# Patient Record
Sex: Male | Born: 1945 | Race: White | Hispanic: No | Marital: Married | Smoking: Current every day smoker
Health system: Southern US, Community
[De-identification: ages and names within clinical notes are randomized; demographics above are authoritative.]

## PROBLEM LIST (undated history)

## (undated) DIAGNOSIS — C9000 Multiple myeloma not having achieved remission: Secondary | ICD-10-CM

## (undated) DIAGNOSIS — I10 Essential (primary) hypertension: Secondary | ICD-10-CM

## (undated) DIAGNOSIS — I82409 Acute embolism and thrombosis of unspecified deep veins of unspecified lower extremity: Secondary | ICD-10-CM

## (undated) DIAGNOSIS — C801 Malignant (primary) neoplasm, unspecified: Secondary | ICD-10-CM

## (undated) DIAGNOSIS — E119 Type 2 diabetes mellitus without complications: Secondary | ICD-10-CM

## (undated) DIAGNOSIS — C9030 Solitary plasmacytoma not having achieved remission: Secondary | ICD-10-CM

## (undated) DIAGNOSIS — J449 Chronic obstructive pulmonary disease, unspecified: Secondary | ICD-10-CM

## (undated) HISTORY — PX: LIMBAL STEM CELL TRANSPLANT: SHX1969

## (undated) HISTORY — PX: TONSILLECTOMY: SUR1361

## (undated) HISTORY — DX: Chronic obstructive pulmonary disease, unspecified: J44.9

## (undated) HISTORY — DX: Solitary plasmacytoma not having achieved remission: C90.30

## (undated) HISTORY — PX: OTHER SURGICAL HISTORY: SHX169

## (undated) HISTORY — DX: Multiple myeloma not having achieved remission: C90.00

## (undated) HISTORY — DX: Acute embolism and thrombosis of unspecified deep veins of unspecified lower extremity: I82.409

## (undated) HISTORY — DX: Type 2 diabetes mellitus without complications: E11.9

## (undated) HISTORY — DX: Essential (primary) hypertension: I10

---

## 2002-02-10 DIAGNOSIS — C9030 Solitary plasmacytoma not having achieved remission: Secondary | ICD-10-CM

## 2002-02-10 HISTORY — DX: Solitary plasmacytoma not having achieved remission: C90.30

## 2006-11-11 ENCOUNTER — Ambulatory Visit: Payer: Self-pay | Admitting: Oncology

## 2006-12-10 ENCOUNTER — Ambulatory Visit: Payer: Self-pay | Admitting: Oncology

## 2006-12-12 ENCOUNTER — Ambulatory Visit: Payer: Self-pay | Admitting: Oncology

## 2007-03-14 ENCOUNTER — Ambulatory Visit: Payer: Self-pay | Admitting: Oncology

## 2007-04-11 ENCOUNTER — Ambulatory Visit: Payer: Self-pay | Admitting: Oncology

## 2007-10-06 ENCOUNTER — Ambulatory Visit: Payer: Self-pay | Admitting: Oncology

## 2007-10-12 ENCOUNTER — Ambulatory Visit: Payer: Self-pay | Admitting: Oncology

## 2008-03-13 ENCOUNTER — Ambulatory Visit: Payer: Self-pay | Admitting: Oncology

## 2008-03-29 ENCOUNTER — Ambulatory Visit: Payer: Self-pay | Admitting: Oncology

## 2008-04-10 ENCOUNTER — Ambulatory Visit: Payer: Self-pay | Admitting: Oncology

## 2008-08-10 ENCOUNTER — Ambulatory Visit: Payer: Self-pay | Admitting: Oncology

## 2008-08-28 ENCOUNTER — Ambulatory Visit: Payer: Self-pay | Admitting: Oncology

## 2008-09-10 ENCOUNTER — Ambulatory Visit: Payer: Self-pay | Admitting: Oncology

## 2009-02-11 LAB — HM COLONOSCOPY: HM COLON: NORMAL

## 2009-07-11 ENCOUNTER — Ambulatory Visit: Payer: Self-pay | Admitting: Oncology

## 2009-07-20 ENCOUNTER — Ambulatory Visit: Payer: Self-pay | Admitting: Family Medicine

## 2009-07-23 ENCOUNTER — Ambulatory Visit: Payer: Self-pay | Admitting: Gastroenterology

## 2009-07-26 ENCOUNTER — Ambulatory Visit: Payer: Self-pay | Admitting: Oncology

## 2009-07-31 ENCOUNTER — Inpatient Hospital Stay: Payer: Self-pay | Admitting: Oncology

## 2009-08-06 ENCOUNTER — Inpatient Hospital Stay: Payer: Self-pay | Admitting: Oncology

## 2009-08-10 ENCOUNTER — Ambulatory Visit: Payer: Self-pay | Admitting: Oncology

## 2009-08-29 LAB — PATHOLOGY REPORT

## 2009-09-03 ENCOUNTER — Ambulatory Visit: Payer: Self-pay | Admitting: Cardiovascular Disease

## 2009-09-03 ENCOUNTER — Inpatient Hospital Stay: Payer: Self-pay | Admitting: Oncology

## 2009-09-10 ENCOUNTER — Ambulatory Visit: Payer: Self-pay | Admitting: Oncology

## 2009-09-14 ENCOUNTER — Ambulatory Visit: Payer: Self-pay | Admitting: Oncology

## 2009-09-23 ENCOUNTER — Inpatient Hospital Stay: Payer: Self-pay | Admitting: Oncology

## 2009-10-03 ENCOUNTER — Inpatient Hospital Stay: Payer: Self-pay | Admitting: Internal Medicine

## 2009-10-03 ENCOUNTER — Ambulatory Visit: Payer: Self-pay | Admitting: Cardiovascular Disease

## 2009-10-10 ENCOUNTER — Ambulatory Visit: Payer: Self-pay | Admitting: Cardiovascular Disease

## 2009-10-11 ENCOUNTER — Ambulatory Visit: Payer: Self-pay | Admitting: Internal Medicine

## 2009-10-11 ENCOUNTER — Ambulatory Visit: Payer: Self-pay | Admitting: Oncology

## 2009-10-17 ENCOUNTER — Inpatient Hospital Stay: Payer: Self-pay | Admitting: Internal Medicine

## 2009-11-02 ENCOUNTER — Emergency Department: Payer: Self-pay | Admitting: Emergency Medicine

## 2009-11-10 ENCOUNTER — Ambulatory Visit: Payer: Self-pay | Admitting: Oncology

## 2009-11-10 ENCOUNTER — Ambulatory Visit: Payer: Self-pay | Admitting: Internal Medicine

## 2009-11-19 ENCOUNTER — Ambulatory Visit: Payer: Self-pay | Admitting: Oncology

## 2009-12-11 ENCOUNTER — Ambulatory Visit: Payer: Self-pay | Admitting: Oncology

## 2010-01-10 ENCOUNTER — Ambulatory Visit: Payer: Self-pay | Admitting: Oncology

## 2010-02-10 ENCOUNTER — Ambulatory Visit: Payer: Self-pay | Admitting: Oncology

## 2010-02-10 HISTORY — PX: OTHER SURGICAL HISTORY: SHX169

## 2010-02-10 HISTORY — PX: ILEOSTOMY CLOSURE: SHX1784

## 2010-03-13 ENCOUNTER — Ambulatory Visit: Payer: Self-pay | Admitting: Oncology

## 2010-04-11 ENCOUNTER — Ambulatory Visit: Payer: Self-pay | Admitting: Oncology

## 2010-04-25 ENCOUNTER — Inpatient Hospital Stay: Payer: Self-pay | Admitting: Internal Medicine

## 2010-05-12 ENCOUNTER — Ambulatory Visit: Payer: Self-pay | Admitting: Oncology

## 2010-05-17 ENCOUNTER — Inpatient Hospital Stay: Payer: Self-pay | Admitting: Surgery

## 2010-05-30 ENCOUNTER — Emergency Department: Payer: Self-pay | Admitting: *Deleted

## 2010-06-11 ENCOUNTER — Ambulatory Visit: Payer: Self-pay | Admitting: Oncology

## 2010-07-12 ENCOUNTER — Ambulatory Visit: Payer: Self-pay | Admitting: Oncology

## 2010-07-18 ENCOUNTER — Ambulatory Visit: Payer: Self-pay | Admitting: Surgery

## 2010-07-25 ENCOUNTER — Inpatient Hospital Stay: Payer: Self-pay | Admitting: Surgery

## 2010-07-29 LAB — PATHOLOGY REPORT

## 2010-08-02 ENCOUNTER — Other Ambulatory Visit: Payer: Self-pay | Admitting: Surgery

## 2010-08-05 ENCOUNTER — Ambulatory Visit: Payer: Self-pay | Admitting: Surgery

## 2010-08-07 ENCOUNTER — Ambulatory Visit: Payer: Self-pay | Admitting: Surgery

## 2010-08-11 ENCOUNTER — Ambulatory Visit: Payer: Self-pay | Admitting: Oncology

## 2010-09-11 ENCOUNTER — Ambulatory Visit: Payer: Self-pay | Admitting: Oncology

## 2010-10-09 ENCOUNTER — Other Ambulatory Visit: Payer: Self-pay | Admitting: Neurosurgery

## 2010-10-09 ENCOUNTER — Ambulatory Visit
Admission: RE | Admit: 2010-10-09 | Discharge: 2010-10-09 | Disposition: A | Discharge status: Home / Self Care | Payer: Medicare Other | Source: Ambulatory Visit | Attending: Neurosurgery | Admitting: Neurosurgery

## 2010-10-09 DIAGNOSIS — M549 Dorsalgia, unspecified: Secondary | ICD-10-CM

## 2010-10-12 ENCOUNTER — Ambulatory Visit: Payer: Self-pay | Admitting: Oncology

## 2010-11-11 ENCOUNTER — Ambulatory Visit: Payer: Self-pay | Admitting: Oncology

## 2010-12-12 ENCOUNTER — Ambulatory Visit: Payer: Self-pay | Admitting: Oncology

## 2011-01-11 ENCOUNTER — Ambulatory Visit: Payer: Self-pay | Admitting: Oncology

## 2011-02-09 ENCOUNTER — Inpatient Hospital Stay: Payer: Self-pay | Admitting: Surgery

## 2011-02-11 ENCOUNTER — Ambulatory Visit: Payer: Self-pay | Admitting: Oncology

## 2011-02-11 LAB — BASIC METABOLIC PANEL
BUN: 20 mg/dL — ABNORMAL HIGH (ref 7–18)
Creatinine: 1.02 mg/dL (ref 0.60–1.30)
EGFR (Non-African Amer.): >60
Sodium: 141 mmol/L (ref 136–145)

## 2011-02-11 LAB — PROTIME-INR: INR: 1.6

## 2011-02-11 LAB — CBC WITH DIFFERENTIAL/PLATELET
Bands: 20 %
Basophil #: 0 10*3/uL (ref 0.0–0.1)
Eosinophil #: 0 10*3/uL (ref 0.0–0.7)
Eosinophil: 1 %
HCT: 30.3 % — ABNORMAL LOW (ref 40.0–52.0)
Lymphocyte #: 1.5 10*3/uL (ref 1.0–3.6)
MCHC: 32.4 g/dL (ref 32.0–36.0)
MCV: 99 fL (ref 80–100)
Monocytes: 21 %
Neutrophil #: 1.5 10*3/uL (ref 1.4–6.5)
Neutrophil %: 38.3 %
RBC: 3.06 10*6/uL — ABNORMAL LOW (ref 4.40–5.90)
RDW: 18.1 % — ABNORMAL HIGH (ref 11.5–14.5)
Segmented Neutrophils: 16 %

## 2011-02-14 LAB — PROTIME-INR
INR: 2.4
Prothrombin Time: 25.6 secs — ABNORMAL HIGH (ref 11.5–14.7)

## 2011-02-21 LAB — PROTIME-INR: Prothrombin Time: 22.3 secs — ABNORMAL HIGH (ref 11.5–14.7)

## 2011-03-07 LAB — CBC CANCER CENTER
Basophil #: 0 x10 3/mm (ref 0.0–0.1)
Eosinophil %: 12 %
HCT: 33.1 % — ABNORMAL LOW (ref 40.0–52.0)
HGB: 11 g/dL — ABNORMAL LOW (ref 13.0–18.0)
Lymphocyte #: 1.7 x10 3/mm (ref 1.0–3.6)
Lymphocyte %: 28.3 %
MCV: 98 fL (ref 80–100)
Monocyte %: 7.8 %
Neutrophil #: 3.1 x10 3/mm (ref 1.4–6.5)
RBC: 3.37 10*6/uL — ABNORMAL LOW (ref 4.40–5.90)
RDW: 18 % — ABNORMAL HIGH (ref 11.5–14.5)
WBC: 6 x10 3/mm (ref 3.8–10.6)

## 2011-03-07 LAB — BASIC METABOLIC PANEL
Calcium, Total: 9 mg/dL (ref 8.5–10.1)
Creatinine: 0.82 mg/dL (ref 0.60–1.30)
EGFR (African American): >60
EGFR (Non-African Amer.): >60
Glucose: 103 mg/dL — ABNORMAL HIGH (ref 65–99)
Osmolality: 285 (ref 275–301)
Potassium: 3.3 mmol/L — ABNORMAL LOW (ref 3.5–5.1)
Sodium: 143 mmol/L (ref 136–145)

## 2011-03-10 LAB — PROT IMMUNOELECTROPHORES(ARMC)

## 2011-03-11 ENCOUNTER — Ambulatory Visit: Payer: Self-pay | Admitting: Surgery

## 2011-03-14 ENCOUNTER — Ambulatory Visit: Payer: Self-pay | Admitting: Oncology

## 2011-03-14 LAB — PROTIME-INR
INR: 2.2
Prothrombin Time: 25 secs — ABNORMAL HIGH (ref 11.5–14.7)

## 2011-03-21 LAB — PROTIME-INR
INR: 2.6
Prothrombin Time: 28.3 secs — ABNORMAL HIGH (ref 11.5–14.7)

## 2011-03-28 LAB — PROTIME-INR: INR: 2.4

## 2011-04-04 LAB — CBC CANCER CENTER
Basophil #: 0 x10 3/mm (ref 0.0–0.1)
Eosinophil #: 0.2 x10 3/mm (ref 0.0–0.7)
Eosinophil %: 4.2 %
HCT: 32.6 % — ABNORMAL LOW (ref 40.0–52.0)
HGB: 11 g/dL — ABNORMAL LOW (ref 13.0–18.0)
Lymphocyte #: 1.9 x10 3/mm (ref 1.0–3.6)
MCH: 33 pg (ref 26.0–34.0)
MCHC: 33.9 g/dL (ref 32.0–36.0)
MCV: 97 fL (ref 80–100)
Monocyte #: 0.4 x10 3/mm (ref 0.0–0.7)
Neutrophil #: 3.2 x10 3/mm (ref 1.4–6.5)
Neutrophil %: 56 %
RDW: 18.7 % — ABNORMAL HIGH (ref 11.5–14.5)

## 2011-04-04 LAB — BASIC METABOLIC PANEL
BUN: 17 mg/dL (ref 7–18)
Calcium, Total: 8.2 mg/dL — ABNORMAL LOW (ref 8.5–10.1)
Chloride: 102 mmol/L (ref 98–107)
Co2: 29 mmol/L (ref 21–32)
Creatinine: 1.04 mg/dL (ref 0.60–1.30)
Glucose: 141 mg/dL — ABNORMAL HIGH (ref 65–99)
Osmolality: 289 (ref 275–301)
Potassium: 3.4 mmol/L — ABNORMAL LOW (ref 3.5–5.1)
Sodium: 143 mmol/L (ref 136–145)

## 2011-04-04 LAB — PROTIME-INR
INR: 2.5
Prothrombin Time: 27.2 secs — ABNORMAL HIGH (ref 11.5–14.7)

## 2011-04-11 ENCOUNTER — Ambulatory Visit: Payer: Self-pay | Admitting: Oncology

## 2011-04-11 LAB — PROTIME-INR
INR: 1.6
Prothrombin Time: 19.1 secs — ABNORMAL HIGH (ref 11.5–14.7)

## 2011-04-18 LAB — PROTIME-INR: Prothrombin Time: 16.8 secs — ABNORMAL HIGH (ref 11.5–14.7)

## 2011-04-25 LAB — PROTIME-INR: INR: 1.6

## 2011-05-02 LAB — CBC CANCER CENTER
Bands: 10 %
Basophil #: 0 x10 3/mm (ref 0.0–0.1)
Basophil: 1 %
HCT: 32 % — ABNORMAL LOW (ref 40.0–52.0)
Lymphocyte #: 1.9 x10 3/mm (ref 1.0–3.6)
MCH: 33.4 pg (ref 26.0–34.0)
MCHC: 33.6 g/dL (ref 32.0–36.0)
MCV: 99 fL (ref 80–100)
Metamyelocyte: 2 %
Monocyte %: 7.1 %
Monocytes: 5 %
Neutrophil #: 2.5 x10 3/mm (ref 1.4–6.5)
Neutrophil %: 52 %
RDW: 18.7 % — ABNORMAL HIGH (ref 11.5–14.5)
Segmented Neutrophils: 38 %
WBC: 4.8 x10 3/mm (ref 3.8–10.6)

## 2011-05-02 LAB — BASIC METABOLIC PANEL
Anion Gap: 7 (ref 7–16)
BUN: 17 mg/dL (ref 7–18)
Chloride: 102 mmol/L (ref 98–107)
EGFR (African American): >60
EGFR (Non-African Amer.): >60
Glucose: 169 mg/dL — ABNORMAL HIGH (ref 65–99)
Osmolality: 285 (ref 275–301)
Potassium: 3.2 mmol/L — ABNORMAL LOW (ref 3.5–5.1)

## 2011-05-02 LAB — PROTIME-INR: INR: 1.7

## 2011-05-05 LAB — PROT IMMUNOELECTROPHORES(ARMC)

## 2011-05-12 ENCOUNTER — Ambulatory Visit: Payer: Self-pay | Admitting: Oncology

## 2011-05-16 LAB — CBC CANCER CENTER
Basophil #: 0 x10 3/mm (ref 0.0–0.1)
Basophil %: 0.2 %
Eosinophil #: 0.3 x10 3/mm (ref 0.0–0.7)
HCT: 35 % — ABNORMAL LOW (ref 40.0–52.0)
Lymphocyte #: 1.6 x10 3/mm (ref 1.0–3.6)
Lymphocyte %: 28.6 %
MCHC: 34.5 g/dL (ref 32.0–36.0)
MCV: 99 fL (ref 80–100)
Monocyte #: 0.5 x10 3/mm (ref 0.0–0.7)
Monocyte %: 9 %
Neutrophil %: 57.5 %
Platelet: 96 x10 3/mm — ABNORMAL LOW (ref 150–440)
RBC: 3.55 10*6/uL — ABNORMAL LOW (ref 4.40–5.90)
WBC: 5.4 x10 3/mm (ref 3.8–10.6)

## 2011-05-16 LAB — COMPREHENSIVE METABOLIC PANEL
Anion Gap: 13 (ref 7–16)
BUN: 16 mg/dL (ref 7–18)
Bilirubin,Total: 0.6 mg/dL (ref 0.2–1.0)
Calcium, Total: 8.6 mg/dL (ref 8.5–10.1)
Co2: 26 mmol/L (ref 21–32)
EGFR (Non-African Amer.): >60
Glucose: 334 mg/dL — ABNORMAL HIGH (ref 65–99)
SGPT (ALT): 38 U/L
Sodium: 137 mmol/L (ref 136–145)

## 2011-05-30 LAB — CBC CANCER CENTER
Basophil #: 0 x10 3/mm (ref 0.0–0.1)
Basophil %: 1.4 %
Eosinophil #: 0.1 x10 3/mm (ref 0.0–0.7)
HCT: 31.1 % — ABNORMAL LOW (ref 40.0–52.0)
HGB: 10.3 g/dL — ABNORMAL LOW (ref 13.0–18.0)
Lymphocyte %: 44.6 %
MCH: 33.1 pg (ref 26.0–34.0)
MCV: 100 fL (ref 80–100)
Monocyte #: 0.3 x10 3/mm (ref 0.2–1.0)
Monocyte %: 9.3 %
Neutrophil #: 1.5 x10 3/mm (ref 1.4–6.5)
WBC: 3.5 x10 3/mm — ABNORMAL LOW (ref 3.8–10.6)

## 2011-05-30 LAB — BASIC METABOLIC PANEL
Anion Gap: 8 (ref 7–16)
BUN: 17 mg/dL (ref 7–18)
Calcium, Total: 8.8 mg/dL (ref 8.5–10.1)
Co2: 31 mmol/L (ref 21–32)
Creatinine: 1.12 mg/dL (ref 0.60–1.30)
EGFR (African American): >60
EGFR (Non-African Amer.): >60
Glucose: 195 mg/dL — ABNORMAL HIGH (ref 65–99)
Osmolality: 292 (ref 275–301)

## 2011-06-02 LAB — PROT IMMUNOELECTROPHORES(ARMC)

## 2011-06-11 ENCOUNTER — Ambulatory Visit: Payer: Self-pay | Admitting: Oncology

## 2011-06-27 LAB — CBC CANCER CENTER
Basophil %: 0.4 %
Eosinophil %: 1.3 %
HCT: 34 % — ABNORMAL LOW (ref 40.0–52.0)
Lymphocyte #: 1.6 x10 3/mm (ref 1.0–3.6)
Lymphocyte %: 39.7 %
MCV: 101 fL — ABNORMAL HIGH (ref 80–100)
Monocyte #: 0.4 x10 3/mm (ref 0.2–1.0)
Monocyte %: 8.8 %
Neutrophil #: 2.1 x10 3/mm (ref 1.4–6.5)
RBC: 3.38 10*6/uL — ABNORMAL LOW (ref 4.40–5.90)
RDW: 16 % — ABNORMAL HIGH (ref 11.5–14.5)

## 2011-06-27 LAB — BASIC METABOLIC PANEL
Calcium, Total: 8.4 mg/dL — ABNORMAL LOW (ref 8.5–10.1)
Co2: 30 mmol/L (ref 21–32)
EGFR (Non-African Amer.): >60
Glucose: 152 mg/dL — ABNORMAL HIGH (ref 65–99)
Osmolality: 291 (ref 275–301)
Potassium: 3.3 mmol/L — ABNORMAL LOW (ref 3.5–5.1)
Sodium: 143 mmol/L (ref 136–145)

## 2011-07-12 ENCOUNTER — Ambulatory Visit: Payer: Self-pay | Admitting: Oncology

## 2011-07-25 LAB — CBC CANCER CENTER
Basophil #: 0 x10 3/mm (ref 0.0–0.1)
Basophil %: 0.2 %
Eosinophil %: 1.4 %
HCT: 34.6 % — ABNORMAL LOW (ref 40.0–52.0)
HGB: 11.8 g/dL — ABNORMAL LOW (ref 13.0–18.0)
Lymphocyte #: 1.5 x10 3/mm (ref 1.0–3.6)
Lymphocyte %: 37.2 %
MCH: 34.7 pg — ABNORMAL HIGH (ref 26.0–34.0)
MCHC: 34.1 g/dL (ref 32.0–36.0)
MCV: 102 fL — ABNORMAL HIGH (ref 80–100)
Monocyte #: 0.4 x10 3/mm (ref 0.2–1.0)
Monocyte %: 9.9 %
Neutrophil #: 2.1 x10 3/mm (ref 1.4–6.5)
Platelet: 150 x10 3/mm (ref 150–440)
RBC: 3.4 10*6/uL — ABNORMAL LOW (ref 4.40–5.90)
WBC: 4 x10 3/mm (ref 3.8–10.6)

## 2011-07-25 LAB — BASIC METABOLIC PANEL
Anion Gap: 7 (ref 7–16)
BUN: 19 mg/dL — ABNORMAL HIGH (ref 7–18)
Chloride: 105 mmol/L (ref 98–107)
Co2: 30 mmol/L (ref 21–32)
Creatinine: 1 mg/dL (ref 0.60–1.30)
EGFR (African American): >60
EGFR (Non-African Amer.): >60
Osmolality: 289 (ref 275–301)
Sodium: 142 mmol/L (ref 136–145)

## 2011-07-28 LAB — PROT IMMUNOELECTROPHORES(ARMC)

## 2011-08-11 ENCOUNTER — Ambulatory Visit: Payer: Self-pay | Admitting: Oncology

## 2011-08-22 LAB — BASIC METABOLIC PANEL
Anion Gap: 9 (ref 7–16)
BUN: 20 mg/dL — ABNORMAL HIGH (ref 7–18)
Calcium, Total: 9 mg/dL (ref 8.5–10.1)
Co2: 29 mmol/L (ref 21–32)
Creatinine: 1.02 mg/dL (ref 0.60–1.30)
EGFR (African American): >60
EGFR (Non-African Amer.): >60
Glucose: 137 mg/dL — ABNORMAL HIGH (ref 65–99)
Osmolality: 286 (ref 275–301)
Sodium: 141 mmol/L (ref 136–145)

## 2011-08-22 LAB — CBC CANCER CENTER
Basophil #: 0 x10 3/mm (ref 0.0–0.1)
Eosinophil #: 0.1 x10 3/mm (ref 0.0–0.7)
Eosinophil %: 1.4 %
Lymphocyte #: 1.7 x10 3/mm (ref 1.0–3.6)
MCH: 33.3 pg (ref 26.0–34.0)
MCHC: 33.1 g/dL (ref 32.0–36.0)
MCV: 101 fL — ABNORMAL HIGH (ref 80–100)
Neutrophil #: 2.8 x10 3/mm (ref 1.4–6.5)
Neutrophil %: 55.1 %
Platelet: 167 x10 3/mm (ref 150–440)
RBC: 3.6 10*6/uL — ABNORMAL LOW (ref 4.40–5.90)
RDW: 14.3 % (ref 11.5–14.5)
WBC: 5 x10 3/mm (ref 3.8–10.6)

## 2011-09-11 ENCOUNTER — Ambulatory Visit: Payer: Self-pay | Admitting: Oncology

## 2011-09-19 LAB — CBC CANCER CENTER
Basophil #: 0 x10 3/mm (ref 0.0–0.1)
Eosinophil %: 1.2 %
HCT: 38.5 % — ABNORMAL LOW (ref 40.0–52.0)
Lymphocyte #: 1.8 x10 3/mm (ref 1.0–3.6)
Lymphocyte %: 29 %
MCV: 100 fL (ref 80–100)
Monocyte %: 11.8 %
Neutrophil #: 3.6 x10 3/mm (ref 1.4–6.5)
Neutrophil %: 57.9 %
RBC: 3.87 10*6/uL — ABNORMAL LOW (ref 4.40–5.90)
RDW: 14 % (ref 11.5–14.5)
WBC: 6.2 x10 3/mm (ref 3.8–10.6)

## 2011-09-19 LAB — BASIC METABOLIC PANEL
Anion Gap: 8 (ref 7–16)
BUN: 23 mg/dL — ABNORMAL HIGH (ref 7–18)
Calcium, Total: 10.1 mg/dL (ref 8.5–10.1)
Co2: 31 mmol/L (ref 21–32)
Creatinine: 1.1 mg/dL (ref 0.60–1.30)
EGFR (African American): >60
EGFR (Non-African Amer.): >60
Glucose: 103 mg/dL — ABNORMAL HIGH (ref 65–99)
Sodium: 142 mmol/L (ref 136–145)

## 2011-09-22 LAB — URINE IEP, RANDOM

## 2011-10-12 ENCOUNTER — Ambulatory Visit: Payer: Self-pay | Admitting: Oncology

## 2011-10-31 LAB — CBC CANCER CENTER
Basophil #: 0 x10 3/mm (ref 0.0–0.1)
Eosinophil #: 0.1 x10 3/mm (ref 0.0–0.7)
HCT: 38.3 % — ABNORMAL LOW (ref 40.0–52.0)
HGB: 13.1 g/dL (ref 13.0–18.0)
Lymphocyte #: 2.1 x10 3/mm (ref 1.0–3.6)
MCH: 33.8 pg (ref 26.0–34.0)
MCHC: 34.1 g/dL (ref 32.0–36.0)
Monocyte #: 0.8 x10 3/mm (ref 0.2–1.0)
Neutrophil %: 58.6 %
RBC: 3.87 10*6/uL — ABNORMAL LOW (ref 4.40–5.90)

## 2011-11-03 LAB — PROT IMMUNOELECTROPHORES(ARMC)

## 2011-11-11 ENCOUNTER — Ambulatory Visit: Payer: Self-pay | Admitting: Oncology

## 2011-12-19 ENCOUNTER — Ambulatory Visit: Payer: Self-pay | Admitting: Oncology

## 2011-12-19 LAB — CBC CANCER CENTER
Basophil #: 0 x10 3/mm (ref 0.0–0.1)
Eosinophil #: 0.1 x10 3/mm (ref 0.0–0.7)
Eosinophil %: 1.5 %
HCT: 42.8 % (ref 40.0–52.0)
MCH: 33.1 pg (ref 26.0–34.0)
Monocyte %: 8 %
Neutrophil #: 3.5 x10 3/mm (ref 1.4–6.5)
Neutrophil %: 61.9 %
Platelet: 170 x10 3/mm (ref 150–440)
RBC: 4.31 10*6/uL — ABNORMAL LOW (ref 4.40–5.90)
RDW: 13.6 % (ref 11.5–14.5)
WBC: 5.6 x10 3/mm (ref 3.8–10.6)

## 2011-12-19 LAB — BASIC METABOLIC PANEL
BUN: 22 mg/dL — ABNORMAL HIGH (ref 7–18)
Co2: 29 mmol/L (ref 21–32)
Creatinine: 1.12 mg/dL (ref 0.60–1.30)
EGFR (African American): >60
EGFR (Non-African Amer.): >60
Glucose: 188 mg/dL — ABNORMAL HIGH (ref 65–99)
Potassium: 3.5 mmol/L (ref 3.5–5.1)
Sodium: 141 mmol/L (ref 136–145)

## 2012-01-11 ENCOUNTER — Ambulatory Visit: Payer: Self-pay | Admitting: Oncology

## 2012-03-13 ENCOUNTER — Ambulatory Visit: Payer: Self-pay | Admitting: Oncology

## 2012-03-16 ENCOUNTER — Ambulatory Visit: Payer: Self-pay | Admitting: Ophthalmology

## 2012-03-19 LAB — BASIC METABOLIC PANEL
Anion Gap: 4 — ABNORMAL LOW (ref 7–16)
BUN: 27 mg/dL — ABNORMAL HIGH (ref 7–18)
Calcium, Total: 9 mg/dL (ref 8.5–10.1)
Chloride: 103 mmol/L (ref 98–107)
Co2: 33 mmol/L — ABNORMAL HIGH (ref 21–32)
Creatinine: 1.22 mg/dL (ref 0.60–1.30)
EGFR (Non-African Amer.): >60
Glucose: 209 mg/dL — ABNORMAL HIGH (ref 65–99)
Osmolality: 291 (ref 275–301)
Sodium: 140 mmol/L (ref 136–145)

## 2012-03-19 LAB — CBC CANCER CENTER
Basophil #: 0 x10 3/mm (ref 0.0–0.1)
Basophil %: 0.3 %
Eosinophil #: 0.1 x10 3/mm (ref 0.0–0.7)
HGB: 14.4 g/dL (ref 13.0–18.0)
Lymphocyte #: 1.4 x10 3/mm (ref 1.0–3.6)
Lymphocyte %: 25 %
MCHC: 35.1 g/dL (ref 32.0–36.0)
Monocyte #: 0.5 x10 3/mm (ref 0.2–1.0)
Neutrophil %: 63.8 %
RBC: 4.25 10*6/uL — ABNORMAL LOW (ref 4.40–5.90)
RDW: 13.6 % (ref 11.5–14.5)
WBC: 5.8 x10 3/mm (ref 3.8–10.6)

## 2012-03-22 LAB — PROT IMMUNOELECTROPHORES(ARMC)

## 2012-03-24 ENCOUNTER — Ambulatory Visit: Payer: Self-pay | Admitting: Ophthalmology

## 2012-04-10 ENCOUNTER — Ambulatory Visit: Payer: Self-pay | Admitting: Oncology

## 2012-04-14 ENCOUNTER — Ambulatory Visit: Payer: Self-pay | Admitting: Internal Medicine

## 2012-06-23 ENCOUNTER — Ambulatory Visit: Payer: Self-pay | Admitting: Oncology

## 2012-06-25 LAB — CBC CANCER CENTER
Eosinophil #: 0.1 x10 3/mm (ref 0.0–0.7)
HCT: 40 % (ref 40.0–52.0)
HGB: 13.7 g/dL (ref 13.0–18.0)
Lymphocyte #: 1.3 x10 3/mm (ref 1.0–3.6)
Lymphocyte %: 24 %
MCH: 32.7 pg (ref 26.0–34.0)
MCHC: 34.2 g/dL (ref 32.0–36.0)
MCV: 96 fL (ref 80–100)
Monocyte #: 0.6 x10 3/mm (ref 0.2–1.0)
Monocyte %: 11.6 %
Neutrophil #: 3.3 x10 3/mm (ref 1.4–6.5)
Platelet: 172 x10 3/mm (ref 150–440)
RBC: 4.19 10*6/uL — ABNORMAL LOW (ref 4.40–5.90)

## 2012-06-25 LAB — BASIC METABOLIC PANEL
Anion Gap: 9 (ref 7–16)
BUN: 26 mg/dL — ABNORMAL HIGH (ref 7–18)
Chloride: 102 mmol/L (ref 98–107)
EGFR (African American): >60
Glucose: 125 mg/dL — ABNORMAL HIGH (ref 65–99)
Potassium: 3.6 mmol/L (ref 3.5–5.1)
Sodium: 138 mmol/L (ref 136–145)

## 2012-06-28 LAB — KAPPA/LAMBDA FREE LIGHT CHAINS (ARMC)

## 2012-06-28 LAB — URINE IEP, RANDOM

## 2012-07-11 ENCOUNTER — Ambulatory Visit: Payer: Self-pay | Admitting: Oncology

## 2012-09-10 ENCOUNTER — Ambulatory Visit: Payer: Self-pay | Admitting: Oncology

## 2012-09-28 LAB — CBC CANCER CENTER
Basophil %: 0.3 %
Eosinophil %: 1.6 %
Lymphocyte #: 1.5 x10 3/mm (ref 1.0–3.6)
Lymphocyte %: 31.5 %
Monocyte #: 0.6 x10 3/mm (ref 0.2–1.0)
Neutrophil %: 54.7 %
Platelet: 155 x10 3/mm (ref 150–440)
RBC: 4.34 10*6/uL — ABNORMAL LOW (ref 4.40–5.90)
RDW: 13.9 % (ref 11.5–14.5)

## 2012-09-28 LAB — BASIC METABOLIC PANEL
Anion Gap: 6 — ABNORMAL LOW (ref 7–16)
BUN: 24 mg/dL — ABNORMAL HIGH (ref 7–18)
Calcium, Total: 8.7 mg/dL (ref 8.5–10.1)
Chloride: 102 mmol/L (ref 98–107)
Co2: 29 mmol/L (ref 21–32)
EGFR (African American): >60
Sodium: 137 mmol/L (ref 136–145)

## 2012-09-29 LAB — PROT IMMUNOELECTROPHORES(ARMC)

## 2012-09-29 LAB — KAPPA/LAMBDA FREE LIGHT CHAINS (ARMC)

## 2012-10-06 LAB — URINE IEP, RANDOM

## 2012-10-11 ENCOUNTER — Ambulatory Visit: Payer: Self-pay | Admitting: Oncology

## 2012-11-11 LAB — PSA: PSA: 1.5

## 2012-11-11 LAB — LIPID PANEL
Cholesterol: 141 mg/dL (ref 0–200)
HDL: 35 mg/dL (ref 35–70)
LDL Cholesterol: 75 mg/dL
Triglycerides: 150 mg/dL (ref 40–160)

## 2012-11-19 ENCOUNTER — Ambulatory Visit: Payer: Self-pay | Admitting: Oncology

## 2012-11-19 LAB — CBC CANCER CENTER
Basophil #: 0 x10 3/mm (ref 0.0–0.1)
Eosinophil %: 4.6 %
HCT: 42.3 % (ref 40.0–52.0)
Lymphocyte #: 1.3 x10 3/mm (ref 1.0–3.6)
Lymphocyte %: 30.3 %
MCH: 33.2 pg (ref 26.0–34.0)
MCHC: 34.3 g/dL (ref 32.0–36.0)
MCV: 97 fL (ref 80–100)
Monocyte #: 0.7 x10 3/mm (ref 0.2–1.0)
Monocyte %: 15.4 %
Neutrophil #: 2.1 x10 3/mm (ref 1.4–6.5)
Neutrophil %: 49.4 %
RBC: 4.37 10*6/uL — ABNORMAL LOW (ref 4.40–5.90)
RDW: 14.3 % (ref 11.5–14.5)
WBC: 4.3 x10 3/mm (ref 3.8–10.6)

## 2012-11-19 LAB — BASIC METABOLIC PANEL
Chloride: 99 mmol/L (ref 98–107)
Co2: 30 mmol/L (ref 21–32)
Creatinine: 1.29 mg/dL (ref 0.60–1.30)
Glucose: 225 mg/dL — ABNORMAL HIGH (ref 65–99)

## 2012-11-22 LAB — KAPPA/LAMBDA FREE LIGHT CHAINS (ARMC)

## 2012-12-11 ENCOUNTER — Ambulatory Visit: Payer: Self-pay | Admitting: Oncology

## 2013-01-14 ENCOUNTER — Ambulatory Visit: Payer: Self-pay | Admitting: Oncology

## 2013-01-14 LAB — CBC CANCER CENTER
Basophil #: 0 x10 3/mm (ref 0.0–0.1)
Eosinophil #: 0.4 x10 3/mm (ref 0.0–0.7)
Eosinophil %: 8.8 %
HCT: 41.8 % (ref 40.0–52.0)
HGB: 14.2 g/dL (ref 13.0–18.0)
Lymphocyte #: 1.5 x10 3/mm (ref 1.0–3.6)
Lymphocyte %: 30.1 %
MCH: 33.6 pg (ref 26.0–34.0)
Monocyte #: 0.5 x10 3/mm (ref 0.2–1.0)
Monocyte %: 10.3 %
Neutrophil #: 2.5 x10 3/mm (ref 1.4–6.5)
Platelet: 151 x10 3/mm (ref 150–440)
RDW: 16 % — ABNORMAL HIGH (ref 11.5–14.5)

## 2013-01-14 LAB — BASIC METABOLIC PANEL
BUN: 23 mg/dL — ABNORMAL HIGH (ref 7–18)
Calcium, Total: 8.7 mg/dL (ref 8.5–10.1)
Chloride: 102 mmol/L (ref 98–107)
Co2: 31 mmol/L (ref 21–32)
EGFR (African American): >60
EGFR (Non-African Amer.): >60
Glucose: 179 mg/dL — ABNORMAL HIGH (ref 65–99)
Osmolality: 288 (ref 275–301)

## 2013-01-17 LAB — PROT IMMUNOELECTROPHORES(ARMC)

## 2013-01-24 LAB — URINE IEP, RANDOM

## 2013-02-10 ENCOUNTER — Ambulatory Visit: Payer: Self-pay | Admitting: Oncology

## 2013-02-17 ENCOUNTER — Inpatient Hospital Stay: Payer: Self-pay | Admitting: Internal Medicine

## 2013-02-17 LAB — CK TOTAL AND CKMB (NOT AT ARMC)
CK, Total: 54 U/L (ref 35–232)
CK-MB: 2.5 ng/mL (ref 0.5–3.6)

## 2013-02-17 LAB — COMPREHENSIVE METABOLIC PANEL
ANION GAP: 7 (ref 7–16)
Albumin: 2.8 g/dL — ABNORMAL LOW (ref 3.4–5.0)
Alkaline Phosphatase: 73 U/L
BUN: 21 mg/dL — AB (ref 7–18)
Bilirubin,Total: 1.6 mg/dL — ABNORMAL HIGH (ref 0.2–1.0)
CALCIUM: 8.9 mg/dL (ref 8.5–10.1)
CREATININE: 1.48 mg/dL — AB (ref 0.60–1.30)
Chloride: 100 mmol/L (ref 98–107)
Co2: 28 mmol/L (ref 21–32)
EGFR (African American): 56 — ABNORMAL LOW
EGFR (Non-African Amer.): 48 — ABNORMAL LOW
Glucose: 347 mg/dL — ABNORMAL HIGH (ref 65–99)
Osmolality: 287 (ref 275–301)
POTASSIUM: 2.9 mmol/L — AB (ref 3.5–5.1)
SGOT(AST): 22 U/L (ref 15–37)
SGPT (ALT): 43 U/L (ref 12–78)
Sodium: 135 mmol/L — ABNORMAL LOW (ref 136–145)
TOTAL PROTEIN: 7.5 g/dL (ref 6.4–8.2)

## 2013-02-17 LAB — CBC
HCT: 40.2 % (ref 40.0–52.0)
HGB: 13.7 g/dL (ref 13.0–18.0)
MCH: 33.8 pg (ref 26.0–34.0)
MCHC: 34 g/dL (ref 32.0–36.0)
MCV: 99 fL (ref 80–100)
PLATELETS: 142 10*3/uL — AB (ref 150–440)
RBC: 4.05 10*6/uL — ABNORMAL LOW (ref 4.40–5.90)
RDW: 15.8 % — ABNORMAL HIGH (ref 11.5–14.5)
WBC: 3.8 10*3/uL (ref 3.8–10.6)

## 2013-02-17 LAB — TROPONIN I: Troponin-I: <0.02 ng/mL

## 2013-02-18 LAB — CBC WITH DIFFERENTIAL/PLATELET
Basophil #: 0 10*3/uL (ref 0.0–0.1)
Basophil %: 0.2 %
EOS PCT: 0.2 %
Eosinophil #: 0 10*3/uL (ref 0.0–0.7)
HCT: 35.4 % — ABNORMAL LOW (ref 40.0–52.0)
HGB: 12.2 g/dL — ABNORMAL LOW (ref 13.0–18.0)
LYMPHS ABS: 0.3 10*3/uL — AB (ref 1.0–3.6)
Lymphocyte %: 13.5 %
MCH: 34 pg (ref 26.0–34.0)
MCHC: 34.5 g/dL (ref 32.0–36.0)
MCV: 99 fL (ref 80–100)
Monocyte #: 0.4 x10 3/mm (ref 0.2–1.0)
Monocyte %: 15.9 %
Neutrophil #: 1.6 10*3/uL (ref 1.4–6.5)
Neutrophil %: 70.2 %
PLATELETS: 137 10*3/uL — AB (ref 150–440)
RBC: 3.59 10*6/uL — AB (ref 4.40–5.90)
RDW: 15.9 % — ABNORMAL HIGH (ref 11.5–14.5)
WBC: 2.3 10*3/uL — ABNORMAL LOW (ref 3.8–10.6)

## 2013-02-18 LAB — BASIC METABOLIC PANEL
Anion Gap: 4 — ABNORMAL LOW (ref 7–16)
BUN: 23 mg/dL — ABNORMAL HIGH (ref 7–18)
CHLORIDE: 108 mmol/L — AB (ref 98–107)
CREATININE: 1.05 mg/dL (ref 0.60–1.30)
Calcium, Total: 8.6 mg/dL (ref 8.5–10.1)
Co2: 27 mmol/L (ref 21–32)
EGFR (African American): >60
Glucose: 337 mg/dL — ABNORMAL HIGH (ref 65–99)
Osmolality: 294 (ref 275–301)
POTASSIUM: 4.2 mmol/L (ref 3.5–5.1)
SODIUM: 139 mmol/L (ref 136–145)

## 2013-03-13 ENCOUNTER — Ambulatory Visit: Payer: Self-pay | Admitting: Oncology

## 2013-03-24 ENCOUNTER — Ambulatory Visit: Payer: Self-pay | Admitting: Oncology

## 2013-03-25 LAB — CBC CANCER CENTER
Basophil #: 0 x10 3/mm (ref 0.0–0.1)
Basophil %: 0.7 %
Eosinophil #: 0.5 x10 3/mm (ref 0.0–0.7)
Eosinophil %: 7.8 %
HCT: 38.7 % — ABNORMAL LOW (ref 40.0–52.0)
HGB: 12.8 g/dL — ABNORMAL LOW (ref 13.0–18.0)
LYMPHS ABS: 1.4 x10 3/mm (ref 1.0–3.6)
Lymphocyte %: 22.5 %
MCH: 33.5 pg (ref 26.0–34.0)
MCHC: 33 g/dL (ref 32.0–36.0)
MCV: 102 fL — AB (ref 80–100)
MONOS PCT: 11 %
Monocyte #: 0.7 x10 3/mm (ref 0.2–1.0)
NEUTROS ABS: 3.5 x10 3/mm (ref 1.4–6.5)
NEUTROS PCT: 58 %
Platelet: 114 x10 3/mm — ABNORMAL LOW (ref 150–440)
RBC: 3.82 10*6/uL — AB (ref 4.40–5.90)
RDW: 17 % — ABNORMAL HIGH (ref 11.5–14.5)
WBC: 6 x10 3/mm (ref 3.8–10.6)

## 2013-03-25 LAB — BASIC METABOLIC PANEL
ANION GAP: 8 (ref 7–16)
BUN: 18 mg/dL (ref 7–18)
CALCIUM: 7.6 mg/dL — AB (ref 8.5–10.1)
CO2: 31 mmol/L (ref 21–32)
Chloride: 102 mmol/L (ref 98–107)
Creatinine: 1.17 mg/dL (ref 0.60–1.30)
EGFR (African American): >60
EGFR (Non-African Amer.): >60
GLUCOSE: 252 mg/dL — AB (ref 65–99)
OSMOLALITY: 292 (ref 275–301)
Potassium: 3.4 mmol/L — ABNORMAL LOW (ref 3.5–5.1)
Sodium: 141 mmol/L (ref 136–145)

## 2013-03-25 LAB — HEMOGLOBIN A1C: Hemoglobin A1C: 7.5 % — ABNORMAL HIGH (ref 4.2–6.3)

## 2013-03-28 LAB — PROT IMMUNOELECTROPHORES(ARMC)

## 2013-03-28 LAB — KAPPA/LAMBDA FREE LIGHT CHAINS (ARMC)

## 2013-04-10 ENCOUNTER — Ambulatory Visit: Payer: Self-pay | Admitting: Oncology

## 2013-05-26 ENCOUNTER — Ambulatory Visit: Payer: Self-pay | Admitting: Oncology

## 2013-05-27 LAB — BASIC METABOLIC PANEL
Anion Gap: 7 (ref 7–16)
BUN: 25 mg/dL — AB (ref 7–18)
CALCIUM: 9.4 mg/dL (ref 8.5–10.1)
CHLORIDE: 105 mmol/L (ref 98–107)
CO2: 31 mmol/L (ref 21–32)
Creatinine: 1.13 mg/dL (ref 0.60–1.30)
EGFR (African American): >60
EGFR (Non-African Amer.): >60
Glucose: 142 mg/dL — ABNORMAL HIGH (ref 65–99)
Osmolality: 292 (ref 275–301)
Potassium: 3.6 mmol/L (ref 3.5–5.1)
SODIUM: 143 mmol/L (ref 136–145)

## 2013-05-27 LAB — CBC CANCER CENTER
BASOS ABS: 0 x10 3/mm (ref 0.0–0.1)
BASOS PCT: 0.5 %
EOS ABS: 0.1 x10 3/mm (ref 0.0–0.7)
EOS PCT: 1.3 %
HCT: 42.9 % (ref 40.0–52.0)
HGB: 14.1 g/dL (ref 13.0–18.0)
LYMPHS PCT: 27.7 %
Lymphocyte #: 1.6 x10 3/mm (ref 1.0–3.6)
MCH: 33.8 pg (ref 26.0–34.0)
MCHC: 32.9 g/dL (ref 32.0–36.0)
MCV: 103 fL — AB (ref 80–100)
MONOS PCT: 9.4 %
Monocyte #: 0.5 x10 3/mm (ref 0.2–1.0)
NEUTROS PCT: 61.1 %
Neutrophil #: 3.6 x10 3/mm (ref 1.4–6.5)
PLATELETS: 174 x10 3/mm (ref 150–440)
RBC: 4.18 10*6/uL — AB (ref 4.40–5.90)
RDW: 14 % (ref 11.5–14.5)
WBC: 5.8 x10 3/mm (ref 3.8–10.6)

## 2013-05-30 LAB — KAPPA/LAMBDA FREE LIGHT CHAINS (ARMC)

## 2013-05-30 LAB — PROT IMMUNOELECTROPHORES(ARMC)

## 2013-06-10 ENCOUNTER — Ambulatory Visit: Payer: Self-pay | Admitting: Oncology

## 2013-06-13 LAB — URINE IEP, RANDOM

## 2013-07-19 ENCOUNTER — Ambulatory Visit: Payer: Self-pay | Admitting: Oncology

## 2013-07-19 LAB — CBC CANCER CENTER
Basophil #: 0 x10 3/mm (ref 0.0–0.1)
Basophil %: 0.2 %
EOS PCT: 1.7 %
Eosinophil #: 0.1 x10 3/mm (ref 0.0–0.7)
HCT: 45.7 % (ref 40.0–52.0)
HGB: 15.3 g/dL (ref 13.0–18.0)
LYMPHS ABS: 1.4 x10 3/mm (ref 1.0–3.6)
Lymphocyte %: 22 %
MCH: 33.1 pg (ref 26.0–34.0)
MCHC: 33.6 g/dL (ref 32.0–36.0)
MCV: 99 fL (ref 80–100)
Monocyte #: 0.6 x10 3/mm (ref 0.2–1.0)
Monocyte %: 9.6 %
Neutrophil #: 4.2 x10 3/mm (ref 1.4–6.5)
Neutrophil %: 66.5 %
PLATELETS: 164 x10 3/mm (ref 150–440)
RBC: 4.63 10*6/uL (ref 4.40–5.90)
RDW: 12.8 % (ref 11.5–14.5)
WBC: 6.3 x10 3/mm (ref 3.8–10.6)

## 2013-07-19 LAB — BASIC METABOLIC PANEL
ANION GAP: 6 — AB (ref 7–16)
BUN: 27 mg/dL — ABNORMAL HIGH (ref 7–18)
Calcium, Total: 9.2 mg/dL (ref 8.5–10.1)
Chloride: 103 mmol/L (ref 98–107)
Co2: 32 mmol/L (ref 21–32)
Creatinine: 1.05 mg/dL (ref 0.60–1.30)
EGFR (Non-African Amer.): >60
GLUCOSE: 129 mg/dL — AB (ref 65–99)
OSMOLALITY: 288 (ref 275–301)
POTASSIUM: 3.9 mmol/L (ref 3.5–5.1)
Sodium: 141 mmol/L (ref 136–145)

## 2013-07-20 LAB — PROT IMMUNOELECTROPHORES(ARMC)

## 2013-07-20 LAB — KAPPA/LAMBDA FREE LIGHT CHAINS (ARMC)

## 2013-07-21 LAB — URINE IEP, RANDOM

## 2013-08-10 ENCOUNTER — Ambulatory Visit: Payer: Self-pay | Admitting: Oncology

## 2013-09-05 LAB — COMPREHENSIVE METABOLIC PANEL
ANION GAP: 5 — AB (ref 7–16)
Albumin: 3.5 g/dL (ref 3.4–5.0)
Alkaline Phosphatase: 62 U/L
BILIRUBIN TOTAL: 0.4 mg/dL (ref 0.2–1.0)
BUN: 26 mg/dL — AB (ref 7–18)
CO2: 32 mmol/L (ref 21–32)
CREATININE: 1.15 mg/dL (ref 0.60–1.30)
Calcium, Total: 9 mg/dL (ref 8.5–10.1)
Chloride: 102 mmol/L (ref 98–107)
EGFR (African American): >60
Glucose: 198 mg/dL — ABNORMAL HIGH (ref 65–99)
OSMOLALITY: 288 (ref 275–301)
POTASSIUM: 3.8 mmol/L (ref 3.5–5.1)
SGOT(AST): 19 U/L (ref 15–37)
SGPT (ALT): 24 U/L
Sodium: 139 mmol/L (ref 136–145)
Total Protein: 7.4 g/dL (ref 6.4–8.2)

## 2013-09-05 LAB — CBC CANCER CENTER
Basophil #: 0 x10 3/mm (ref 0.0–0.1)
Basophil %: 0.2 %
EOS PCT: 1.9 %
Eosinophil #: 0.1 x10 3/mm (ref 0.0–0.7)
HCT: 42.5 % (ref 40.0–52.0)
HGB: 14.4 g/dL (ref 13.0–18.0)
LYMPHS ABS: 1.4 x10 3/mm (ref 1.0–3.6)
LYMPHS PCT: 22.4 %
MCH: 33.3 pg (ref 26.0–34.0)
MCHC: 33.9 g/dL (ref 32.0–36.0)
MCV: 98 fL (ref 80–100)
MONO ABS: 0.6 x10 3/mm (ref 0.2–1.0)
Monocyte %: 10 %
NEUTROS PCT: 65.5 %
Neutrophil #: 4 x10 3/mm (ref 1.4–6.5)
Platelet: 163 x10 3/mm (ref 150–440)
RBC: 4.32 10*6/uL — AB (ref 4.40–5.90)
RDW: 13.5 % (ref 11.5–14.5)
WBC: 6.2 x10 3/mm (ref 3.8–10.6)

## 2013-09-06 LAB — PROT IMMUNOELECTROPHORES(ARMC)

## 2013-09-07 LAB — URINE IEP, RANDOM

## 2013-09-10 ENCOUNTER — Ambulatory Visit: Payer: Self-pay | Admitting: Oncology

## 2013-10-11 ENCOUNTER — Ambulatory Visit: Payer: Self-pay | Admitting: Oncology

## 2013-10-24 LAB — CBC CANCER CENTER
BASOS ABS: 0 x10 3/mm (ref 0.0–0.1)
BASOS PCT: 0.3 %
EOS PCT: 2.6 %
Eosinophil #: 0.1 x10 3/mm (ref 0.0–0.7)
HCT: 47.9 % (ref 40.0–52.0)
HGB: 16.2 g/dL (ref 13.0–18.0)
LYMPHS PCT: 24.4 %
Lymphocyte #: 1.3 x10 3/mm (ref 1.0–3.6)
MCH: 33.6 pg (ref 26.0–34.0)
MCHC: 33.8 g/dL (ref 32.0–36.0)
MCV: 99 fL (ref 80–100)
MONO ABS: 0.6 x10 3/mm (ref 0.2–1.0)
Monocyte %: 11.2 %
NEUTROS ABS: 3.2 x10 3/mm (ref 1.4–6.5)
NEUTROS PCT: 61.5 %
Platelet: 172 x10 3/mm (ref 150–440)
RBC: 4.82 10*6/uL (ref 4.40–5.90)
RDW: 13.4 % (ref 11.5–14.5)
WBC: 5.2 x10 3/mm (ref 3.8–10.6)

## 2013-10-24 LAB — COMPREHENSIVE METABOLIC PANEL
Albumin: 3.7 g/dL (ref 3.4–5.0)
Alkaline Phosphatase: 74 U/L
Anion Gap: 4 — ABNORMAL LOW (ref 7–16)
BUN: 28 mg/dL — ABNORMAL HIGH (ref 7–18)
Bilirubin,Total: 0.4 mg/dL (ref 0.2–1.0)
CO2: 33 mmol/L — AB (ref 21–32)
CREATININE: 1.21 mg/dL (ref 0.60–1.30)
Calcium, Total: 9 mg/dL (ref 8.5–10.1)
Chloride: 101 mmol/L (ref 98–107)
Glucose: 226 mg/dL — ABNORMAL HIGH (ref 65–99)
Osmolality: 288 (ref 275–301)
POTASSIUM: 3.7 mmol/L (ref 3.5–5.1)
SGOT(AST): 23 U/L (ref 15–37)
SGPT (ALT): 30 U/L
Sodium: 138 mmol/L (ref 136–145)
TOTAL PROTEIN: 8.1 g/dL (ref 6.4–8.2)

## 2013-10-26 LAB — URINE IEP, RANDOM

## 2013-10-27 LAB — PROT IMMUNOELECTROPHORES(ARMC)

## 2013-11-10 ENCOUNTER — Ambulatory Visit: Payer: Self-pay | Admitting: Oncology

## 2013-12-05 LAB — CBC CANCER CENTER
BASOS PCT: 0.3 %
Basophil #: 0 x10 3/mm (ref 0.0–0.1)
EOS PCT: 1.7 %
Eosinophil #: 0.1 x10 3/mm (ref 0.0–0.7)
HCT: 43.6 % (ref 40.0–52.0)
HGB: 14.6 g/dL (ref 13.0–18.0)
LYMPHS ABS: 1.2 x10 3/mm (ref 1.0–3.6)
Lymphocyte %: 24 %
MCH: 33.3 pg (ref 26.0–34.0)
MCHC: 33.4 g/dL (ref 32.0–36.0)
MCV: 100 fL (ref 80–100)
MONOS PCT: 10.6 %
Monocyte #: 0.5 x10 3/mm (ref 0.2–1.0)
NEUTROS ABS: 3.1 x10 3/mm (ref 1.4–6.5)
Neutrophil %: 63.4 %
Platelet: 148 x10 3/mm — ABNORMAL LOW (ref 150–440)
RBC: 4.37 10*6/uL — AB (ref 4.40–5.90)
RDW: 13.3 % (ref 11.5–14.5)
WBC: 4.9 x10 3/mm (ref 3.8–10.6)

## 2013-12-05 LAB — COMPREHENSIVE METABOLIC PANEL
ALBUMIN: 3.5 g/dL (ref 3.4–5.0)
ALK PHOS: 61 U/L
ANION GAP: 5 — AB (ref 7–16)
AST: 17 U/L (ref 15–37)
BUN: 34 mg/dL — ABNORMAL HIGH (ref 7–18)
Bilirubin,Total: 0.4 mg/dL (ref 0.2–1.0)
CALCIUM: 8.8 mg/dL (ref 8.5–10.1)
CHLORIDE: 103 mmol/L (ref 98–107)
Co2: 32 mmol/L (ref 21–32)
Creatinine: 1.27 mg/dL (ref 0.60–1.30)
EGFR (African American): >60
GFR CALC NON AF AMER: 60 — AB
Glucose: 184 mg/dL — ABNORMAL HIGH (ref 65–99)
Osmolality: 292 (ref 275–301)
Potassium: 3.8 mmol/L (ref 3.5–5.1)
SGPT (ALT): 26 U/L
SODIUM: 140 mmol/L (ref 136–145)
TOTAL PROTEIN: 7.6 g/dL (ref 6.4–8.2)

## 2013-12-06 LAB — PROT IMMUNOELECTROPHORES(ARMC)

## 2013-12-07 LAB — URINE IEP, RANDOM

## 2013-12-11 ENCOUNTER — Ambulatory Visit: Payer: Self-pay | Admitting: Oncology

## 2013-12-27 LAB — HEMOGLOBIN A1C: Hgb A1c MFr Bld: 6 % (ref 4.0–6.0)

## 2014-01-10 ENCOUNTER — Ambulatory Visit: Payer: Self-pay | Admitting: Oncology

## 2014-01-25 ENCOUNTER — Ambulatory Visit: Payer: Self-pay | Admitting: Internal Medicine

## 2014-02-27 ENCOUNTER — Ambulatory Visit: Payer: Self-pay | Admitting: Oncology

## 2014-02-27 LAB — CBC CANCER CENTER
BASOS ABS: 0 x10 3/mm (ref 0.0–0.1)
Basophil %: 0.1 %
Eosinophil #: 0.1 x10 3/mm (ref 0.0–0.7)
Eosinophil %: 1.1 %
HCT: 43.5 % (ref 40.0–52.0)
HGB: 14.5 g/dL (ref 13.0–18.0)
LYMPHS PCT: 8.6 %
Lymphocyte #: 1 x10 3/mm (ref 1.0–3.6)
MCH: 33.3 pg (ref 26.0–34.0)
MCHC: 33.3 g/dL (ref 32.0–36.0)
MCV: 100 fL (ref 80–100)
MONOS PCT: 10.9 %
Monocyte #: 1.3 x10 3/mm — ABNORMAL HIGH (ref 0.2–1.0)
Neutrophil #: 9.2 x10 3/mm — ABNORMAL HIGH (ref 1.4–6.5)
Neutrophil %: 79.3 %
PLATELETS: 189 x10 3/mm (ref 150–440)
RBC: 4.34 10*6/uL — ABNORMAL LOW (ref 4.40–5.90)
RDW: 13.6 % (ref 11.5–14.5)
WBC: 11.6 x10 3/mm — ABNORMAL HIGH (ref 3.8–10.6)

## 2014-02-27 LAB — BASIC METABOLIC PANEL
Anion Gap: 5 — ABNORMAL LOW (ref 7–16)
BUN: 23 mg/dL — ABNORMAL HIGH (ref 7–18)
CALCIUM: 8.4 mg/dL — AB (ref 8.5–10.1)
CHLORIDE: 102 mmol/L (ref 98–107)
Co2: 30 mmol/L (ref 21–32)
Creatinine: 1.2 mg/dL (ref 0.60–1.30)
EGFR (African American): >60
EGFR (Non-African Amer.): >60
Glucose: 170 mg/dL — ABNORMAL HIGH (ref 65–99)
OSMOLALITY: 281 (ref 275–301)
POTASSIUM: 3.9 mmol/L (ref 3.5–5.1)
Sodium: 137 mmol/L (ref 136–145)

## 2014-02-28 LAB — PROT IMMUNOELECTROPHORES(ARMC)

## 2014-02-28 LAB — KAPPA/LAMBDA FREE LIGHT CHAINS (ARMC)

## 2014-03-01 LAB — URINE IEP, RANDOM

## 2014-03-08 ENCOUNTER — Emergency Department: Payer: Self-pay | Admitting: Emergency Medicine

## 2014-03-08 LAB — COMPREHENSIVE METABOLIC PANEL
ALBUMIN: 3.4 g/dL (ref 3.4–5.0)
ALT: 25 U/L (ref 14–63)
Alkaline Phosphatase: 76 U/L (ref 46–116)
Anion Gap: 4 — ABNORMAL LOW (ref 7–16)
BUN: 29 mg/dL — ABNORMAL HIGH (ref 7–18)
Bilirubin,Total: 0.3 mg/dL (ref 0.2–1.0)
CREATININE: 1.03 mg/dL (ref 0.60–1.30)
Calcium, Total: 8.7 mg/dL (ref 8.5–10.1)
Chloride: 102 mmol/L (ref 98–107)
Co2: 32 mmol/L (ref 21–32)
EGFR (African American): >60
Glucose: 83 mg/dL (ref 65–99)
OSMOLALITY: 281 (ref 275–301)
Potassium: 3.7 mmol/L (ref 3.5–5.1)
SGOT(AST): 29 U/L (ref 15–37)
SODIUM: 138 mmol/L (ref 136–145)
Total Protein: 8.4 g/dL — ABNORMAL HIGH (ref 6.4–8.2)

## 2014-03-08 LAB — CBC WITH DIFFERENTIAL/PLATELET
BASOS PCT: 0.2 %
Basophil #: 0 10*3/uL (ref 0.0–0.1)
EOS ABS: 0.1 10*3/uL (ref 0.0–0.7)
Eosinophil %: 1.8 %
HCT: 39.5 % — ABNORMAL LOW (ref 40.0–52.0)
HGB: 13.6 g/dL (ref 13.0–18.0)
LYMPHS PCT: 33.1 %
Lymphocyte #: 1.6 10*3/uL (ref 1.0–3.6)
MCH: 33.2 pg (ref 26.0–34.0)
MCHC: 34.4 g/dL (ref 32.0–36.0)
MCV: 97 fL (ref 80–100)
Monocyte #: 0.9 x10 3/mm (ref 0.2–1.0)
Monocyte %: 18.1 %
NEUTROS PCT: 46.8 %
Neutrophil #: 2.2 10*3/uL (ref 1.4–6.5)
Platelet: 174 10*3/uL (ref 150–440)
RBC: 4.09 10*6/uL — ABNORMAL LOW (ref 4.40–5.90)
RDW: 13.2 % (ref 11.5–14.5)
WBC: 4.8 10*3/uL (ref 3.8–10.6)

## 2014-03-13 ENCOUNTER — Ambulatory Visit: Payer: Self-pay | Admitting: Oncology

## 2014-03-14 ENCOUNTER — Ambulatory Visit: Payer: Self-pay | Admitting: Unknown Physician Specialty

## 2014-04-11 ENCOUNTER — Ambulatory Visit
Admit: 2014-04-11 | Disposition: A | Discharge status: Home / Self Care | Payer: Self-pay | Attending: Oncology | Admitting: Oncology

## 2014-05-12 ENCOUNTER — Ambulatory Visit
Admit: 2014-05-12 | Disposition: A | Discharge status: Home / Self Care | Payer: Self-pay | Attending: Oncology | Admitting: Oncology

## 2014-05-15 LAB — BASIC METABOLIC PANEL
ANION GAP: 5 — AB (ref 7–16)
BUN: 25 mg/dL — ABNORMAL HIGH
CALCIUM: 8.5 mg/dL — AB
CHLORIDE: 101 mmol/L
Co2: 28 mmol/L
Creatinine: 0.92 mg/dL
Glucose: 171 mg/dL — ABNORMAL HIGH
POTASSIUM: 3.6 mmol/L
Sodium: 134 mmol/L — ABNORMAL LOW

## 2014-05-15 LAB — CBC CANCER CENTER
Basophil #: 0 x10 3/mm (ref 0.0–0.1)
Basophil %: 0.2 %
EOS ABS: 0.4 x10 3/mm (ref 0.0–0.7)
Eosinophil %: 7.5 %
HCT: 39.9 % — ABNORMAL LOW (ref 40.0–52.0)
HGB: 13.6 g/dL (ref 13.0–18.0)
LYMPHS ABS: 1.9 x10 3/mm (ref 1.0–3.6)
LYMPHS PCT: 34.4 %
MCH: 33 pg (ref 26.0–34.0)
MCHC: 34.1 g/dL (ref 32.0–36.0)
MCV: 97 fL (ref 80–100)
MONOS PCT: 11 %
Monocyte #: 0.6 x10 3/mm (ref 0.2–1.0)
Neutrophil #: 2.5 x10 3/mm (ref 1.4–6.5)
Neutrophil %: 46.9 %
PLATELETS: 155 x10 3/mm (ref 150–440)
RBC: 4.12 10*6/uL — ABNORMAL LOW (ref 4.40–5.90)
RDW: 15.1 % — ABNORMAL HIGH (ref 11.5–14.5)
WBC: 5.4 x10 3/mm (ref 3.8–10.6)

## 2014-05-15 LAB — CREATININE, SERUM: Creatine, Serum: 0.92

## 2014-05-17 LAB — URINE IEP, RANDOM

## 2014-05-17 LAB — PROT IMMUNOELECTROPHORES(ARMC)

## 2014-06-02 NOTE — Op Note (Signed)
PATIENT NAME:  Russell Keller, Russell Keller MR#:  [id number] DATE OF BIRTH:  [birth year]  DATE OF PROCEDURE:  03/24/2012  PREOPERATIVE DIAGNOSIS:  Senile cataract right eye.  POSTOPERATIVE DIAGNOSIS:  Senile cataract right eye.  PROCEDURE:  Phacoemulsification with posterior chamber intraocular lens implantation of the right eye.  LENS:  ZCB00 22.0-diopter posterior chamber intraocular lens.  ULTRASOUND TIME:  15% of 1 minute, 27 seconds.  CDE 13.4.   SURGEON:  Mali Erickson Yamashiro, MD  ANESTHESIA:  Topical with tetracaine drops and 2% Xylocaine jelly.  COMPLICATIONS:  None.  DESCRIPTION OF PROCEDURE:  The patient was identified in the holding room and transported to the operating room and placed in the supine position under the operating microscope.  The right eye was identified as the operative eye and it was prepped and draped in the usual sterile ophthalmic fashion.  A 1 millimeter clear-corneal paracentesis was made at the 1:30 position.  The anterior chamber was filled with Viscoat viscoelastic.  A 2.4 millimeter keratome was used to make a near-clear corneal incision at the 10:30 position.  A curvilinear capsulorrhexis was made with a cystotome and capsulorrhexis forceps.  Balanced salt solution was used to hydrodissect and hydrodelineate the nucleus.  Phacoemulsification was then used in stop and chop fashion to remove the lens nucleus and epinucleus.  The remaining cortex was then removed using the irrigation and aspiration handpiece. Provisc was then placed into the capsular bag to distend it for lens placement.  A ZCB00 22.0 diopter lens was then injected into the capsular bag.  The remaining viscoelastic was aspirated.  Wounds were hydrated with balanced salt solution.  The anterior chamber was inflated to a physiologic pressure with balanced salt solution.  0.1 mL of cefuroxime 10 mg/mL were injected into the anterior chamber for a dose of 1 mg of intracameral antibiotic at the completion of the  case. Miostat was placed into the anterior chamber to constrict the pupil.  No wound leaks were noted.  Topical Vigamox drops and Maxitrol ointment were applied to the eye.  The patient was taken to the recovery room in stable condition without complications of anesthesia or surgery.   ____________________________ Wyonia Hough, MD crb:si D: 03/24/2012 13:50:35 ET T: 03/24/2012 15:52:45 ET JOB#: [id number]  cc: Wyonia Hough, MD, <Dictator> Leandrew Koyanagi MD ELECTRONICALLY SIGNED 03/31/2012 11:31

## 2014-06-03 NOTE — Discharge Summary (Signed)
PATIENT NAME:  Russell Keller, Russell Keller MR#:  [id number] DATE OF BIRTH:  [birth year]  DATE OF ADMISSION:  02/17/2013 DATE OF DISCHARGE:  02/18/2013  ADMISSION DIAGNOSIS:  1.  Acute respiratory failure secondary to acute chronic obstructive pulmonary disease expectoration.   DISCHARGE DIAGNOSES: 1. Acute respiratory failure secondary to acute chronic obstructive pulmonary disease exacerbation and pneumonia.  2.  Multiple myeloma.  3.  Diabetes.   CONSULTATIONS:  Dr. Grayland Ormond.   PERTINENT LABORATORIES AT DISCHARGE: White blood cells 2.3, hemoglobin 12.2, hematocrit 35.4, platelets 137. Sodium 139, potassium 4.2, chloride 108, bicarb 27, BUN 23, creatinine 1.05.   HOSPITAL COURSE: A 69 year old male who presented with shortness of breath, found to have acute COPD exacerbation. For further details, please refer to the H and P.   1.  Acute respiratory failure secondary to acute chronic obstructive pulmonary disease exacerbation as outlined below.  2.  Acute chronic obstructive pulmonary disease exacerbation. The patient was on IV steroids, as well as nebulizers and oxygen.  His lungs sound good today. He has some very mild wheezing. Good air flow. I also think he probably has community-acquired pneumonia. He was placed on Rocephin and azithromycin. He will be discharged with antibiotics as well.  3.  Community-acquired pneumonia, present on admission. After further questioning and review of the chest x-ray, it appeared that he probably does have pneumonia. The patient will receive antibiotics at discharge. He did receive pneumonia treatment while in the hospital as well.  4.  Tobacco patient. The patient was re-counseled to stop smoking.  5.  Acute renal failure which improved with intravenous fluids.  6.  Multiple myeloma. We appreciate Dr. Gary Fleet consult. The patient will follow up with Dr. Grayland Ormond in a week.  7.  Diabetes. The patient will continue his outpatient medications.   DISCHARGE  MEDICATIONS: 1.  Aspirin 81 mg daily.  2.  Valacyclovir 500 mg daily.  3.  Xopenex 2 puffs 4 times a day as needed. 4.  Prochlorperazine 10 mg q.6 hours p.r.n.  5.  Dexamethasone 4 mg 5 tablets on Mondays.  6.  Glimepiride 2 mg daily.  7.  Diazepam 2 mg at bedtime.  8.  Revlimid 10 mg daily.  9.  Neurontin 300 mg b.i.d.  10.  Omeprazole 20 mg daily.  11.  Prednisolone ophthalmic 1 drop b.i.d.  12.  Ambien 10 mg at bedtime.  13.  Prednisone taper starting at 60 mg, taper by 10 mg every 2 days.  14.  Zithromax 500 mg daily x 6 days.   DISCHARGE HOME HEALTH: With home health nurse.   DISCHARGE OXYGEN:  Two liters nasal cannula.   DISCHARGE DIET: ADA diet.   DISCHARGE ACTIVITY: As tolerated.   DISCHARGE FOLLOWUP:  With Dr. Grayland Ormond in 2 weeks and in 1 week with [facility].   The patient is medically stable for discharge.    TIME SPENT:  Approximately 35 minutes.   ____________________________ Russell Beers. Benjie Karvonen, MD spm:dmm D: 02/18/2013 15:59:04 ET T: 02/18/2013 19:36:17 ET JOB#: [id number]  cc: Russell Moxey P. Benjie Karvonen, MD, <Dictator> [name]. Grayland Ormond, MD [place] P Gavin Faivre MD ELECTRONICALLY SIGNED 02/18/2013 21:14

## 2014-06-03 NOTE — Consult Note (Signed)
History of Present Illness:  Reason for Consult Multiple myeloma on Revlimid, now with COPD exacerbation.   HPI   Patient recently admitted to the hospital after having increased shortness of breath for the past several days. He otherwise has felt well. Patient states his breathing has improved since admission to the hospital. He denies any fevers. He has a mild productive cough. He continues to tolerate Revlimid well.  He continues to have intermittent lower back pain, that is chronic and unchanged.  He denies any abdominal pain.  He denies any nausea, vomiting, constipation, or diarrhea.  He denies any easy bleeding or bruising.  Patient offers no further specific complaints today.  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    Family History: Negative and noncontributory.  Social History: Patient denies alcohol use. He does smoke cigarettes approximately 2 packs day for the past 48 years.   Review of Systems:  Performance Status (ECOG) 0   Review of Systems   As per HPI. Otherwise, 10 point system review was negative.   NURSING NOTES: **Vital Signs.:   09-Jan-15 07:52   Vital Signs Type: Q 4hr   Temperature Temperature (F): 97.8   Temperature Source: oral   Pulse Pulse: 86   Respirations Respirations: 20   Systolic BP Systolic BP: 283   Diastolic BP (mmHg) Diastolic BP (mmHg): 62   Mean BP: 79   Pulse Ox % Pulse Ox %: 93   Pulse Ox Activity Level: At rest   Oxygen Delivery: 3L   Physical Exam:  General no acute distress   Lungs: clear   Cardiac: regular rate, rhythm   Abdomen: soft  positive bowel sounds   Skin: intact   Extremities: No edema, rash or cyanosis   Neuro: AAOx3    No Known Allergies:     aspirin 81 mg oral tablet: 1 tab(s) orally once a day, Status: Active, Quantity: 0, Refills: None   valacyclovir 500 mg  oral tablet: 1 tab(s) orally once a day, Status: Active, Quantity: 0, Refills: None   dexamethasone 4 mg oral tablet: 5 tab(s) orally once a week on Monday, Status: Active, Quantity: 20, Refills: 5   Xopenex HFA CFC free 45 mcg/inh inhalation aerosol: 2 puff(s) inhaled every 4 hours, As Needed - for Shortness of Breath, Status: Active, Quantity: 0, Refills: None   ibuprofen 800 mg tablet: 1 tab(s) orally 3 times a day, As Needed - for Pain, Status: Active, Quantity: 90, Refills: 2   prochlorperazine 10 mg oral tablet: 1 tab(s) orally every 6 hours, As Needed - for Nausea, Vomiting, Status: Active, Quantity: 120, Refills: 1   diazepam 2 mg oral tablet: 1 tab(s) orally once a day (at bedtime), Status: Active, Quantity: 90, Refills: 3   Revlimid 10 mg oral capsule: 1 cap(s) orally once a day, Status: Active, Quantity: 30, Refills: 5   Neurontin 300 mg oral capsule: 1 cap(s) orally 2 times a day, Status: Active, Quantity: 90, Refills: 3   omeprazole 20 mg oral delayed release tablet: 1 tab(s) orally once a day, Status: Active, Quantity: 60, Refills: 5   prednisoLONE ophthalmic acetate 1% ophthalmic suspension: 1 drop(s) to right eye 2 times a day, Status: Active, Quantity: 0, Refills: None   zolpidem 10 mg oral tablet: 1 tab(s) orally once a day (at bedtime), Status: Active, Quantity: 30, Refills: 1   glimepiride 2 mg oral tablet: 1 tab(s) orally once a day,  Status: Active, Quantity: 0, Refills: None  Laboratory Results: Routine Chem:  09-Jan-15 07:00   Glucose, Serum  337  BUN  23  Creatinine (comp) 1.05  Sodium, Serum 139  Potassium, Serum 4.2  Chloride, Serum  108  CO2, Serum 27  Calcium (Total), Serum 8.6  Anion Gap  4  Osmolality (calc) 294  eGFR (African American) >60  eGFR (Non-African American) >60 (eGFR values <40m/min/1.73 m2 may be an indication of chronic kidney disease (CKD). Calculated eGFR is useful in patients with stable renal function. The eGFR calculation will  not be reliable in acutely ill patients when serum creatinine is changing rapidly. It is not useful in  patients on dialysis. The eGFR calculation may not be applicable to patients at the low and high extremes of body sizes, pregnant women, and vegetarians.)  Routine Hem:  09-Jan-15 07:00   WBC (CBC)  2.3  RBC (CBC)  3.59  Hemoglobin (CBC)  12.2  Hematocrit (CBC)  35.4  Platelet Count (CBC)  137  MCV 99  MCH 34.0  MCHC 34.5  RDW  15.9  Neutrophil % 70.2  Lymphocyte % 13.5  Monocyte % 15.9  Eosinophil % 0.2  Basophil % 0.2  Neutrophil # 1.6  Lymphocyte #  0.3  Monocyte # 0.4  Eosinophil # 0.0  Basophil # 0.0 (Result(s) reported on 18 Feb 2013 at 07:57AM.)   Assessment and Plan: Impression:   Multiple myeloma, COPD exacerbation. Plan:   1.  Multiple myeloma:  His M spike continues to trend down and is now 1.3. Continue Revlimid at a lower dose of 10 mg daily without days off.  Continue weekly dexamethasone as well.  He has no evidence of end-organ damage.  Bone survey was last completed on April 02, 2012 was essentially unchanged. Return to clinic as previously scheduled for laboratory work and further evaluation.   History of DVT/PE: He is no longer taking Coumadin.  Pain: Continue current narcotic regimen.   4.  COPD: Continue oxygen as needed as well as current antibiotics.  consult, call questions.  Electronic Signatures: FDelight Hoh(MD)  (Signed 09-Jan-15 12:45)  Authored: HISTORY OF PRESENT ILLNESS, PFSH, ROS, NURSING NOTES, PE, ALLERGIES, HOME MEDICATIONS, LABS, ASSESSMENT AND PLAN   Last Updated: 09-Jan-15 12:45 by FDelight Hoh(MD)

## 2014-06-03 NOTE — H&P (Signed)
PATIENT NAME:  Russell Keller, Russell Keller MR#:  [id number] DATE OF BIRTH:  01/23/1946  DATE OF ADMISSION:  02/17/2013  REFERRING PHYSICIAN: Dr. Eula Listen.    PRIMARY CARE PHYSICIAN: Dr. Halina Maidens at [facility].   PRIMARY ONCOLOGIST: Dr. Grayland Ormond.   CHIEF COMPLAINT: Cough, shortness of breath.   HISTORY OF PRESENT ILLNESS: This is a 69 year old male with significant past medical history of COPD not on home oxygen, history of multiple myeloma, plasmacytoma, stem cell transplant in 2005 and further, diabetes, hypertension, diverting colostomy. The patient presents with complaints of shortness of breath. The patient reports his symptoms have been going on for the last 2 to 3 days. As well, reports he has started to have productive cough with white sputum in color. Reports he has been having upper respiratory symptoms for the last week. Reports his shortness of breath got much worsen this morning which prompted him to come to the ED. The patient was hypoxic at 85% on room air, and he is not on home oxygen at baseline. He is still smoking 1 pack per day for the last 50 years. The patient is currently receiving treatment for his multiple myeloma on Decadron once every week. The patient's chest x-ray did not show any opacity or infiltrate. The patient was afebrile. No significant leukocytosis. Hospitalist service was requested to admit the patient for treatment of his COPD exacerbation. As well, the patient was noticed to be in acute renal failure. He was having normal creatinine at baseline. Today, it was 1.39. As well, he had significant hypokalemia at 2.9. The patient reported decreased p.o. intake and appetite over the last 1 to 2 days.   PAST MEDICAL HISTORY:  1. Multiple myeloma.  2. Plasmacytoma.  3. Stem cell transplant.  4. Diabetes.  5. Hypertension.  6. Diverting colostomy.   FAMILY HISTORY: Denies any coronary artery disease at a young age.   SOCIAL HISTORY: The patient denies any  alcohol or any illicit drug use. He still smokes 1 pack per day.   ALLERGIES: No known drug allergies.   HOME MEDICATIONS:  1. Aspirin 81 mg oral daily.  2. Dexamethasone 4 mg 5 tablets once a week every Monday.  3. Ibuprofen 100 mg daily as needed.  4. Diazepam 2 mg oral at bedtime.  5. Neurontin 300 mg oral 2 times a day.  6. Glyburide 2 mg daily.  7. Phenergan as needed.  8. Valacyclovir 500 mg oral daily.  9. Zolpidem 10 mg oral daily.  10. Xopenex as needed.  11. Revlimid 10 mg oral daily.  12. Prednisolone ophthalmic acetate solution 1% to the right eye 2 times a day.  13. Omeprazole 20 mg oral daily.   REVIEW OF SYSTEMS:  CONSTITUTIONAL: The patient reports feeling feverish, having chills, fatigue, weakness. Denies weight gain, weight loss.  EYES: Denies blurry vision, double vision, inflammation, glaucoma.  ENT: Denies tinnitus, ear pain, hearing loss, epistaxis. Reports runny nose and sore throat.  RESPIRATORY: Complains of cough, productive sputum white in color. Reports shortness of breath and COPD.   CARDIOVASCULAR: Denies chest pain, edema, arrhythmia, palpitation, syncope.  GASTROINTESTINAL: Denies nausea, vomiting, diarrhea, abdominal pain, GERD, jaundice.  GENITOURINARY: Denies dysuria, hematuria, renal colic.  ENDOCRINE: Denies polyuria, polydipsia, heat or cold intolerance.  HEMATOLOGY: Denies anemia, easy bruising, bleeding diathesis.  INTEGUMENTARY: Denies acne, rash or skin lesion.  MUSCULOSKELETAL: Denies any gout, swelling, arthritis.  NEUROLOGIC: Denies CVA, TIA, headache, vertigo, tremor.  PSYCHIATRIC: Denies anxiety, bipolar disorder or schizophrenia. Reports history of  insomnia.   PHYSICAL EXAMINATION:  VITAL SIGNS: Temperature 98.8, pulse 120, respiratory rate 24, blood pressure 125/68, saturating 95% on oxygen, 85% on room air.  GENERAL: Well-nourished male, looks comfortable in bed, in no apparent distress.  HEENT: Head atraumatic, normocephalic.  Pupils equal, reactive to light. Pink conjunctivae. Anicteric sclerae. Dry oral mucosa.  NECK: Supple. No thyromegaly. No JVD. No carotid bruits.  CHEST: Good air entry bilaterally with scattered wheezing and bibasilar rales. No rhonchi.  CARDIOVASCULAR: S1, S2 heard. No rubs, murmurs or gallops. Tachycardic but regular.  ABDOMEN: Soft, nontender, nondistended. Bowel sounds present.  EXTREMITIES: No edema. No clubbing. No cyanosis. Pedal pulses felt bilaterally.  PSYCHIATRIC: Appropriate affect. Awake, alert x 3. Intact judgment and insight.  NEUROLOGIC: Cranial nerves grossly intact. Motor 5 out of 5. No focal deficits.  SKIN: Dry, delayed skin turgor.  LYMPHATIC: No cervical lymphadenopathy could be appreciated.   PERTINENT LABORATORIES: Glucose 347, BUN 21,  sodium 135, potassium 2.9, chloride 100. Total protein 7.5, albumin 2.8, total bili 1.6, alk phos 73, AST 22, ALT 43. Troponin less than 0.02. CK total 54. White blood cells 3.8, hemoglobin 13.7, hematocrit 40.2, platelets 142.   EKG showing sinus tachycardia at 125 beats per minute.   IMAGING: Chest x-ray showing streaky opacities in the bases, more notable on the right, most consistent with atelectasis rather than pneumonia.   ASSESSMENT AND PLAN:  1. Acute hypoxic respiratory failure: The patient presents with saturating 85% on room air. This is most likely related to his chronic obstructive pulmonary disease exacerbation: Will keep him on as needed oxygen.  2. Chronic obstructive pulmonary disease exacerbation: The patient will be started on intravenous Solu-Medrol, DuoNebs every 6 hours and albuterol as needed. Will start him on intravenous vancomycin and Rocephin.  3. Tobacco abuse: The patient was counseled for 5 minutes. At this point, he does not want any nicotine patches.  4. Acute renal failure due to decreased p.o. intake: Will continue with intravenous fluids.  5. Hypokalemia: Will replace. Will recheck level in a.m.  6.  Multiple myeloma: Will continue the patient on Revlimid and acyclovir. He is already on steroids for his chronic obstructive pulmonary disease. Will consult the oncology service.  7. Diabetes: Will hold oral agents. Will have him on insulin sliding scale, especially since he is on large dose steroids.  8. Gastrointestinal prophylaxis: The patient is on proton pump inhibitor.  9. Deep venous thrombosis prophylaxis: Subcutaneous heparin.   CODE STATUS: Reports he has a Living Will. His wife is his healthcare power of attorney. He is a FULL CODE.   TOTAL TIME SPENT ON ADMISSION AND PATIENT CARE: 55 minutes.    ____________________________ Albertine Patricia, MD dse:gb D: 02/17/2013 17:43:00 ET T: 02/17/2013 21:10:04 ET JOB#: [id number]  cc: Albertine Patricia, MD, <Dictator> Eiko Mcgowen Graciela Husbands MD ELECTRONICALLY SIGNED 02/25/2013 0:13

## 2014-06-04 ENCOUNTER — Other Ambulatory Visit: Payer: Self-pay | Admitting: Oncology

## 2014-06-04 DIAGNOSIS — C9002 Multiple myeloma in relapse: Secondary | ICD-10-CM

## 2014-06-04 NOTE — Discharge Summary (Signed)
PATIENT NAME:  Russell Keller, Russell Keller MR#:  [id number] DATE OF BIRTH:  1945/03/14  DATE OF ADMISSION:  02/09/2011 DATE OF DISCHARGE:  02/12/2011  PRINCIPAL DIAGNOSIS: High-grade partial distal small bowel obstruction.   ADDITIONAL DIAGNOSES:  1. 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  2. Status post ileostomy takedown June 2012.  3. 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  4. Plasmacytoma of right shoulder.  5. Chronic obstructive pulmonary disease.  6. Diet-controlled type 2 diabetes.  7. 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  8. Hypertension.  PRINCIPLE PROCEDURE PERFORMED DURING THIS ADMISSION: None.   HOSPITAL COURSE: Mr. Maxcy was admitted to the hospital and had nasogastric suction. As his coags were corrected and he became hungry. He was passing lots of flatus and had two large bowel movements and was therefore discharged home.    ____________________________ Consuela Mimes, MD wfm:bjt D: 03/08/2011 11:01:11 ET T: 03/08/2011 11:45:56 ET JOB#: [id number]  cc: Consuela Mimes, MD, <Dictator> Consuela Mimes MD ELECTRONICALLY SIGNED 03/09/2011 17:48

## 2014-06-04 NOTE — H&P (Signed)
PATIENT NAME:  Russell, Keller MR#:  [id number] DATE OF BIRTH:  Sep 09, 1945  DATE OF ADMISSION:  02/09/2011  HISTORY OF PRESENT ILLNESS: Russell Keller is a 69 year old white male who has had multiple pelvic operations in 2012 who had a normal bowel movement four days ago and passed gas normally at that time. He has not had a bowel movement since. He is still passing a little bit of flatus, but it is much less than before. Two days ago he began having dark green vomiting and mid abdominal pain that is described as lasting only about 5 seconds, is crampy and dull. He has not had any fever or chills. His vomiting has not been bloody. He says prior to this (and since his previous surgery in June), he has had a bowel movement every day to every three days.   PAST MEDICAL HISTORY:  1. Multiple myeloma status post bone marrow transplant 2006 with history of radiation on chronic Revlimid and dexamethasone therapy.  2. Plasmacytoma of right shoulder.  3. Chronic obstructive pulmonary disease.  4. Diet-controlled type 2 diabetes.  5. History of deep venous thrombosis in the right upper extremity.  6. Hypertension.  7. 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  8. Status post ileostomy takedown 07/25/2010.   SOCIAL HISTORY: The patient is married and lives at home with his wife. No one else lives at home with him. He used to smoke 1 pack of cigarettes per day, but recently has been smoking a half pack of cigarettes per day. He does not drink alcohol. He is a retired Veterinary surgeon.   FAMILY HISTORY: Noncontributory.   REVIEW OF SYSTEMS: Negative for 10 systems except as mentioned above in the history of present illness. Specifically, he does not have any hematemesis and did not have any genitourinary symptoms or bright red blood per rectum or fever.   ALLERGIES: None.   MEDICATIONS:  1. Aspirin 81 mg  daily.  2. Dexamethasone 40 mg every Monday for three out of four weeks (while on Revlimid). 3. Diazepam 2 mg t.i.d.  4. Metoprolol 12.5 mg daily.  5. Morphine extended release 15 mg q.12 hours. 6. Morphine immediate release 30 mg q.6h. p.r.n. (typically takes b.i.d.)  7. Neurontin 300 mg b.i.d.  8. Omeprazole 20 mg daily.  9. Revlimid 25 mg daily x21 days on, 7 days off (today is day 14 of his 21 day cycle). 10. Coumadin 5 mg daily.   PHYSICAL EXAMINATION:  GENERAL: Healthy, middle-aged male who has episodes of nausea and is wearing a mask on the Emergency Room stretcher. Height 5 feet, 9 inches, weight 162 pounds, BMI 23.9.   VITAL SIGNS: Temperature 97.7, pulse 108, respirations 18, blood pressure 145/93 and oxygen saturation 94% on room air.   HEENT: Pupils equally round and reactive to light. Extraocular movements intact. Sclerae anicteric. Oropharynx clear. Mucous membranes moist.   NECK: Supple with no thyroid tenderness and the trachea is midline. There is no jugular venous distention.   HEART: Regular rate and rhythm with no murmurs or rubs.   LUNGS: Clear to auscultation with normal respiratory effort.   ABDOMEN: Mildly distended, but not tympanitic. The patient initially had tenderness to touch of the skin but with further examination, it was obvious that the patient was nontender at least with no involuntary guarding or rebound tenderness. There is a well-healed midline scar as well as ileostomy site scar in the right  lower quadrant.   EXTREMITIES: No edema with normal capillary refill bilaterally.   NEUROLOGIC: Cranial nerves II through XII, motor and sensation grossly intact.   PSYCHIATRIC: Alert and oriented x4. Appropriate affect.   LABORATORY, RADIOLOGICAL AND DIAGNOSTIC DATA: Two days ago, the PT/INR was 25.3 and 2.4 respectively. Laboratories have not been drawn in the Emergency Department yet, but abdominal three-way x-ray shows many, many loops of dilated small  intestine mostly containing gas going down to the pelvis. There is some gas in the left colon as well and that appears nondilated.   ASSESSMENT: High grade distal partial small bowel obstruction.   PLAN: Admit to the hospital for nasogastric suction, IV fluid hydration, check coags and possibly correct his coags as he may need surgery for this during this admission.   ____________________________ Consuela Mimes, MD wfm:ap D: 02/09/2011 16:13:56 ET T: 02/10/2011 07:53:17 ET JOB#: [id number]  cc: Consuela Mimes, MD, <Dictator> [name]. Grayland Ormond, MD Consuela Mimes MD ELECTRONICALLY SIGNED 02/12/2011 14:18

## 2014-06-05 DIAGNOSIS — C9030 Solitary plasmacytoma not having achieved remission: Secondary | ICD-10-CM | POA: Insufficient documentation

## 2014-06-05 DIAGNOSIS — I82409 Acute embolism and thrombosis of unspecified deep veins of unspecified lower extremity: Secondary | ICD-10-CM | POA: Insufficient documentation

## 2014-06-05 DIAGNOSIS — C9000 Multiple myeloma not having achieved remission: Secondary | ICD-10-CM | POA: Insufficient documentation

## 2014-06-11 NOTE — Consult Note (Signed)
Reason for Visit: This 69 year old Male patient presents to the clinic for initial evaluation of  multiple myeloma .   Referred by Dr. Grayland Ormond.  Diagnosis:  Chief Complaint/Diagnosis   69 year old male with known multiple myeloma now with increasing right hip pain an MRI evidence of significant myelomatous involvement of right hip and inferior pubic ramus.  Imaging Report MRI scans and plain films reviewed   Referral Report clinical notes reviewed   Planned Treatment Regimen palliative radiation   HPI   patient is a 69 year old male well known to our department haven't been treated back in 2011 to his lumbar spine and SI joints for myelomatous involvement. 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 He recently is having difficulty ambulating with right hip pain worsening.MRI scan confirmed progressive changes in the right hemipelvis consistent with multiple myelomatous involvement with tumor outside the bone and probable probable pathologic fracture of the inferior pubic ramus. He is seen today for evaluation.his M spike has slowly trended up and he has begun or agreed to reinitiate Revlimid.patient is an excellent palliation of his lower back from previous radiation in 2011.  Past Hx:    hx of DVT:    Diabetes: diet controlled   HTN:    COPD:    Plasmocytoma:    Multiple Myeloma:    colon, bladder fistula repair:    tonsillectomy:    bone marrow transplant:   Past, Family and Social History:  Past Medical History positive   Cardiovascular hypertension   Respiratory COPD   Endocrine diabetes mellitus   Past Surgical History tonsillectomy, color bladder fistula repair   Past Medical History Comments DVT, bone marrow transplant, multiple myeloma plasmacytoma   Family History noncontributory   Social History positive   Social History Comments  100-pack-year smoking history and continues to smoke 2 packs per day, no EtOH abuse history   Allergies:   No Known Allergies:   Home Meds:  Home Medications: Medication Instructions Status  cyclobenzaprine 5 mg oral tablet 1 tab(s) orally every 8 hours, As Needed - for Pain Active  lenalidomide 10 mg oral capsule 1 cap(s) orally once a day Active  dexamethasone 4 mg oral tablet 5 tab(s) orally once a week on Monday Active  PriLOSEC 20 mg oral delayed release capsule 1 cap(s) orally once a day Active  Neurontin 300 mg oral capsule 1 cap(s) orally 2 times a day Active  ibuprofen 800 mg oral tablet 1 tab(s) orally 3 times a day Active  omeprazole 20 mg oral delayed release tablet 1 tab(s) orally once a day Active  diazepam 2 mg oral tablet 1 tab(s) orally once a day (at bedtime) Active  Xopenex HFA CFC free 45 mcg/inh inhalation aerosol 2 puff(s) inhaled every 4 hours, As Needed - for Shortness of Breath Active  valacyclovir 500 mg oral tablet 1 tab(s) orally once a day Active  prochlorperazine 10 mg oral tablet 1 tab(s) orally every 6 hours, As Needed - for Nausea, Vomiting Active  prednisoLONE ophthalmic acetate 1% ophthalmic suspension 1 drop(s) to right eye 2 times a day Active  glimepiride 2 mg oral tablet 1 tab(s) orally 2 times a day Active  melatonin 1 mg oral tablet 1 tab(s) orally once a day (at bedtime) Active  Percocet 5/325 325 mg-5 mg oral tablet 1 tab(s) orally every 6 hours, As Needed Active   Review of Systems:  General  negative   Performance Status (ECOG) 0   Skin negative   Breast negative   Ophthalmologic negative   ENMT negative   Respiratory and Thorax negative   Cardiovascular negative   Gastrointestinal negative   Genitourinary negative   Musculoskeletal negative   Neurological negative   Psychiatric negative   Hematology/Lymphatics negative   Endocrine negative   Allergic/Immunologic negative   Nursing Notes:  Nursing Vital Signs and  Chemo Nursing Nursing Notes: *CC Vital Signs Flowsheet:   09-Feb-16 10:42  Temp Temperature 98  Pulse Pulse 92  Respirations Respirations 22  SBP SBP 122  DBP DBP 76  Pain Scale (0-10)  10  Current Weight (kg) (kg) 81.7   Physical Exam:  General/Skin/HEENT:  General normal   Skin normal   Eyes normal   ENMT normal   Head and Neck normal   Additional PE well-developed male in NAD. Lungs are clear to A&P cardiac examination shows regular rate and rhythm. Abdomen is benign. Range of motion of his right lower extremity is limited secondary to pain. No motor or sensory levels are appreciated. Deep palpation of the spine does not elicit pain.   Breasts/Resp/CV/GI/GU:  Respiratory and Thorax normal   Cardiovascular normal   Gastrointestinal normal   Genitourinary normal   MS/Neuro/Psych/Lymph:  Musculoskeletal normal   Neurological normal   Lymphatics normal   Other Results:  Radiology Results: XRay:    16-Dec-15 14:35, Hip Right Complete ([place])  Hip Right Complete ([place])   REASON FOR EXAM:    right hip pain  COMMENTS:       PROCEDURE: MDR - MDR HIP RIGHT COMPLETE  - Jan 25 2014  2:35PM     CLINICAL DATA:  Severe right hip pain x1 week    EXAM:  RIGHT HIP - COMPLETE 2+ VIEW    COMPARISON:  Metastatic bone survey dated 09/05/2013    FINDINGS:  No fracture or dislocation is seen.  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    Degenerative changes of the bilateral hips and lower lumbar spine.    Expansile lytic lesions in the bilateral posterior acetabuli and  rightparasymphyseal region, unchanged.     IMPRESSION:  No fracture or dislocation is seen.    Expansile lytic lesions in the bilateral posterior acetabuli and  right parasymphyseal region, unchanged.    Chronic right hip AVN.  Electronically Signed    By: Julian Hy M.D.    On: 01/25/2014 16:15         Verified By: Julian Hy, M.D.,  MRI:    02-Feb-16 15:41, MRI  Hip Right Without Contrast  MRI Hip Right Without Contrast   REASON FOR EXAM:    rt hip pain  COMMENTS:       PROCEDURE: MMR - MMR HIP RT WO CONTRAST  - Mar 14 2014  3:41PM     CLINICAL DATA:  Right hip pain. Abnormal x-rays. History of multiple  myeloma    EXAM:  MR OF THE RIGHT HIP WITHOUT CONTRAST    TECHNIQUE:  Multiplanar, multisequence MR imaging was performed. No intravenous  contrast was administered.  COMPARISON:  CT scan 05/17/2010    FINDINGS:  Bones: Progressive changes of multiple myeloma involving the right  hemipelvis. This involves the acetabulum and superior and inferior  pubic rami. There appears to be tumor outside the bone extending  through the medial cortex of the acetabulum and inferior pubic ramus  likely accounting for the patient's right hip pain. Edema  like  signal abnormality surrounding the inferior pubic ramus likely due  to pathologic fracture There are remote changes of myeloma involving  the left hemipelvis. No new or acute findings on that side.    Both hips are normally located.  Remote chronic right hip AVN noted.    No significant intrapelvic abnormalities. No inguinal mass or  adenopathy. The hip and thigh musculature appear normal.     IMPRESSION:    Progressive changes of multiple myeloma involving the right  hemipelvis with tumor outside the bone and probable pathologic  fracture of the inferior pubic ramus likely accounting for the  patient's hip pain.    Chronic changes of multiple myeloma involving the left hemipelvis.      Electronically Signed    By: Kalman Jewels M.D.    On: 03/14/2014 16:13     Verified By: Marlane Hatcher, M.D.,   Relevent Results:   Relevant Scans and Labs plain films and MRI scan reviewed   Assessment and Plan: Impression:   progressive multiple myeloma with increasing M spike and MRI and plain film evidence of increasing myelomatous involvement of the right hip and inferior pubic  rami. Plan:   this time I tried with a palliative course of radiation therapy to his right hip and including areas of the inferior pubic rami. Would plan on delivering 3000 cGy in 10 fractions. 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 I have requested and ordered CT simulation for later this week.  I would like to take this opportunity for allowing me to participate in the care of your patient..  Fax to Physician:  [facility] Fax: Halina Maidens, MD - [phone].  Electronic Signatures: Armstead Peaks (MD)  (Signed 09-Feb-16 14:16)  Authored: HPI, Diagnosis, Past Hx, PFSH, Allergies, Home Meds, ROS, Nursing Notes, Physical Exam, Other Results, Relevent Results, Encounter Assessment and Plan, Fax to Physician   Last Updated: 09-Feb-16 14:16 by Armstead Peaks (MD)

## 2014-06-14 ENCOUNTER — Ambulatory Visit: Payer: Medicare Other

## 2014-06-14 ENCOUNTER — Other Ambulatory Visit: Payer: Medicare Other

## 2014-06-14 ENCOUNTER — Ambulatory Visit: Payer: Medicare Other | Admitting: Oncology

## 2014-06-14 ENCOUNTER — Inpatient Hospital Stay (HOSPITAL_BASED_OUTPATIENT_CLINIC_OR_DEPARTMENT_OTHER): Payer: Commercial Managed Care - HMO | Admitting: Oncology

## 2014-06-14 ENCOUNTER — Inpatient Hospital Stay: Payer: Commercial Managed Care - HMO | Attending: Oncology

## 2014-06-14 ENCOUNTER — Inpatient Hospital Stay: Payer: Commercial Managed Care - HMO

## 2014-06-14 VITALS — BP 121/67 | HR 70 | Temp 96.3°F | Resp 18 | Wt 173.7 lb

## 2014-06-14 DIAGNOSIS — Z86718 Personal history of other venous thrombosis and embolism: Secondary | ICD-10-CM | POA: Insufficient documentation

## 2014-06-14 DIAGNOSIS — J449 Chronic obstructive pulmonary disease, unspecified: Secondary | ICD-10-CM

## 2014-06-14 DIAGNOSIS — I10 Essential (primary) hypertension: Secondary | ICD-10-CM

## 2014-06-14 DIAGNOSIS — E119 Type 2 diabetes mellitus without complications: Secondary | ICD-10-CM | POA: Diagnosis not present

## 2014-06-14 DIAGNOSIS — C9000 Multiple myeloma not having achieved remission: Secondary | ICD-10-CM | POA: Insufficient documentation

## 2014-06-14 DIAGNOSIS — Z923 Personal history of irradiation: Secondary | ICD-10-CM | POA: Diagnosis not present

## 2014-06-14 DIAGNOSIS — Z9481 Bone marrow transplant status: Secondary | ICD-10-CM | POA: Insufficient documentation

## 2014-06-14 DIAGNOSIS — C9002 Multiple myeloma in relapse: Secondary | ICD-10-CM

## 2014-06-14 DIAGNOSIS — Z79899 Other long term (current) drug therapy: Secondary | ICD-10-CM | POA: Insufficient documentation

## 2014-06-14 DIAGNOSIS — Z86711 Personal history of pulmonary embolism: Secondary | ICD-10-CM | POA: Insufficient documentation

## 2014-06-14 LAB — BASIC METABOLIC PANEL
ANION GAP: 7 (ref 5–15)
BUN: 25 mg/dL — ABNORMAL HIGH (ref 6–20)
CHLORIDE: 103 mmol/L (ref 101–111)
CO2: 25 mmol/L (ref 22–32)
Calcium: 8.1 mg/dL — ABNORMAL LOW (ref 8.9–10.3)
Creatinine, Ser: 0.9 mg/dL (ref 0.61–1.24)
GFR calc Af Amer: >60 mL/min (ref >60)
GFR calc non Af Amer: >60 mL/min (ref >60)
Glucose, Bld: 217 mg/dL — ABNORMAL HIGH (ref 65–99)
Potassium: 3.3 mmol/L — ABNORMAL LOW (ref 3.5–5.1)
Sodium: 135 mmol/L (ref 135–145)

## 2014-06-14 MED ORDER — ZOLEDRONIC ACID 4 MG/100ML IV SOLN
INTRAVENOUS | Status: AC
Start: 2014-06-14 — End: 2014-06-14
  Filled 2014-06-14: qty 100

## 2014-06-14 MED ORDER — SODIUM CHLORIDE 0.9 % IV SOLN
Freq: Once | INTRAVENOUS | Status: AC
  Administered 2014-06-14: 17:00:00 via INTRAVENOUS
  Filled 2014-06-14: qty 250

## 2014-06-14 MED ORDER — ZOLEDRONIC ACID 4 MG/5ML IV CONC
4.0000 mg | Freq: Once | INTRAVENOUS | Status: AC
  Administered 2014-06-14: 4 mg via INTRAVENOUS
  Filled 2014-06-14: qty 5

## 2014-06-14 NOTE — Progress Notes (Signed)
Patient has started taking the Revlimid at night and his fatigue has improved.

## 2014-06-16 NOTE — Progress Notes (Signed)
[facility]  [phone] [phone]  ID: Russell Keller OB: [birth year]  MR#: [id number]  NGE#:[id number]  Patient Care Team: Glean Hess, MD as PCP - General (Family Medicine)  CHIEF COMPLAINT:  Chief Complaint  Patient presents with  . Follow-up    multiple myeloma  . Chemotherapy    Zometa    INTERVAL HISTORY: Patient returns to clinic today for laboratory work and further evaluation. He continues to tolerate Revlimid well. He has now completed his XRT and his pain is significantly better controlled. He denies any abdominal pain.  He denies any chest pain or shortness of breath.  He denies any fevers, chills, or night sweats.  He denies any nausea, vomiting, constipation, or diarrhea.  He denies any easy bleeding or bruising.  Patient offers no specific complaints today.  REVIEW OF SYSTEMS:   Review of Systems  Constitutional: Negative.   Respiratory: Negative.   Cardiovascular: Negative.   Musculoskeletal: Negative.     As per HPI. Otherwise, a complete review of systems is negatve.  PAST MEDICAL HISTORY: Past Medical History  Diagnosis Date  . DVT (deep venous thrombosis)   . Diabetes mellitus without complication   . Hypertension   . COPD (chronic obstructive pulmonary disease)   . Plasmocytoma   . Multiple myeloma     PAST SURGICAL HISTORY: Past Surgical History  Procedure Laterality Date  . Bone marrow transplant    . Tonsillectomy    . Colon, bladder fistula repair      FAMILY HISTORY:  Unchanged. No report of malignancy or other chronic disease.     ADVANCED DIRECTIVES:    HEALTH MAINTENANCE: History  Substance Use Topics  . Smoking status: Not on file  . Smokeless tobacco: Not on file  . Alcohol Use: Not on file     Colonoscopy:  PAP:  Bone density:  Lipid panel:  Allergies  Allergen Reactions  . No Known Allergies     Current Outpatient Prescriptions  Medication Sig Dispense Refill    . baclofen (LIORESAL) 10 MG tablet Take 10 mg by mouth 3 (three) times daily as needed.  0  . dexamethasone (DECADRON) 4 MG tablet Take 20 mg by mouth once a week. Take 5,     4 mg tabs every Monday    . diazepam (VALIUM) 2 MG tablet Take 2 mg by mouth at bedtime.  3  . gabapentin (NEURONTIN) 300 MG capsule Take 300 mg by mouth 2 (two) times daily.    Marland Kitchen glimepiride (AMARYL) 2 MG tablet Take 2 mg by mouth 2 (two) times daily before lunch and supper.    Marland Kitchen ibuprofen (ADVIL,MOTRIN) 800 MG tablet Take 800 mg by mouth every 8 (eight) hours as needed.    Marland Kitchen lenalidomide (REVLIMID) 10 MG capsule Take 10 mg by mouth daily.    Marland Kitchen levalbuterol (XOPENEX HFA) 45 MCG/ACT inhaler Inhale 2 puffs into the lungs every 4 (four) hours as needed for wheezing.    . Melatonin 1 MG TABS Take 1 mg by mouth at bedtime.    Marland Kitchen omeprazole (PRILOSEC) 20 MG capsule Take 20 mg by mouth daily.    Marland Kitchen oxyCODONE (OXY IR/ROXICODONE) 5 MG immediate release tablet   0  . prednisoLONE acetate (PRED FORTE) 1 % ophthalmic suspension Place 1 drop into the right eye 2 (two) times daily.    . prochlorperazine (COMPAZINE) 10 MG tablet Take 10 mg by mouth every 6 (six) hours as needed.  1  .  valACYclovir (VALTREX) 500 MG tablet Take 500 mg by mouth daily.  5  . cyclobenzaprine (FLEXERIL) 10 MG tablet   1  . cyclobenzaprine (FLEXERIL) 5 MG tablet   0   No current facility-administered medications for this visit.    OBJECTIVE: Filed Vitals:   06/14/14 1632  BP: 121/67  Pulse: 70  Temp: 96.3 F (35.7 C)  Resp: 18     Body mass index is 25.67 kg/(m^2).    ECOG FS:0 - Asymptomatic  General: Well-developed, well-nourished, no acute distress. Eyes: anicteric sclera. Lungs: Clear to auscultation bilaterally. Heart: Regular rate and rhythm. No rubs, murmurs, or gallops. Abdomen: Soft, nontender, nondistended. No organomegaly noted, normoactive bowel sounds. Musculoskeletal: No edema, cyanosis, or clubbing. Neuro: Alert, answering all  questions appropriately. Cranial nerves grossly intact. Skin: No rashes or petechiae noted. Psych: Normal affect.    LAB RESULTS:  Lab Results  Component Value Date   NA 135 06/14/2014   K 3.3* 06/14/2014   CL 103 06/14/2014   CO2 25 06/14/2014   GLUCOSE 217* 06/14/2014   BUN 25* 06/14/2014   CREATININE 0.90 06/14/2014   CALCIUM 8.1* 06/14/2014   PROT 8.4* 03/08/2014   ALBUMIN 3.4 03/08/2014   AST 29 03/08/2014   ALT 25 03/08/2014   ALKPHOS 76 03/08/2014   GFRNONAA >60 06/14/2014   GFRAA >60 06/14/2014    Lab Results  Component Value Date   WBC 5.4 05/15/2014   NEUTROABS 2.5 05/15/2014   HGB 13.6 05/15/2014   HCT 39.9* 05/15/2014   MCV 97 05/15/2014   PLT 155 05/15/2014     STUDIES: No results found.  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  PLAN:    1.  Multiple myeloma:  Patient has now reinitiated low dose Revlimid 10 milligrams daily and is tolerating it well. His M spike has trended down and is now 1.1. Patient has also been instructed to reinitiate in 81 mg aspirin daily. Proceed with Zometa today. Return to clinic in 3 weeks for laboratory work and then in 4 weeks for continuation of Zometa. 2.  History of DVT/PE: He is no longer taking Coumadin. Patient has reinitiated 81 mg aspirin. 3   Pain: Continue current regimen. XRT has completed. 4.  Hypokalemia: Resolved, continue to monitor.   5.  Diarrhea: Patient has been instructed to use OTC Imodium as needed. 6.  Back pain: Chronic.  Continue ibuprofen as needed.  Patient expressed understanding and was in agreement with this plan. He also understands that He can call clinic at any time with any questions, concerns, or complaints.   No matching staging information was found for the patient.  Lloyd Huger, MD   06/16/2014 4:08 PM

## 2014-06-17 ENCOUNTER — Observation Stay
Admission: EM | Admit: 2014-06-17 | Discharge: 2014-06-18 | Disposition: A | Discharge status: Home / Self Care | Payer: Commercial Managed Care - HMO | Attending: Specialist | Admitting: Specialist

## 2014-06-17 ENCOUNTER — Encounter: Payer: Self-pay | Admitting: Emergency Medicine

## 2014-06-17 ENCOUNTER — Emergency Department: Payer: Commercial Managed Care - HMO

## 2014-06-17 DIAGNOSIS — I10 Essential (primary) hypertension: Secondary | ICD-10-CM

## 2014-06-17 DIAGNOSIS — J189 Pneumonia, unspecified organism: Principal | ICD-10-CM | POA: Insufficient documentation

## 2014-06-17 DIAGNOSIS — E119 Type 2 diabetes mellitus without complications: Secondary | ICD-10-CM | POA: Diagnosis not present

## 2014-06-17 DIAGNOSIS — Z9889 Other specified postprocedural states: Secondary | ICD-10-CM | POA: Diagnosis not present

## 2014-06-17 DIAGNOSIS — Z7952 Long term (current) use of systemic steroids: Secondary | ICD-10-CM | POA: Insufficient documentation

## 2014-06-17 DIAGNOSIS — J9801 Acute bronchospasm: Secondary | ICD-10-CM | POA: Diagnosis not present

## 2014-06-17 DIAGNOSIS — Z791 Long term (current) use of non-steroidal anti-inflammatories (NSAID): Secondary | ICD-10-CM | POA: Insufficient documentation

## 2014-06-17 DIAGNOSIS — Z86718 Personal history of other venous thrombosis and embolism: Secondary | ICD-10-CM | POA: Insufficient documentation

## 2014-06-17 DIAGNOSIS — J441 Chronic obstructive pulmonary disease with (acute) exacerbation: Secondary | ICD-10-CM | POA: Diagnosis not present

## 2014-06-17 DIAGNOSIS — Z9481 Bone marrow transplant status: Secondary | ICD-10-CM | POA: Insufficient documentation

## 2014-06-17 DIAGNOSIS — G893 Neoplasm related pain (acute) (chronic): Secondary | ICD-10-CM | POA: Insufficient documentation

## 2014-06-17 DIAGNOSIS — C9000 Multiple myeloma not having achieved remission: Secondary | ICD-10-CM | POA: Diagnosis not present

## 2014-06-17 DIAGNOSIS — Z79899 Other long term (current) drug therapy: Secondary | ICD-10-CM | POA: Diagnosis not present

## 2014-06-17 DIAGNOSIS — R0602 Shortness of breath: Secondary | ICD-10-CM | POA: Insufficient documentation

## 2014-06-17 DIAGNOSIS — F17200 Nicotine dependence, unspecified, uncomplicated: Secondary | ICD-10-CM | POA: Diagnosis not present

## 2014-06-17 LAB — COMPREHENSIVE METABOLIC PANEL
ALBUMIN: 3.6 g/dL (ref 3.5–5.0)
ALT: 40 U/L (ref 17–63)
ANION GAP: 9 (ref 5–15)
AST: 31 U/L (ref 15–41)
Alkaline Phosphatase: 81 U/L (ref 38–126)
BILIRUBIN TOTAL: 0.9 mg/dL (ref 0.3–1.2)
BUN: 22 mg/dL — ABNORMAL HIGH (ref 6–20)
CALCIUM: 7.9 mg/dL — AB (ref 8.9–10.3)
CO2: 26 mmol/L (ref 22–32)
Chloride: 104 mmol/L (ref 101–111)
Creatinine, Ser: 0.86 mg/dL (ref 0.61–1.24)
Glucose, Bld: 100 mg/dL — ABNORMAL HIGH (ref 65–99)
Potassium: 3.2 mmol/L — ABNORMAL LOW (ref 3.5–5.1)
SODIUM: 139 mmol/L (ref 135–145)
Total Protein: 6.9 g/dL (ref 6.5–8.1)

## 2014-06-17 LAB — CBC
HCT: 38.5 % — ABNORMAL LOW (ref 40.0–52.0)
Hemoglobin: 13.2 g/dL (ref 13.0–18.0)
MCH: 33.7 pg (ref 26.0–34.0)
MCHC: 34.3 g/dL (ref 32.0–36.0)
MCV: 98.3 fL (ref 80.0–100.0)
Platelets: 149 10*3/uL — ABNORMAL LOW (ref 150–440)
RBC: 3.91 MIL/uL — AB (ref 4.40–5.90)
RDW: 16.9 % — AB (ref 11.5–14.5)
WBC: 8 10*3/uL (ref 3.8–10.6)

## 2014-06-17 LAB — TROPONIN I

## 2014-06-17 LAB — GLUCOSE, CAPILLARY: Glucose-Capillary: 176 mg/dL — ABNORMAL HIGH (ref 70–99)

## 2014-06-17 MED ORDER — METHYLPREDNISOLONE SODIUM SUCC 125 MG IJ SOLR
60.0000 mg | Freq: Every day | INTRAMUSCULAR | Status: DC
  Administered 2014-06-18: 60 mg via INTRAVENOUS
  Filled 2014-06-17: qty 2

## 2014-06-17 MED ORDER — ONDANSETRON HCL 4 MG PO TABS: 4.0000 mg | ORAL_TABLET | Freq: Four times a day (QID) | ORAL | Status: DC | PRN

## 2014-06-17 MED ORDER — LEVOFLOXACIN IN D5W 500 MG/100ML IV SOLN
500.0000 mg | Freq: Once | INTRAVENOUS | Status: AC
  Administered 2014-06-17: 500 mg via INTRAVENOUS

## 2014-06-17 MED ORDER — LEVOFLOXACIN IN D5W 500 MG/100ML IV SOLN
500.0000 mg | INTRAVENOUS | Status: DC
  Filled 2014-06-17: qty 100

## 2014-06-17 MED ORDER — ACETAMINOPHEN 325 MG PO TABS: 650.0000 mg | ORAL_TABLET | Freq: Four times a day (QID) | ORAL | Status: DC | PRN

## 2014-06-17 MED ORDER — HEPARIN SODIUM (PORCINE) 5000 UNIT/ML IJ SOLN
5000.0000 [IU] | Freq: Three times a day (TID) | INTRAMUSCULAR | Status: DC
  Administered 2014-06-17: 5000 [IU] via SUBCUTANEOUS
  Filled 2014-06-17 (×2): qty 1

## 2014-06-17 MED ORDER — IPRATROPIUM-ALBUTEROL 0.5-2.5 (3) MG/3ML IN SOLN
RESPIRATORY_TRACT | Status: AC
  Filled 2014-06-17: qty 3

## 2014-06-17 MED ORDER — DIAZEPAM 2 MG PO TABS
2.0000 mg | ORAL_TABLET | Freq: Every day | ORAL | Status: DC
  Administered 2014-06-17: 2 mg via ORAL
  Filled 2014-06-17: qty 1

## 2014-06-17 MED ORDER — BACLOFEN 10 MG PO TABS: 10.0000 mg | ORAL_TABLET | Freq: Three times a day (TID) | ORAL | Status: DC | PRN

## 2014-06-17 MED ORDER — ONDANSETRON HCL 4 MG/2ML IJ SOLN: 4.0000 mg | Freq: Four times a day (QID) | INTRAMUSCULAR | Status: DC | PRN

## 2014-06-17 MED ORDER — MELATONIN 1 MG PO TABS
1.0000 mg | ORAL_TABLET | Freq: Every day | ORAL | Status: DC
Start: 2014-06-17 — End: 2014-06-17

## 2014-06-17 MED ORDER — IPRATROPIUM-ALBUTEROL 0.5-2.5 (3) MG/3ML IN SOLN
RESPIRATORY_TRACT | Status: AC
  Filled 2014-06-17: qty 6

## 2014-06-17 MED ORDER — VALACYCLOVIR HCL 500 MG PO TABS
500.0000 mg | ORAL_TABLET | Freq: Every day | ORAL | Status: DC
  Administered 2014-06-18: 10:00:00 500 mg via ORAL
  Filled 2014-06-17: qty 1

## 2014-06-17 MED ORDER — MELATONIN 3 MG PO TABS
1.5000 mg | ORAL_TABLET | Freq: Every day | ORAL | Status: DC
  Filled 2014-06-17 (×3): qty 0.5

## 2014-06-17 MED ORDER — IPRATROPIUM-ALBUTEROL 0.5-2.5 (3) MG/3ML IN SOLN
3.0000 mL | RESPIRATORY_TRACT | Status: DC
  Administered 2014-06-18 (×5): 3 mL via RESPIRATORY_TRACT
  Filled 2014-06-17 (×5): qty 3

## 2014-06-17 MED ORDER — MORPHINE SULFATE 2 MG/ML IJ SOLN: 2.0000 mg | INTRAMUSCULAR | Status: DC | PRN

## 2014-06-17 MED ORDER — ACETAMINOPHEN 650 MG RE SUPP: 650.0000 mg | Freq: Four times a day (QID) | RECTAL | Status: DC | PRN

## 2014-06-17 MED ORDER — PANTOPRAZOLE SODIUM 40 MG PO TBEC
40.0000 mg | DELAYED_RELEASE_TABLET | Freq: Every day | ORAL | Status: DC
  Administered 2014-06-18: 40 mg via ORAL
  Filled 2014-06-17: qty 1

## 2014-06-17 MED ORDER — GABAPENTIN 300 MG PO CAPS
300.0000 mg | ORAL_CAPSULE | Freq: Two times a day (BID) | ORAL | Status: DC
  Administered 2014-06-17 – 2014-06-18 (×2): 300 mg via ORAL
  Filled 2014-06-17 (×2): qty 1

## 2014-06-17 MED ORDER — IPRATROPIUM-ALBUTEROL 0.5-2.5 (3) MG/3ML IN SOLN
3.0000 mL | Freq: Once | RESPIRATORY_TRACT | Status: AC
  Administered 2014-06-17: 3 mL via RESPIRATORY_TRACT

## 2014-06-17 MED ORDER — LENALIDOMIDE 10 MG PO CAPS: 10.0000 mg | ORAL_CAPSULE | Freq: Every day | ORAL | Status: DC

## 2014-06-17 MED ORDER — INSULIN ASPART 100 UNIT/ML ~~LOC~~ SOLN
0.0000 [IU] | Freq: Every day | SUBCUTANEOUS | Status: DC
Start: 2014-06-17 — End: 2014-06-18
  Filled 2014-06-17: qty 1

## 2014-06-17 MED ORDER — SODIUM CHLORIDE 0.9 % IV SOLN
INTRAVENOUS | Status: DC
  Administered 2014-06-17 – 2014-06-18 (×2): via INTRAVENOUS

## 2014-06-17 MED ORDER — PREDNISOLONE ACETATE 1 % OP SUSP
1.0000 [drp] | Freq: Two times a day (BID) | OPHTHALMIC | Status: DC
  Administered 2014-06-18: 1 [drp] via OPHTHALMIC
  Filled 2014-06-17 (×2): qty 1

## 2014-06-17 MED ORDER — LEVOFLOXACIN IN D5W 500 MG/100ML IV SOLN
INTRAVENOUS | Status: AC
  Filled 2014-06-17: qty 100

## 2014-06-17 MED ORDER — INSULIN ASPART 100 UNIT/ML ~~LOC~~ SOLN
0.0000 [IU] | Freq: Three times a day (TID) | SUBCUTANEOUS | Status: DC
  Administered 2014-06-18: 08:00:00 1 [IU] via SUBCUTANEOUS
  Administered 2014-06-18: 2 [IU] via SUBCUTANEOUS
  Filled 2014-06-17: qty 2

## 2014-06-17 MED ORDER — SODIUM CHLORIDE 0.9 % IV SOLN
Freq: Once | INTRAVENOUS | Status: AC
  Administered 2014-06-17: 19:00:00 via INTRAVENOUS

## 2014-06-17 NOTE — ED Provider Notes (Signed)
[facility] Emergency Department Provider Note  ____________________________________________  Time seen: 6:45 PM  I have reviewed the triage vital signs and the nursing notes.   HISTORY  Chief Complaint Shortness of Breath      HPI Russell Keller is a 69 y.o. male presents with progressive dyspnea and nonproductive cough 3 days. Patient denies any fever no chest pain. Of note patient has a history of multiple myeloma and currently undergoing chemotherapy. Patient also has a history of COPD.     Past Medical History  Diagnosis Date  . DVT (deep venous thrombosis)   . Diabetes mellitus without complication   . Hypertension   . COPD (chronic obstructive pulmonary disease)   . Plasmocytoma   . Multiple myeloma     Patient Active Problem List   Diagnosis Date Noted  . DVT (deep venous thrombosis)   . Plasmocytoma   . Multiple myeloma   . Multiple myeloma in relapse 06/04/2014    Past Surgical History  Procedure Laterality Date  . Bone marrow transplant    . Tonsillectomy    . Colon, bladder fistula repair      Current Outpatient Rx  Name  Route  Sig  Dispense  Refill  . baclofen (LIORESAL) 10 MG tablet   Oral   Take 10 mg by mouth 3 (three) times daily as needed.      0   . cyclobenzaprine (FLEXERIL) 10 MG tablet            1   . cyclobenzaprine (FLEXERIL) 5 MG tablet            0   . dexamethasone (DECADRON) 4 MG tablet   Oral   Take 20 mg by mouth once a week. Take 5,     4 mg tabs every Monday         . diazepam (VALIUM) 2 MG tablet   Oral   Take 2 mg by mouth at bedtime.      3   . gabapentin (NEURONTIN) 300 MG capsule   Oral   Take 300 mg by mouth 2 (two) times daily.         Marland Kitchen glimepiride (AMARYL) 2 MG tablet   Oral   Take 2 mg by mouth 2 (two) times daily before lunch and supper.         Marland Kitchen ibuprofen (ADVIL,MOTRIN) 800 MG tablet   Oral   Take 800 mg by mouth every 8 (eight) hours as  needed.         Marland Kitchen lenalidomide (REVLIMID) 10 MG capsule   Oral   Take 10 mg by mouth daily.         Marland Kitchen levalbuterol (XOPENEX HFA) 45 MCG/ACT inhaler   Inhalation   Inhale 2 puffs into the lungs every 4 (four) hours as needed for wheezing.         . Melatonin 1 MG TABS   Oral   Take 1 mg by mouth at bedtime.         Marland Kitchen omeprazole (PRILOSEC) 20 MG capsule   Oral   Take 20 mg by mouth daily.         Marland Kitchen oxyCODONE (OXY IR/ROXICODONE) 5 MG immediate release tablet            0   . prednisoLONE acetate (PRED FORTE) 1 % ophthalmic suspension   Right Eye   Place 1 drop into the right eye 2 (two) times daily.         Marland Kitchen  prochlorperazine (COMPAZINE) 10 MG tablet   Oral   Take 10 mg by mouth every 6 (six) hours as needed.      1   . valACYclovir (VALTREX) 500 MG tablet   Oral   Take 500 mg by mouth daily.      5     Allergies No known allergies  History reviewed. No pertinent family history.  Social History History  Substance Use Topics  . Smoking status: Current Every Day Smoker  . Smokeless tobacco: Never Used  . Alcohol Use: No    Review of Systems  Constitutional: Negative for fever. Eyes: Negative for visual changes. ENT: Negative for sore throat. Cardiovascular: Negative for chest pain. Respiratory: Negative for shortness of breath. Gastrointestinal: Negative for abdominal pain, vomiting and diarrhea. Genitourinary: Negative for dysuria. Musculoskeletal: Negative for back pain. Skin: Negative for rash. Neurological: Negative for headaches, focal weakness or numbness. Psychiatric: Anxious  10-point ROS otherwise negative.  ____________________________________________   PHYSICAL EXAM:  VITAL SIGNS: ED Triage Vitals  Enc Vitals Group     BP 06/17/14 1847 110/77 mmHg     Pulse Rate 06/17/14 1847 93     Resp 06/17/14 1847 20     Temp 06/17/14 1847 98.2 F (36.8 C)     Temp Source 06/17/14 1847 Oral     SpO2 06/17/14 1847 92 %      Weight --      Height --      Head Cir --      Peak Flow --      Pain Score --      Pain Loc --      Pain Edu? --      Excl. in [place]? --    ____________________________________________    LABS (pertinent positives/negatives)  Labs Reviewed  CBC - Abnormal; Notable for the following:    RBC 3.91 (*)    HCT 38.5 (*)    RDW 16.9 (*)    Platelets 149 (*)    All other components within normal limits  COMPREHENSIVE METABOLIC PANEL - Abnormal; Notable for the following:    Potassium 3.2 (*)    Glucose, Bld 100 (*)    BUN 22 (*)    Calcium 7.9 (*)    All other components within normal limits  TROPONIN I     ____________________________________________   EKG   Date: 06/17/2014  Rate: 90  Rhythm: normal sinus rhythm  QRS Axis: normal  Intervals: normal  ST/T Wave abnormalities: normal  Conduction Disutrbances: none  Narrative Interpretation: unremarkable      ____________________________________________    RADIOLOGY  RIGHT MIDDLE LOBE PNEUMONIA  ____________________________________________     Critical Care performed: DuoNeb 3 given for respiratory distress and hypoxia. Critical care time 30 minutes  ____________________________________________   INITIAL IMPRESSION / ASSESSMENT AND PLAN / ED COURSE  Pertinent labs & imaging results that were available during my care of the patient were reviewed by me and considered in my medical decision making (see chart for details).  Given history and physical exam, severe rest or distress patient received DuoNeb 3.  ____________________________________________   FINAL CLINICAL IMPRESSION(S) / ED DIAGNOSES  Final diagnoses:  Community acquired pneumonia      Gregor Hams, MD 06/17/14 2101

## 2014-06-17 NOTE — ED Notes (Signed)
Patient to ED with c/o increasing shortness of breath for a couple of days and feeling like he is loosing his breath when he lays down. Patient currently receiving chemotherapy for multiple myeloma.  

## 2014-06-17 NOTE — H&P (Signed)
[facility] [place] NAME: Russell Keller    MR#:  [id number]  DATE OF BIRTH:  29-Mar-1945   DATE OF ADMISSION:  06/17/2014  PRIMARY CARE PHYSICIAN: Halina Maidens, MD   REQUESTING/REFERRING PHYSICIAN: Owens Shark  CHIEF COMPLAINT:   Chief Complaint  Patient presents with  . Shortness of Breath    HISTORY OF PRESENT ILLNESS:  Russell Keller  is a 69 y.o. male with a known history of COPD non-oxygen dependent, everyday tobacco usage presenting with shortness of breath. Describes three-day duration of cough productive of white-yellow sputum with associated nasal congestion and discharge. Now presents with a 1 day duration of shortness of breath both at rest and with exertion. Denies any fevers or chills denies chest pain or further symptomatology given his respiratory status decided to present to the hospital further workup and evaluation  PAST MEDICAL HISTORY:   Past Medical History  Diagnosis Date  . DVT (deep venous thrombosis)   . Diabetes mellitus without complication   . Hypertension   . COPD (chronic obstructive pulmonary disease)   . Plasmocytoma   . Multiple myeloma     PAST SURGICAL HISTORY:   Past Surgical History  Procedure Laterality Date  . Bone marrow transplant    . Tonsillectomy    . Colon, bladder fistula repair      SOCIAL HISTORY:   History  Substance Use Topics  . Smoking status: Current Every Day Smoker  . Smokeless tobacco: Never Used  . Alcohol Use: No    FAMILY HISTORY:  History reviewed. No pertinent family history.  DRUG ALLERGIES:   Allergies  Allergen Reactions  . No Known Allergies     REVIEW OF SYSTEMS:  REVIEW OF SYSTEMS:  CONSTITUTIONAL: Denies fevers, chills, fatigue, weakness.  EYES: Denies blurred vision, double vision, or eye pain.  EARS, NOSE, THROAT: Denies tinnitus, ear pain, hearing loss. Positive nasal congestion RESPIRATORY: Positive for cough, shortness of  breath, wheezing as described above CARDIOVASCULAR: Denies chest pain, palpitations, edema.  GASTROINTESTINAL: Denies nausea, vomiting, diarrhea, abdominal pain.  GENITOURINARY: Denies dysuria, hematuria.  ENDOCRINE: Denies nocturia or thyroid problems. HEMATOLOGIC AND LYMPHATIC: Denies easy bruising or bleeding.  SKIN: Denies rash or lesions.  MUSCULOSKELETAL: Denies pain in neck, back, shoulder, knees, hips, or further arthritic symptoms.  NEUROLOGIC: Denies paralysis, paresthesias.  PSYCHIATRIC: Denies anxiety or depressive symptoms. Otherwise full review of systems performed by me is negative.   MEDICATIONS AT HOME:   Prior to Admission medications   Medication Sig Start Date End Date Taking? Authorizing Provider  baclofen (LIORESAL) 10 MG tablet Take 10 mg by mouth 3 (three) times daily as needed for muscle spasms.  04/12/14  Yes Historical Provider, MD  dexamethasone (DECADRON) 4 MG tablet Take 20 mg by mouth every 7 (seven) days. Take 5 tablets.  4 mg tabs every Monday   Yes Historical Provider, MD  diazepam (VALIUM) 2 MG tablet Take 2 mg by mouth at bedtime. 04/11/14  Yes Historical Provider, MD  gabapentin (NEURONTIN) 300 MG capsule Take 300 mg by mouth 2 (two) times daily.   Yes Historical Provider, MD  glimepiride (AMARYL) 2 MG tablet Take 2 mg by mouth 2 (two) times daily before lunch and supper.   Yes Historical Provider, MD  ibuprofen (ADVIL,MOTRIN) 800 MG tablet Take 800 mg by mouth every 8 (eight) hours as needed for moderate pain.    Yes Historical Provider, MD  lenalidomide (REVLIMID) 10 MG capsule Take 10 mg  by mouth daily.   Yes Historical Provider, MD  levalbuterol [facility] HFA) 45 MCG/ACT inhaler Inhale 2 puffs into the lungs every 4 (four) hours as needed for wheezing.   Yes Historical Provider, MD  Melatonin 1 MG TABS Take 1 mg by mouth at bedtime.   Yes Historical Provider, MD  morphine (MSIR) 15 MG tablet Take 15 mg by mouth 3 (three) times daily.   Yes Historical  Provider, MD  omeprazole (PRILOSEC) 20 MG capsule Take 20 mg by mouth daily as needed. For heartburn   Yes Historical Provider, MD  prednisoLONE acetate (PRED FORTE) 1 % ophthalmic suspension Place 1 drop into the right eye 2 (two) times daily.   Yes Historical Provider, MD  prochlorperazine (COMPAZINE) 10 MG tablet Take 10 mg by mouth every 6 (six) hours as needed for nausea or vomiting.  03/24/14  Yes Historical Provider, MD  valACYclovir (VALTREX) 500 MG tablet Take 500 mg by mouth daily. 05/24/14  Yes Historical Provider, MD      VITAL SIGNS:  Blood pressure 150/78, pulse 86, temperature 98.2 F (36.8 C), temperature source Oral, resp. rate 16, SpO2 95 %.  PHYSICAL EXAMINATION:  VITAL SIGNS: Filed Vitals:   06/17/14 2052  BP: 150/78  Pulse: 86  Temp:   Resp: 16   GENERAL:69 y.o.male currently in no acute distress.  HEAD: Normocephalic, atraumatic.  EYES: Pupils equal, round, reactive to light. Extraocular muscles intact. No scleral icterus.  MOUTH: Dry mucosal membrane. Dentition intact. No abscess noted.  EAR, NOSE, THROAT: Clear without exudates. No external lesions.  NECK: Supple. No thyromegaly. No nodules. No JVD.  PULMONARY: Diffuse expiratory wheeze with right basilar coarse rhonchi. No use of accessory muscles, Good respiratory effort. good air entry bilaterally CHEST: Nontender to palpation.  CARDIOVASCULAR: S1 and S2. Regular rate and rhythm. No murmurs, rubs, or gallops. No edema. Pedal pulses 2+ bilaterally.  GASTROINTESTINAL: Soft, nontender, nondistended. No masses. Positive bowel sounds. No hepatosplenomegaly.  MUSCULOSKELETAL: No swelling, clubbing, or edema. Range of motion full in all extremities.  NEUROLOGIC: Cranial nerves II through XII are intact. No gross focal neurological deficits. Sensation intact. Reflexes intact.  SKIN: No ulceration, lesions, rashes, or cyanosis. Skin warm and dry. Turgor intact.  PSYCHIATRIC: Mood, affect within normal limits. The  patient is awake, alert and oriented x 3. Insight, judgment intact.    LABORATORY PANEL:   CBC  Recent Labs Lab 06/17/14 1900  WBC 8.0  HGB 13.2  HCT 38.5*  PLT 149*   ------------------------------------------------------------------------------------------------------------------  Chemistries   Recent Labs Lab 06/17/14 1900  NA 139  K 3.2*  CL 104  CO2 26  GLUCOSE 100*  BUN 22*  CREATININE 0.86  CALCIUM 7.9*  AST 31  ALT 40  ALKPHOS 81  BILITOT 0.9   ------------------------------------------------------------------------------------------------------------------  Cardiac Enzymes  Recent Labs Lab 06/17/14 1900  TROPONINI <0.03   ------------------------------------------------------------------------------------------------------------------  RADIOLOGY:  Dg Chest Portable 1 View  06/17/2014   CLINICAL DATA:  Shortness of breath for 2 days  EXAM: PORTABLE CHEST - 1 VIEW  COMPARISON:  09/05/2013  FINDINGS: Cardiac shadow is within normal limits. The lungs are hyperinflated. Some patchy infiltrate is noted in the right lung base new from the prior exam no other focal infiltrate is seen. No bony abnormality is noted.  IMPRESSION: New right basilar infiltrate.   Electronically Signed   By: Inez Catalina M.D.   On: 06/17/2014 19:38    EKG:  No orders found for this or any previous visit.  IMPRESSION AND PLAN:   69 year old Caucasian gentleman who she has COPD non-option requiring his admission with shortness of breath.  1. Community-acquired pneumonia, right lower lobe: provide supplemental O2 to keep oxygen saturations greater than 92%, DuoNeb treatments every 4 hours, and about a coverage with Levaquin, add Solu-Medrol 60 mg IV daily given wheeze. 2. Type 2 diabetes uncomplicated: Hold by mouth agents and insulin sliding scale with every 6 hours Accu-Cheks. 3. Multiple myeloma: Continue Revlimid. 4. Venous thromboembolism prophylactic: Heparin  subcutaneous  All the records are reviewed and case discussed with ED provider. Management plans discussed with the patient, family and they are in agreement.  CODE STATUS: Full code  TOTAL TIME TAKING CARE OF THIS PATIENT: 35 minutes.    Hower,  Karenann Cai.D on 06/17/2014 at 9:32 PM  Between 7am to 6pm - Pager - [phone]  After 6pm: House Pager: [phone]  [facility]  Office  [phone]  CC: Primary care physician; Halina Maidens, MD

## 2014-06-18 LAB — BASIC METABOLIC PANEL
Anion gap: 6 (ref 5–15)
BUN: 15 mg/dL (ref 6–20)
CHLORIDE: 105 mmol/L (ref 101–111)
CO2: 29 mmol/L (ref 22–32)
CREATININE: 0.82 mg/dL (ref 0.61–1.24)
Calcium: 7.3 mg/dL — ABNORMAL LOW (ref 8.9–10.3)
GFR calc Af Amer: >60 mL/min (ref >60)
GFR calc non Af Amer: >60 mL/min (ref >60)
GLUCOSE: 175 mg/dL — AB (ref 65–99)
Potassium: 3 mmol/L — ABNORMAL LOW (ref 3.5–5.1)
Sodium: 140 mmol/L (ref 135–145)

## 2014-06-18 LAB — GLUCOSE, CAPILLARY
GLUCOSE-CAPILLARY: 129 mg/dL — AB (ref 70–99)
GLUCOSE-CAPILLARY: 198 mg/dL — AB (ref 70–99)

## 2014-06-18 LAB — CBC
HCT: 34.4 % — ABNORMAL LOW (ref 40.0–52.0)
HEMOGLOBIN: 11.7 g/dL — AB (ref 13.0–18.0)
MCH: 34.1 pg — ABNORMAL HIGH (ref 26.0–34.0)
MCHC: 34.1 g/dL (ref 32.0–36.0)
MCV: 99.9 fL (ref 80.0–100.0)
PLATELETS: 126 10*3/uL — AB (ref 150–440)
RBC: 3.45 MIL/uL — AB (ref 4.40–5.90)
RDW: 16.7 % — ABNORMAL HIGH (ref 11.5–14.5)
WBC: 5.5 10*3/uL (ref 3.8–10.6)

## 2014-06-18 MED ORDER — NYSTATIN 500000 UNITS PO TABS
500000.0000 [IU] | ORAL_TABLET | Freq: Three times a day (TID) | ORAL | Status: DC
  Filled 2014-06-18 (×3): qty 1

## 2014-06-18 MED ORDER — NYSTATIN 100000 UNIT/ML MT SUSP
5.0000 mL | Freq: Three times a day (TID) | OROMUCOSAL | Status: DC
  Filled 2014-06-18: qty 5

## 2014-06-18 MED ORDER — LENALIDOMIDE 10 MG PO CAPS: 10.0000 mg | ORAL_CAPSULE | Freq: Every day | ORAL | Status: DC

## 2014-06-18 MED ORDER — MORPHINE SULFATE 15 MG PO TABS
15.0000 mg | ORAL_TABLET | Freq: Three times a day (TID) | ORAL | Status: DC
  Administered 2014-06-18: 13:00:00 15 mg via ORAL
  Filled 2014-06-18: qty 1

## 2014-06-18 MED ORDER — PREDNISONE 10 MG PO TABS: 10.0000 mg | ORAL_TABLET | Freq: Every day | ORAL | Status: DC

## 2014-06-18 MED ORDER — LEVOFLOXACIN 500 MG PO TABS: 500.0000 mg | ORAL_TABLET | Freq: Every day | ORAL | Status: DC

## 2014-06-18 NOTE — Discharge Summary (Signed)
[facility] [place] NAME: Russell Keller    MR#:  [id number]  DATE OF BIRTH:  [birth year]  DATE OF ADMISSION:  06/17/2014 ADMITTING PHYSICIAN: Lytle Butte, MD  DATE OF DISCHARGE: 06/18/14  PRIMARY CARE PHYSICIAN: Halina Maidens, MD    ADMISSION DIAGNOSIS:  Community acquired pneumonia [J18.9]  DISCHARGE DIAGNOSIS:  Principal Problem:   Community acquired pneumonia Active Problems:   Uncomplicated type 2 diabetes mellitus   Essential hypertension   SECONDARY DIAGNOSIS:   Past Medical History  Diagnosis Date  . DVT (deep venous thrombosis)   . Diabetes mellitus without complication   . Hypertension   . COPD (chronic obstructive pulmonary disease)   . Plasmocytoma   . Multiple myeloma     HOSPITAL COURSE:   69 year old male with history of multiple myeloma COPD diabetes history of DVT who presented to the hospital with shortness of breath and cough and noted to have a community acquired Pneumonia.   #1 community acquired pneumonia - patient was admitted to the hospital started on IV antibiotics with Levaquin and has clinically improved. Patient is clinically afebrile and hemodynamically stable.He is therefore being discharged in her 7 more day course of oral Levaquin.  #2 COPD exacerbation - patient was treated with IV steroids and also given empiric IV antibiotics for any underlying pneumonia.His bronchospasm and wheezing has clinically improved. He was antedated on room air did not desaturate below 94%.He is therefore not being discharge on the oral prednisone taper along with maintenance of his albuterol inhaler.  #3 multiple myeloma patient will continue follow-up with Dr. Grayland Ormond he will continue his Revlimid.  #4 diabetes type 2 without complication- patient will continue his Glimeperide.   #5chronic pain- this is due to patient's malignancy patient will continue his as needed morphine.  DISCHARGE CONDITIONS:    Stable  CONSULTS OBTAINED:     DRUG ALLERGIES:   Allergies  Allergen Reactions  . No Known Allergies     DISCHARGE MEDICATIONS:   Current Discharge Medication List    START taking these medications   Details  levofloxacin (LEVAQUIN) 500 MG tablet Take 1 tablet (500 mg total) by mouth daily. Qty: 7 tablet, Refills: 1    predniSONE (DELTASONE) 10 MG tablet Take 1 tablet (10 mg total) by mouth daily. Label  & dispense according to the schedule below. 5 Pills PO for 1 day then, 4 Pills PO for 1 day, 3 Pills PO for 1 day, 2 Pills PO for 1 day1, 1 Pill PO for 1 days then STOP. Qty: 15 tablet, Refills: 1      CONTINUE these medications which have NOT CHANGED   Details  baclofen (LIORESAL) 10 MG tablet Take 10 mg by mouth 3 (three) times daily as needed for muscle spasms.  Refills: 0    dexamethasone (DECADRON) 4 MG tablet Take 20 mg by mouth every 7 (seven) days. Take 5 tablets.  4 mg tabs every Monday    diazepam (VALIUM) 2 MG tablet Take 2 mg by mouth at bedtime. Refills: 3    gabapentin (NEURONTIN) 300 MG capsule Take 300 mg by mouth 2 (two) times daily.    glimepiride (AMARYL) 2 MG tablet Take 2 mg by mouth 2 (two) times daily before lunch and supper.    ibuprofen (ADVIL,MOTRIN) 800 MG tablet Take 800 mg by mouth every 8 (eight) hours as needed for moderate pain.     lenalidomide (REVLIMID) 10 MG capsule Take 10 mg by  mouth daily.    levalbuterol (XOPENEX HFA) 45 MCG/ACT inhaler Inhale 2 puffs into the lungs every 4 (four) hours as needed for wheezing.    Melatonin 1 MG TABS Take 1 mg by mouth at bedtime.    morphine (MSIR) 15 MG tablet Take 15 mg by mouth 3 (three) times daily.    omeprazole (PRILOSEC) 20 MG capsule Take 20 mg by mouth daily as needed. For heartburn    prednisoLONE acetate (PRED FORTE) 1 % ophthalmic suspension Place 1 drop into the right eye 2 (two) times daily.    prochlorperazine (COMPAZINE) 10 MG tablet Take 10 mg by mouth every 6 (six)  hours as needed for nausea or vomiting.  Refills: 1    valACYclovir (VALTREX) 500 MG tablet Take 500 mg by mouth daily. Refills: 5         DISCHARGE INSTRUCTIONS:   DIET:  Diabetic diet  DISCHARGE CONDITION:  Good  ACTIVITY:  Activity as tolerated  OXYGEN:  Home Oxygen: No.   Oxygen Delivery: room air  DISCHARGE LOCATION:  home   If you experience worsening of your admission symptoms, develop shortness of breath, life threatening emergency, suicidal or homicidal thoughts you must seek medical attention immediately by calling 911 or calling your MD immediately  if symptoms less severe.  You Must read complete instructions/literature along with all the possible adverse reactions/side effects for all the Medicines you take and that have been prescribed to you. Take any new Medicines after you have completely understood and accpet all the possible adverse reactions/side effects.   Please note  You were cared for by a hospitalist during your hospital stay. If you have any questions about your discharge medications or the care you received while you were in the hospital after you are discharged, you can call the unit and asked to speak with the hospitalist on call if the hospitalist that took care of you is not available. Once you are discharged, your primary care physician will handle any further medical issues. Please note that NO REFILLS for any discharge medications will be authorized once you are discharged, as it is imperative that you return to your primary care physician (or establish a relationship with a primary care physician if you do not have one) for your aftercare needs so that they can reassess your need for medications and monitor your lab values.     Today   CHIEF COMPLAINT:   Chief Complaint  Patient presents with  . Shortness of Breath    HISTORY OF PRESENT ILLNESS:  Russell Keller  is a 69 y.o. male with a known history of multiple myeloma, DM, HTN,  COPD, who came to the hospital due to shortness of breath, cough and noted to have CAP w/ mild COPD exacerbation.    VITAL SIGNS:  Blood pressure 113/62, pulse 82, temperature 98.4 F (36.9 C), temperature source Oral, resp. rate 20, height 5' 8" (1.727 m), weight 73.539 kg (162 lb 2 oz), SpO2 91 %.  I/O:   Intake/Output Summary (Last 24 hours) at 06/18/14 1537 Last data filed at 06/18/14 1531  Gross per 24 hour  Intake   1280 ml  Output   2220 ml  Net   -940 ml    PHYSICAL EXAMINATION:  GENERAL:  69 y.o.-year-old patient lying in the bed with no acute distress.  EYES: Pupils equal, round, reactive to light and accommodation. No scleral icterus. Extraocular muscles intact.  HEENT: Head atraumatic, normocephalic. Oropharynx and nasopharynx clear.  NECK:  Supple, no jugular venous distention. No thyroid enlargement, no tenderness.  LUNGS: Good A/E b/l.  Minimal end-exp. Wheezing.  No rales, rhonchi (-) use of accessory muscles. CARDIOVASCULAR: S1, S2 normal. No murmurs, rubs, or gallops.  ABDOMEN: Soft, non-tender, non-distended. Bowel sounds present. No organomegaly or mass.  EXTREMITIES: No pedal edema, cyanosis, or clubbing.  NEUROLOGIC: Cranial nerves II through XII are intact. No focal motor or sensory defecits b/l.  PSYCHIATRIC: The patient is alert and oriented x 3.  SKIN: No obvious rash, lesion, or ulcer.   DATA REVIEW:   CBC  Recent Labs Lab 06/18/14 0537  WBC 5.5  HGB 11.7*  HCT 34.4*  PLT 126*    Chemistries   Recent Labs Lab 06/17/14 1900 06/18/14 0537  NA 139 140  K 3.2* 3.0*  CL 104 105  CO2 26 29  GLUCOSE 100* 175*  BUN 22* 15  CREATININE 0.86 0.82  CALCIUM 7.9* 7.3*  AST 31  --   ALT 40  --   ALKPHOS 81  --   BILITOT 0.9  --     Cardiac Enzymes  Recent Labs Lab 06/17/14 1900  TROPONINI <0.03    Microbiology Results  No results found for this or any previous visit.  RADIOLOGY:  Dg Chest Portable 1 View  06/17/2014   CLINICAL  DATA:  Shortness of breath for 2 days  EXAM: PORTABLE CHEST - 1 VIEW  COMPARISON:  09/05/2013  FINDINGS: Cardiac shadow is within normal limits. The lungs are hyperinflated. Some patchy infiltrate is noted in the right lung base new from the prior exam no other focal infiltrate is seen. No bony abnormality is noted.  IMPRESSION: New right basilar infiltrate.   Electronically Signed   By: Inez Catalina M.D.   On: 06/17/2014 19:38      Management plans discussed with the patient, family and they are in agreement.  CODE STATUS:     Code Status Orders        Start     Ordered   06/17/14 2111  Full code   Continuous     06/17/14 2111      TOTAL TIME TAKING CARE OF THIS PATIENT: 35 minutes.    Henreitta Leber M.D on 06/18/2014 at 3:37 PM  Between 7am to 6pm - Pager - [phone]  After 6pm go to [email or web address] - password [email or web address] [facility]  Office  [phone]  CC: Primary care physician; Halina Maidens, MD

## 2014-06-18 NOTE — Plan of Care (Signed)
Problem: Discharge Progression Outcomes Goal: Discharge plan in place and appropriate Outcome: Progressing Individualization: Patient is a Moderate Fall Risk From home with wife Hx COPD, DM, DVT, COPD, multiple myeloma, continue home medications Patient a few years ago was in a coma. 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 Patient states urgency and incontinence appeared within the last week since he began morphine Frequently asks for sugary food and drinks--patient educated. Goal: Other Discharge Outcomes/Goals Outcome: Progressing Patient is alert and oriented, no c/o pain. Independent in room. At times with movement patient feels SOB, but remains with normal oxygen saturations. Lungs have inspiratory and expiratory wheezes. No coverage for insuline in evening. IV fluids running, water encouraged to drink instead of juice.

## 2014-06-18 NOTE — Progress Notes (Signed)
Pt's ride present for discharge; pt discharged via wheelchair by nursing to the visitor's entrance 

## 2014-06-18 NOTE — Progress Notes (Signed)
MD order received to discharge pt home today; verbally reviewed [facility] discharge instructions verbally with pt including medications/gave Rxs to pt; diet; activity level and follow up appointment/pt to call primary care physician on 06/19/14 to schedule follow up appointment for 1 week; no further questions voiced at this time; pt's discharge pending arrival of his ride home

## 2014-06-18 NOTE — Discharge Instructions (Signed)
°  DIET:  Diabetic diet  DISCHARGE CONDITION:  Good  ACTIVITY:  Activity as tolerated  OXYGEN:  Home Oxygen: No   Oxygen Delivery: room air  DISCHARGE LOCATION:  home   If you experience worsening of your admission symptoms, develop shortness of breath, life threatening emergency, suicidal or homicidal thoughts you must seek medical attention immediately by calling 911 or calling your MD immediately  if symptoms less severe.  You Must read complete instructions/literature along with all the possible adverse reactions/side effects for all the Medicines you take and that have been prescribed to you. Take any new Medicines after you have completely understood and accpet all the possible adverse reactions/side effects.   Please note  You were cared for by a hospitalist during your hospital stay. If you have any questions about your discharge medications or the care you received while you were in the hospital after you are discharged, you can call the unit and asked to speak with the hospitalist on call if the hospitalist that took care of you is not available. Once you are discharged, your primary care physician will handle any further medical issues. Please note that NO REFILLS for any discharge medications will be authorized once you are discharged, as it is imperative that you return to your primary care physician (or establish a relationship with a primary care physician if you do not have one) for your aftercare needs so that they can reassess your need for medications and monitor your lab values.   Pneumonia, Adult Pneumonia is an infection of the lungs. It may be caused by a germ (virus or bacteria). Some types of pneumonia can spread easily from person to person. This can happen when you cough or sneeze. HOME CARE  Only take medicine as told by your doctor.  Take your medicine (antibiotics) as told. Finish it even if you start to feel better.  Do not smoke.  You may use a  vaporizer or humidifier in your room. This can help loosen thick spit (mucus).  Sleep so you are almost sitting up (semi-upright). This helps reduce coughing.  Rest. A shot (vaccine) can help prevent pneumonia. Shots are often advised for:  People over 72 years old.  Patients on chemotherapy.  People with long-term (chronic) lung problems.  People with immune system problems. GET HELP RIGHT AWAY IF:   You are getting worse.  You cannot control your cough, and you are losing sleep.  You cough up blood.  Your pain gets worse, even with medicine.  You have a fever.  Any of your problems are getting worse, not better.  You have shortness of breath or chest pain. MAKE SURE YOU:   Understand these instructions.  Will watch your condition.  Will get help right away if you are not doing well or get worse. Document Released: 07/16/2007 Document Revised: 04/21/2011 Document Reviewed: 04/19/2010 [facility] Patient Information 2015 [place], [place]. This information is not intended to replace advice given to you by your health care provider. Make sure you discuss any questions you have with your health care provider.

## 2014-06-30 ENCOUNTER — Telehealth: Payer: Self-pay | Admitting: *Deleted

## 2014-07-03 MED ORDER — MORPHINE SULFATE ER 15 MG PO TBCR: 15.0000 mg | EXTENDED_RELEASE_TABLET | Freq: Three times a day (TID) | ORAL | Status: DC

## 2014-07-03 NOTE — Telephone Encounter (Signed)
Prescription ready for pick up Wed

## 2014-07-05 ENCOUNTER — Inpatient Hospital Stay: Payer: Commercial Managed Care - HMO

## 2014-07-05 DIAGNOSIS — C9000 Multiple myeloma not having achieved remission: Secondary | ICD-10-CM | POA: Diagnosis not present

## 2014-07-05 DIAGNOSIS — C9002 Multiple myeloma in relapse: Secondary | ICD-10-CM

## 2014-07-05 LAB — BASIC METABOLIC PANEL
Anion gap: 5 (ref 5–15)
BUN: 28 mg/dL — AB (ref 6–20)
CO2: 29 mmol/L (ref 22–32)
CREATININE: 1.18 mg/dL (ref 0.61–1.24)
Calcium: 8.2 mg/dL — ABNORMAL LOW (ref 8.9–10.3)
Chloride: 98 mmol/L — ABNORMAL LOW (ref 101–111)
GFR calc Af Amer: >60 mL/min (ref >60)
GFR calc non Af Amer: >60 mL/min (ref >60)
GLUCOSE: 243 mg/dL — AB (ref 65–99)
POTASSIUM: 3.2 mmol/L — AB (ref 3.5–5.1)
SODIUM: 132 mmol/L — AB (ref 135–145)

## 2014-07-11 ENCOUNTER — Other Ambulatory Visit: Payer: Self-pay | Admitting: Oncology

## 2014-07-12 ENCOUNTER — Ambulatory Visit: Payer: Medicare Other | Admitting: Oncology

## 2014-07-12 ENCOUNTER — Ambulatory Visit: Payer: Self-pay

## 2014-07-12 ENCOUNTER — Other Ambulatory Visit: Payer: Medicare Other

## 2014-07-12 ENCOUNTER — Telehealth: Payer: Self-pay | Admitting: *Deleted

## 2014-07-12 ENCOUNTER — Ambulatory Visit: Payer: Commercial Managed Care - HMO | Admitting: Oncology

## 2014-07-12 ENCOUNTER — Ambulatory Visit: Payer: Medicare Other

## 2014-07-12 MED ORDER — DIAZEPAM 2 MG PO TABS: 2.0000 mg | ORAL_TABLET | Freq: Every day | ORAL | Status: DC

## 2014-07-12 NOTE — Telephone Encounter (Signed)
Faxed rx

## 2014-07-13 ENCOUNTER — Other Ambulatory Visit: Payer: Self-pay

## 2014-07-13 ENCOUNTER — Inpatient Hospital Stay: Payer: Commercial Managed Care - HMO | Attending: Oncology | Admitting: Oncology

## 2014-07-13 ENCOUNTER — Inpatient Hospital Stay: Payer: Commercial Managed Care - HMO

## 2014-07-13 VITALS — BP 114/72 | HR 73 | Temp 94.4°F | Resp 20

## 2014-07-13 DIAGNOSIS — I10 Essential (primary) hypertension: Secondary | ICD-10-CM | POA: Diagnosis not present

## 2014-07-13 DIAGNOSIS — Z86718 Personal history of other venous thrombosis and embolism: Secondary | ICD-10-CM | POA: Diagnosis not present

## 2014-07-13 DIAGNOSIS — Z794 Long term (current) use of insulin: Secondary | ICD-10-CM | POA: Insufficient documentation

## 2014-07-13 DIAGNOSIS — C9000 Multiple myeloma not having achieved remission: Secondary | ICD-10-CM

## 2014-07-13 DIAGNOSIS — Z79899 Other long term (current) drug therapy: Secondary | ICD-10-CM | POA: Insufficient documentation

## 2014-07-13 DIAGNOSIS — R197 Diarrhea, unspecified: Secondary | ICD-10-CM | POA: Insufficient documentation

## 2014-07-13 DIAGNOSIS — E119 Type 2 diabetes mellitus without complications: Secondary | ICD-10-CM | POA: Diagnosis not present

## 2014-07-13 DIAGNOSIS — C9002 Multiple myeloma in relapse: Secondary | ICD-10-CM

## 2014-07-13 DIAGNOSIS — G8929 Other chronic pain: Secondary | ICD-10-CM | POA: Diagnosis not present

## 2014-07-13 DIAGNOSIS — M62838 Other muscle spasm: Secondary | ICD-10-CM

## 2014-07-13 MED ORDER — SODIUM CHLORIDE 0.9 % IV SOLN
Freq: Once | INTRAVENOUS | Status: AC
  Administered 2014-07-13: 12:00:00 via INTRAVENOUS
  Filled 2014-07-13: qty 1000

## 2014-07-13 MED ORDER — ZOLEDRONIC ACID 4 MG/100ML IV SOLN
4.0000 mg | Freq: Once | INTRAVENOUS | Status: AC
  Administered 2014-07-13: 4 mg via INTRAVENOUS
  Filled 2014-07-13: qty 100

## 2014-07-14 ENCOUNTER — Inpatient Hospital Stay: Payer: Commercial Managed Care - HMO

## 2014-07-14 DIAGNOSIS — C9000 Multiple myeloma not having achieved remission: Secondary | ICD-10-CM | POA: Diagnosis not present

## 2014-07-14 DIAGNOSIS — M62838 Other muscle spasm: Secondary | ICD-10-CM

## 2014-07-14 LAB — MAGNESIUM: Magnesium: 1.9 mg/dL (ref 1.7–2.4)

## 2014-07-15 ENCOUNTER — Encounter: Payer: Self-pay | Admitting: Internal Medicine

## 2014-07-15 DIAGNOSIS — C4490 Unspecified malignant neoplasm of skin, unspecified: Secondary | ICD-10-CM | POA: Insufficient documentation

## 2014-07-15 DIAGNOSIS — G629 Polyneuropathy, unspecified: Secondary | ICD-10-CM | POA: Insufficient documentation

## 2014-07-15 DIAGNOSIS — M25559 Pain in unspecified hip: Secondary | ICD-10-CM | POA: Insufficient documentation

## 2014-07-15 DIAGNOSIS — J439 Emphysema, unspecified: Secondary | ICD-10-CM | POA: Insufficient documentation

## 2014-07-15 DIAGNOSIS — F17200 Nicotine dependence, unspecified, uncomplicated: Secondary | ICD-10-CM | POA: Insufficient documentation

## 2014-07-15 DIAGNOSIS — N400 Enlarged prostate without lower urinary tract symptoms: Secondary | ICD-10-CM | POA: Insufficient documentation

## 2014-07-17 ENCOUNTER — Telehealth: Payer: Self-pay

## 2014-07-17 LAB — PROTEIN ELECTROPHORESIS, SERUM
A/G Ratio: 1 (ref 0.7–2.0)
Albumin ELP: 2.8 g/dL — ABNORMAL LOW (ref 3.2–5.6)
Alpha-1-Globulin: 0.3 g/dL (ref 0.1–0.4)
Alpha-2-Globulin: 0.8 g/dL (ref 0.4–1.2)
Beta Globulin: 0.8 g/dL (ref 0.6–1.3)
GLOBULIN, TOTAL: 2.7 g/dL (ref 2.0–4.5)
Gamma Globulin: 0.8 g/dL (ref 0.5–1.6)
M-Spike, %: 0.5 g/dL — ABNORMAL HIGH
TOTAL PROTEIN ELP: 5.5 g/dL — AB (ref 6.0–8.5)

## 2014-07-17 LAB — PROTEIN ELECTRO, RANDOM URINE
ALBUMIN ELP UR: 13.7 %
ALPHA-1-GLOBULIN, U: 2.1 %
Alpha-2-Globulin, U: 22 %
BETA GLOBULIN, U: 57.9 %
GAMMA GLOBULIN, U: 4.3 %
M Component, Ur: 49.4 % — ABNORMAL HIGH
Total Protein, Urine: 10.9 mg/dL

## 2014-07-17 MED ORDER — NYSTATIN 100000 UNIT/ML MT SUSP: 10.0000 mL | Freq: Four times a day (QID) | OROMUCOSAL | Status: DC

## 2014-07-17 NOTE — Telephone Encounter (Signed)
Nystatin Rx sent to pharmacy.

## 2014-07-17 NOTE — Telephone Encounter (Signed)
Needs refill on Nystatin 100,000 units/ml Susp # 473.0 ml 2 tsp q.i.d. [place]

## 2014-07-18 ENCOUNTER — Telehealth: Payer: Self-pay

## 2014-07-18 NOTE — Telephone Encounter (Signed)
Patient called for M-spike results.  Informed results of M-spike 0.5 and he is says that when it was down before Dr. Grayland Ormond wanted him to stop taking the Revlimid.  He wants to finish the month he just started but do you want him to stop after this month?Marland KitchenEsau Grew

## 2014-07-27 ENCOUNTER — Telehealth: Payer: Self-pay | Admitting: *Deleted

## 2014-07-27 NOTE — Telephone Encounter (Signed)
Called and advised to call PMD

## 2014-07-28 ENCOUNTER — Telehealth: Payer: Self-pay

## 2014-07-28 NOTE — Telephone Encounter (Signed)
Patient called, having swelling in legs, can you Rx a diuretic for him.dr

## 2014-07-28 NOTE — Telephone Encounter (Signed)
Please call patient and advise he needs to be seen for this problem.

## 2014-08-02 ENCOUNTER — Emergency Department: Payer: Commercial Managed Care - HMO

## 2014-08-02 ENCOUNTER — Other Ambulatory Visit: Payer: Self-pay | Admitting: *Deleted

## 2014-08-02 ENCOUNTER — Inpatient Hospital Stay
Admission: EM | Admit: 2014-08-02 | Discharge: 2014-08-04 | DRG: 193 | Disposition: A | Discharge status: Home Health | Payer: Commercial Managed Care - HMO | Attending: Internal Medicine | Admitting: Internal Medicine

## 2014-08-02 ENCOUNTER — Encounter: Payer: Self-pay | Admitting: Emergency Medicine

## 2014-08-02 DIAGNOSIS — B370 Candidal stomatitis: Secondary | ICD-10-CM | POA: Diagnosis present

## 2014-08-02 DIAGNOSIS — Z86718 Personal history of other venous thrombosis and embolism: Secondary | ICD-10-CM | POA: Diagnosis not present

## 2014-08-02 DIAGNOSIS — D696 Thrombocytopenia, unspecified: Secondary | ICD-10-CM | POA: Diagnosis present

## 2014-08-02 DIAGNOSIS — C7902 Secondary malignant neoplasm of left kidney and renal pelvis: Secondary | ICD-10-CM | POA: Diagnosis present

## 2014-08-02 DIAGNOSIS — G629 Polyneuropathy, unspecified: Secondary | ICD-10-CM | POA: Diagnosis present

## 2014-08-02 DIAGNOSIS — G8929 Other chronic pain: Secondary | ICD-10-CM | POA: Diagnosis present

## 2014-08-02 DIAGNOSIS — Z9484 Stem cells transplant status: Secondary | ICD-10-CM | POA: Diagnosis not present

## 2014-08-02 DIAGNOSIS — N2889 Other specified disorders of kidney and ureter: Secondary | ICD-10-CM | POA: Diagnosis present

## 2014-08-02 DIAGNOSIS — D649 Anemia, unspecified: Secondary | ICD-10-CM | POA: Diagnosis present

## 2014-08-02 DIAGNOSIS — J9621 Acute and chronic respiratory failure with hypoxia: Secondary | ICD-10-CM | POA: Diagnosis present

## 2014-08-02 DIAGNOSIS — Z79899 Other long term (current) drug therapy: Secondary | ICD-10-CM

## 2014-08-02 DIAGNOSIS — J441 Chronic obstructive pulmonary disease with (acute) exacerbation: Secondary | ICD-10-CM | POA: Diagnosis not present

## 2014-08-02 DIAGNOSIS — I10 Essential (primary) hypertension: Secondary | ICD-10-CM | POA: Diagnosis present

## 2014-08-02 DIAGNOSIS — Z6821 Body mass index (BMI) 21.0-21.9, adult: Secondary | ICD-10-CM | POA: Diagnosis not present

## 2014-08-02 DIAGNOSIS — J9601 Acute respiratory failure with hypoxia: Secondary | ICD-10-CM | POA: Diagnosis present

## 2014-08-02 DIAGNOSIS — E876 Hypokalemia: Secondary | ICD-10-CM | POA: Diagnosis present

## 2014-08-02 DIAGNOSIS — Z79891 Long term (current) use of opiate analgesic: Secondary | ICD-10-CM | POA: Diagnosis not present

## 2014-08-02 DIAGNOSIS — C9000 Multiple myeloma not having achieved remission: Secondary | ICD-10-CM | POA: Diagnosis present

## 2014-08-02 DIAGNOSIS — J189 Pneumonia, unspecified organism: Principal | ICD-10-CM | POA: Diagnosis present

## 2014-08-02 DIAGNOSIS — F17200 Nicotine dependence, unspecified, uncomplicated: Secondary | ICD-10-CM | POA: Diagnosis present

## 2014-08-02 DIAGNOSIS — F17210 Nicotine dependence, cigarettes, uncomplicated: Secondary | ICD-10-CM | POA: Diagnosis present

## 2014-08-02 DIAGNOSIS — Z515 Encounter for palliative care: Secondary | ICD-10-CM | POA: Diagnosis not present

## 2014-08-02 DIAGNOSIS — E119 Type 2 diabetes mellitus without complications: Secondary | ICD-10-CM

## 2014-08-02 DIAGNOSIS — J9600 Acute respiratory failure, unspecified whether with hypoxia or hypercapnia: Secondary | ICD-10-CM | POA: Diagnosis present

## 2014-08-02 DIAGNOSIS — Z7952 Long term (current) use of systemic steroids: Secondary | ICD-10-CM | POA: Diagnosis not present

## 2014-08-02 DIAGNOSIS — J439 Emphysema, unspecified: Secondary | ICD-10-CM | POA: Diagnosis present

## 2014-08-02 DIAGNOSIS — D709 Neutropenia, unspecified: Secondary | ICD-10-CM | POA: Diagnosis present

## 2014-08-02 LAB — BASIC METABOLIC PANEL
Anion gap: 13 (ref 5–15)
BUN: 36 mg/dL — ABNORMAL HIGH (ref 6–20)
CALCIUM: 8.2 mg/dL — AB (ref 8.9–10.3)
CO2: 32 mmol/L (ref 22–32)
Chloride: 88 mmol/L — ABNORMAL LOW (ref 101–111)
Creatinine, Ser: 1.55 mg/dL — ABNORMAL HIGH (ref 0.61–1.24)
GFR, EST AFRICAN AMERICAN: 51 mL/min — AB (ref >60)
GFR, EST NON AFRICAN AMERICAN: 44 mL/min — AB (ref >60)
Glucose, Bld: 188 mg/dL — ABNORMAL HIGH (ref 65–99)
POTASSIUM: 3 mmol/L — AB (ref 3.5–5.1)
Sodium: 133 mmol/L — ABNORMAL LOW (ref 135–145)

## 2014-08-02 LAB — CBC
HEMATOCRIT: 33.8 % — AB (ref 40.0–52.0)
HEMOGLOBIN: 11.3 g/dL — AB (ref 13.0–18.0)
MCH: 32.7 pg (ref 26.0–34.0)
MCHC: 33.5 g/dL (ref 32.0–36.0)
MCV: 97.5 fL (ref 80.0–100.0)
Platelets: 139 10*3/uL — ABNORMAL LOW (ref 150–440)
RBC: 3.46 MIL/uL — AB (ref 4.40–5.90)
RDW: 16.2 % — ABNORMAL HIGH (ref 11.5–14.5)
WBC: 4.6 10*3/uL (ref 3.8–10.6)

## 2014-08-02 LAB — GLUCOSE, CAPILLARY: Glucose-Capillary: 221 mg/dL — ABNORMAL HIGH (ref 65–99)

## 2014-08-02 LAB — TROPONIN I: Troponin I: 0.03 ng/mL (ref <0.031)

## 2014-08-02 MED ORDER — LEVOFLOXACIN IN D5W 500 MG/100ML IV SOLN
500.0000 mg | INTRAVENOUS | Status: DC
  Administered 2014-08-02: 500 mg via INTRAVENOUS

## 2014-08-02 MED ORDER — BACLOFEN 10 MG PO TABS
10.0000 mg | ORAL_TABLET | Freq: Three times a day (TID) | ORAL | Status: DC | PRN
Start: 2014-08-02 — End: 2014-08-04
  Administered 2014-08-02 – 2014-08-04 (×2): 10 mg via ORAL
  Filled 2014-08-02 (×2): qty 1

## 2014-08-02 MED ORDER — LEVOFLOXACIN IN D5W 500 MG/100ML IV SOLN
INTRAVENOUS | Status: AC
  Administered 2014-08-02: 500 mg via INTRAVENOUS
  Filled 2014-08-02: qty 100

## 2014-08-02 MED ORDER — SODIUM CHLORIDE 0.9 % IV SOLN
INTRAVENOUS | Status: DC
  Administered 2014-08-02 – 2014-08-03 (×2): via INTRAVENOUS

## 2014-08-02 MED ORDER — GABAPENTIN 300 MG PO CAPS
300.0000 mg | ORAL_CAPSULE | Freq: Two times a day (BID) | ORAL | Status: DC
  Administered 2014-08-02 – 2014-08-04 (×4): 300 mg via ORAL
  Filled 2014-08-02 (×4): qty 1

## 2014-08-02 MED ORDER — METHYLPREDNISOLONE SODIUM SUCC 125 MG IJ SOLR
INTRAMUSCULAR | Status: AC
  Administered 2014-08-02: 125 mg via INTRAVENOUS
  Filled 2014-08-02: qty 2

## 2014-08-02 MED ORDER — ALBUTEROL SULFATE (2.5 MG/3ML) 0.083% IN NEBU: 2.5000 mg | INHALATION_SOLUTION | RESPIRATORY_TRACT | Status: DC | PRN

## 2014-08-02 MED ORDER — ONDANSETRON HCL 4 MG/2ML IJ SOLN: 4.0000 mg | Freq: Four times a day (QID) | INTRAMUSCULAR | Status: DC | PRN

## 2014-08-02 MED ORDER — METHYLPREDNISOLONE SODIUM SUCC 125 MG IJ SOLR
125.0000 mg | Freq: Once | INTRAMUSCULAR | Status: AC
Start: 2014-08-02 — End: 2014-08-02
  Administered 2014-08-02: 125 mg via INTRAVENOUS

## 2014-08-02 MED ORDER — LEVALBUTEROL TARTRATE 45 MCG/ACT IN AERO
2.0000 | INHALATION_SPRAY | RESPIRATORY_TRACT | Status: DC | PRN
  Filled 2014-08-02: qty 15

## 2014-08-02 MED ORDER — VALACYCLOVIR HCL 500 MG PO TABS
500.0000 mg | ORAL_TABLET | Freq: Every day | ORAL | Status: DC
  Administered 2014-08-03 – 2014-08-04 (×2): 500 mg via ORAL
  Filled 2014-08-02 (×2): qty 1

## 2014-08-02 MED ORDER — ENOXAPARIN SODIUM 40 MG/0.4ML ~~LOC~~ SOLN
40.0000 mg | SUBCUTANEOUS | Status: DC
  Administered 2014-08-02 – 2014-08-03 (×2): 40 mg via SUBCUTANEOUS
  Filled 2014-08-02 (×2): qty 0.4

## 2014-08-02 MED ORDER — GUAIFENESIN ER 600 MG PO TB12
600.0000 mg | ORAL_TABLET | Freq: Two times a day (BID) | ORAL | Status: DC
  Administered 2014-08-03: 600 mg via ORAL
  Filled 2014-08-02 (×2): qty 1

## 2014-08-02 MED ORDER — DIAZEPAM 2 MG PO TABS
2.0000 mg | ORAL_TABLET | Freq: Every day | ORAL | Status: DC
  Administered 2014-08-02 – 2014-08-03 (×2): 2 mg via ORAL
  Filled 2014-08-02 (×2): qty 1

## 2014-08-02 MED ORDER — ACETAMINOPHEN 650 MG RE SUPP: 650.0000 mg | Freq: Four times a day (QID) | RECTAL | Status: DC | PRN

## 2014-08-02 MED ORDER — IPRATROPIUM-ALBUTEROL 0.5-2.5 (3) MG/3ML IN SOLN
3.0000 mL | RESPIRATORY_TRACT | Status: AC
  Administered 2014-08-02 (×3): 3 mL via RESPIRATORY_TRACT

## 2014-08-02 MED ORDER — IPRATROPIUM-ALBUTEROL 0.5-2.5 (3) MG/3ML IN SOLN
RESPIRATORY_TRACT | Status: AC
  Administered 2014-08-02: 3 mL via RESPIRATORY_TRACT
  Filled 2014-08-02: qty 9

## 2014-08-02 MED ORDER — NYSTATIN 100000 UNIT/ML MT SUSP
10.0000 mL | Freq: Four times a day (QID) | OROMUCOSAL | Status: DC
  Administered 2014-08-03 – 2014-08-04 (×4): 1000000 [IU] via ORAL
  Filled 2014-08-02 (×5): qty 10

## 2014-08-02 MED ORDER — INSULIN ASPART 100 UNIT/ML ~~LOC~~ SOLN
0.0000 [IU] | Freq: Three times a day (TID) | SUBCUTANEOUS | Status: DC
  Administered 2014-08-02 – 2014-08-03 (×2): 3 [IU] via SUBCUTANEOUS
  Administered 2014-08-03 (×2): 2 [IU] via SUBCUTANEOUS
  Administered 2014-08-03: 10:00:00 5 [IU] via SUBCUTANEOUS
  Administered 2014-08-04: 14:00:00 2 [IU] via SUBCUTANEOUS
  Filled 2014-08-02: qty 3
  Filled 2014-08-02: qty 2
  Filled 2014-08-02: qty 3
  Filled 2014-08-02: qty 2
  Filled 2014-08-02: qty 5
  Filled 2014-08-02 (×2): qty 2

## 2014-08-02 MED ORDER — FLUTICASONE PROPIONATE 50 MCG/ACT NA SUSP
2.0000 | Freq: Every day | NASAL | Status: DC
  Administered 2014-08-02 – 2014-08-04 (×3): 2 via NASAL
  Filled 2014-08-02: qty 16

## 2014-08-02 MED ORDER — LEVALBUTEROL HCL 1.25 MG/0.5ML IN NEBU
1.2500 mg | INHALATION_SOLUTION | Freq: Three times a day (TID) | RESPIRATORY_TRACT | Status: DC
  Administered 2014-08-03 (×2): 1.25 mg via RESPIRATORY_TRACT
  Filled 2014-08-02 (×2): qty 0.5

## 2014-08-02 MED ORDER — LEVOFLOXACIN IN D5W 500 MG/100ML IV SOLN
500.0000 mg | INTRAVENOUS | Status: DC
  Administered 2014-08-03: 500 mg via INTRAVENOUS
  Filled 2014-08-02 (×2): qty 100

## 2014-08-02 MED ORDER — METHYLPREDNISOLONE SODIUM SUCC 125 MG IJ SOLR
60.0000 mg | Freq: Four times a day (QID) | INTRAMUSCULAR | Status: DC
  Administered 2014-08-02 – 2014-08-03 (×3): 60 mg via INTRAVENOUS
  Filled 2014-08-02 (×3): qty 2

## 2014-08-02 MED ORDER — SODIUM CHLORIDE 0.9 % IV BOLUS (SEPSIS)
1000.0000 mL | Freq: Once | INTRAVENOUS | Status: AC
  Administered 2014-08-02: 1000 mL via INTRAVENOUS

## 2014-08-02 MED ORDER — GLIMEPIRIDE 2 MG PO TABS
2.0000 mg | ORAL_TABLET | Freq: Two times a day (BID) | ORAL | Status: DC
  Administered 2014-08-03 (×2): 2 mg via ORAL
  Filled 2014-08-02 (×2): qty 1

## 2014-08-02 MED ORDER — MORPHINE SULFATE ER 15 MG PO TBCR
15.0000 mg | EXTENDED_RELEASE_TABLET | Freq: Three times a day (TID) | ORAL | Status: DC
  Administered 2014-08-02 – 2014-08-04 (×5): 15 mg via ORAL
  Filled 2014-08-02 (×5): qty 1

## 2014-08-02 MED ORDER — MORPHINE SULFATE ER 15 MG PO TBCR: 15.0000 mg | EXTENDED_RELEASE_TABLET | Freq: Three times a day (TID) | ORAL | Status: DC

## 2014-08-02 MED ORDER — MELATONIN 1 MG PO TABS: 1.0000 mg | ORAL_TABLET | Freq: Every day | ORAL | Status: DC

## 2014-08-02 MED ORDER — PANTOPRAZOLE SODIUM 40 MG PO TBEC
40.0000 mg | DELAYED_RELEASE_TABLET | Freq: Every day | ORAL | Status: DC
  Administered 2014-08-03: 10:00:00 40 mg via ORAL
  Filled 2014-08-02 (×2): qty 1

## 2014-08-02 MED ORDER — LENALIDOMIDE 10 MG PO CAPS
10.0000 mg | ORAL_CAPSULE | Freq: Every day | ORAL | Status: DC
  Administered 2014-08-02 – 2014-08-03 (×2): 10 mg via ORAL
  Filled 2014-08-02: qty 1

## 2014-08-02 MED ORDER — LEVALBUTEROL HCL 1.25 MG/0.5ML IN NEBU
1.2500 mg | INHALATION_SOLUTION | Freq: Three times a day (TID) | RESPIRATORY_TRACT | Status: DC
  Administered 2014-08-02: 1.25 mg via RESPIRATORY_TRACT
  Filled 2014-08-02: qty 0.5

## 2014-08-02 MED ORDER — PROCHLORPERAZINE MALEATE 10 MG PO TABS: 10.0000 mg | ORAL_TABLET | Freq: Four times a day (QID) | ORAL | Status: DC | PRN

## 2014-08-02 MED ORDER — IOHEXOL 350 MG/ML SOLN
60.0000 mL | Freq: Once | INTRAVENOUS | Status: AC | PRN
Start: 2014-08-02 — End: 2014-08-02
  Administered 2014-08-02: 60 mL via INTRAVENOUS

## 2014-08-02 MED ORDER — ONDANSETRON HCL 4 MG PO TABS: 4.0000 mg | ORAL_TABLET | Freq: Four times a day (QID) | ORAL | Status: DC | PRN

## 2014-08-02 MED ORDER — IBUPROFEN 400 MG PO TABS
800.0000 mg | ORAL_TABLET | Freq: Three times a day (TID) | ORAL | Status: DC | PRN
  Administered 2014-08-02: 800 mg via ORAL
  Filled 2014-08-02: qty 2

## 2014-08-02 MED ORDER — ACETAMINOPHEN 325 MG PO TABS: 650.0000 mg | ORAL_TABLET | Freq: Four times a day (QID) | ORAL | Status: DC | PRN

## 2014-08-02 MED ORDER — PREDNISOLONE ACETATE 1 % OP SUSP
1.0000 [drp] | Freq: Two times a day (BID) | OPHTHALMIC | Status: DC
  Administered 2014-08-02 – 2014-08-04 (×4): 1 [drp] via OPHTHALMIC
  Filled 2014-08-02: qty 1

## 2014-08-02 NOTE — ED Notes (Signed)
Patient transported to CT 

## 2014-08-02 NOTE — ED Notes (Signed)
Patient with hx multiple myeloma; currently receiving oral chemo. Presents with shortness of breath. o2 sats 82-86% on room air upon arrival

## 2014-08-02 NOTE — ED Provider Notes (Signed)
[facility] Emergency Department Provider Note  ____________________________________________  Time seen: 2:30 PM  I have reviewed the triage vital signs and the nursing notes.   HISTORY  Chief Complaint Shortness of Breath    HPI Russell Keller is a 69 y.o. male who complains of acute on chronic shortness of breath, which is been worse in the last 2 days. He also has increased nonproductive cough. Denies any chest pain. He does report feeling very fatigued over the last 2 days as well, and this morning around 3 AM he had a syncopal episode at home. He denies any preceding chest pain abdominal pain back pain headache or other significant symptoms, and did not have any significant pain anywhere after he woke woke back up. He does not think he sustained any injury. No recent illness running nose or fever. The patient has a home pulse oximeter, which is normally around 94%. Yesterday it was 82% on room air. He used a nebulizer treatment and it increased to 92% for about one hour and then decreased again. The patient is supposed to be on home oxygen therapy, but he does not use it because he does not want to pay the charges for it. His family reports that they are able to afford it, but the patient refuses it.  Patient does have a history of DVT, and takes Revlimid which has a side effect of increased risk of venous thromboembolism. He is currently receiving chemotherapy for multiple myeloma. The patient feels that his symptoms are entirely due to his emphysema. He also has not been taking much fluids for the past few weeks. He denies any abdominal pain nausea vomiting or diarrhea.        Past Medical History  Diagnosis Date  . DVT (deep venous thrombosis)   . Diabetes mellitus without complication   . Hypertension   . COPD (chronic obstructive pulmonary disease)   . Plasmocytoma   . Multiple myeloma     Patient Active Problem List   Diagnosis Date  Noted  . Benign prostatic hypertrophy without urinary obstruction 07/15/2014  . Current smoker 07/15/2014  . Arthralgia of hip 07/15/2014  . Acquired polyneuropathy 07/15/2014  . Chronic obstructive pulmonary emphysema 07/15/2014  . CA of skin 07/15/2014  . Muscle spasm 07/13/2014  . Community acquired pneumonia 06/17/2014  . Uncomplicated type 2 diabetes mellitus 06/17/2014  . Essential hypertension 06/17/2014  . DVT (deep venous thrombosis)   . Plasmocytoma   . Multiple myeloma   . Multiple myeloma in relapse 06/04/2014    Past Surgical History  Procedure Laterality Date  . Bone marrow transplant      Multiple Myleoma  . Tonsillectomy    . Colon, bladder fistula repair  2012    with colostomy  . Multiple myeloma    . Cataract Right   . Colostomy takedown  2012    Current Outpatient Rx  Name  Route  Sig  Dispense  Refill  . baclofen (LIORESAL) 10 MG tablet   Oral   Take 10 mg by mouth 3 (three) times daily as needed for muscle spasms.       0   . dexamethasone (DECADRON) 4 MG tablet   Oral   Take 20 mg by mouth every 7 (seven) days. Take 5 tablets.  4 mg tabs every Monday         . diazepam (VALIUM) 2 MG tablet   Oral   Take 1 tablet (2 mg total) by mouth at  bedtime.   30 tablet   0   . fluticasone (FLONASE) 50 MCG/ACT nasal spray   Each Nare   Place 2 sprays into both nostrils daily.      5   . gabapentin (NEURONTIN) 300 MG capsule   Oral   Take 300 mg by mouth 2 (two) times daily.         Marland Kitchen glimepiride (AMARYL) 2 MG tablet   Oral   Take 2 mg by mouth 2 (two) times daily before lunch and supper.         Marland Kitchen ibuprofen (ADVIL,MOTRIN) 800 MG tablet   Oral   Take 800 mg by mouth every 8 (eight) hours as needed for moderate pain.          Marland Kitchen lenalidomide (REVLIMID) 10 MG capsule   Oral   Take 10 mg by mouth daily.         Marland Kitchen levalbuterol (XOPENEX HFA) 45 MCG/ACT inhaler   Inhalation   Inhale 2 puffs into the lungs every 4 (four) hours as  needed for wheezing.         Marland Kitchen levofloxacin (LEVAQUIN) 500 MG tablet   Oral   Take 1 tablet (500 mg total) by mouth daily. Patient not taking: Reported on 07/13/2014   7 tablet   1   . Melatonin 1 MG TABS   Oral   Take 1 mg by mouth at bedtime.         Marland Kitchen morphine (MS CONTIN) 15 MG 12 hr tablet   Oral   Take 1 tablet (15 mg total) by mouth every 8 (eight) hours.   90 tablet   0   . nystatin (MYCOSTATIN) 100000 UNIT/ML suspension   Oral   Take 10 mLs (1,000,000 Units total) by mouth 4 (four) times daily.   473 mL   2   . omeprazole (PRILOSEC) 20 MG capsule   Oral   Take 20 mg by mouth daily as needed. For heartburn         . prednisoLONE acetate (PRED FORTE) 1 % ophthalmic suspension   Right Eye   Place 1 drop into the right eye 2 (two) times daily.         . predniSONE (DELTASONE) 10 MG tablet   Oral   Take 1 tablet (10 mg total) by mouth daily. Label  & dispense according to the schedule below. 5 Pills PO for 1 day then, 4 Pills PO for 1 day, 3 Pills PO for 1 day, 2 Pills PO for 1 day1, 1 Pill PO for 1 days then STOP. Patient not taking: Reported on 07/13/2014   15 tablet   1   . prochlorperazine (COMPAZINE) 10 MG tablet   Oral   Take 10 mg by mouth every 6 (six) hours as needed for nausea or vomiting.       1   . valACYclovir (VALTREX) 500 MG tablet   Oral   Take 500 mg by mouth daily.      5     Allergies No known allergies  Family History  Problem Relation Age of Onset  . Diabetes Mother   . Cancer Mother     Social History History  Substance Use Topics  . Smoking status: Current Every Day Smoker -- 0.50 packs/day for 50 years    Types: Cigarettes  . Smokeless tobacco: Never Used  . Alcohol Use: No    Review of Systems  Constitutional: No fever or chills. No weight changes Eyes:No  blurry vision or double vision.  ENT: No sore throat. Cardiovascular: No chest pain. Respiratory: Shortness of breath and cough as  above. Gastrointestinal: Negative for abdominal pain, vomiting and diarrhea.  No BRBPR or melena. Genitourinary: Negative for dysuria, urinary retention, bloody urine, or difficulty urinating. Musculoskeletal: Negative for back pain. No joint swelling or pain. Skin: Negative for rash. Neurological: Negative for headaches, focal weakness or numbness. Psychiatric:No anxiety or depression.   Endocrine:No hot/cold intolerance, changes in energy, or sleep difficulty.  10-point ROS otherwise negative.  ____________________________________________   PHYSICAL EXAM:  VITAL SIGNS: ED Triage Vitals  Enc Vitals Group     BP 08/02/14 1326 138/78 mmHg     Pulse Rate 08/02/14 1308 84     Resp 08/02/14 1308 22     Temp 08/02/14 1308 98 F (36.7 C)     Temp Source 08/02/14 1308 Oral     SpO2 08/02/14 1308 85 %     Weight 08/02/14 1308 150 lb (68.04 kg)     Height 08/02/14 1308 _0  (1.778 m)     Head Cir --      Peak Flow --      Pain Score 08/02/14 1310 0     Pain Loc --      Pain Edu? --      Excl. in [place]? --      Constitutional: Alert and oriented. Well appearing and in no distress. Eyes: No scleral icterus. No conjunctival pallor. PERRL. EOMI ENT   Head: Normocephalic and atraumatic.   Nose: No congestion/rhinnorhea. No septal hematoma   Mouth/Throat: Dry mucous membranes, no pharyngeal erythema. No peritonsillar mass. No uvula shift.   Neck: No stridor. No SubQ emphysema. No meningismus. Hematological/Lymphatic/Immunilogical: No cervical lymphadenopathy. Cardiovascular: RRR. Normal and symmetric distal pulses are present in all extremities. No murmurs, rubs, or gallops. Respiratory: Diminished breath sounds diffusely, equal breath sounds, diffuse expiratory wheezing and prolonged expirations. No rales or crackles. Gastrointestinal: Soft and nontender. No distention. There is no CVA tenderness.  No rebound, rigidity, or guarding. Genitourinary:  deferred Musculoskeletal: Nontender with normal range of motion in all extremities. No joint effusions.  No lower extremity tenderness.  Trace pitting edema bilateral ankles.. Neurologic:   Normal speech and language.  CN 2-10 normal. Motor grossly intact. No pronator drift.  Normal gait. No gross focal neurologic deficits are appreciated.  Skin:  Skin is warm, dry and intact. No rash noted.  No petechiae, purpura, or bullae. Psychiatric: Mood and affect are normal. Speech and behavior are normal. Patient exhibits appropriate insight and judgment.  ____________________________________________    LABS (pertinent positives/negatives) (all labs ordered are listed, but only abnormal results are displayed) Labs Reviewed  BASIC METABOLIC PANEL - Abnormal; Notable for the following:    Sodium 133 (*)    Potassium 3.0 (*)    Chloride 88 (*)    Glucose, Bld 188 (*)    BUN 36 (*)    Creatinine, Ser 1.55 (*)    Calcium 8.2 (*)    GFR calc non Af Amer 44 (*)    GFR calc Af Amer 51 (*)    All other components within normal limits  CBC - Abnormal; Notable for the following:    RBC 3.46 (*)    Hemoglobin 11.3 (*)    HCT 33.8 (*)    RDW 16.2 (*)    Platelets 139 (*)    All other components within normal limits  TROPONIN I   ____________________________________________   EKG  Interpreted by me Normal sinus rhythm rate of 78, normal axis intervals, incomplete right bundle branch block, ST segments and T waves  ____________________________________________    RADIOLOGY  Chest x-ray consistent with emphysema, otherwise unremarkable  ____________________________________________   PROCEDURES  ____________________________________________   INITIAL IMPRESSION / ASSESSMENT AND PLAN / ED COURSE  Pertinent labs & imaging results that were available during my care of the patient were reviewed by me and considered in my medical decision making (see chart for details).  Patient  presents with dehydration and COPD exacerbation, but with his history of DVT, Revlimid, and hypoxic respiratory failure, we will do a CT angiogram of the chest to evaluate for pulmonary embolism. We'll give him IV fluids, Solu-Medrol, DuoNeb's in the meantime to be in treating his COPD exacerbation and reassessed to determine whether the patient will need to be admitted for further management. He does have acute renal insufficiency with a cranial 1.5 on a baseline of 0.8, which is consistent with his dehydration. Speck this will improve with IV fluids. ----------------------------------------- 5:35 PM on 08/02/2014 -----------------------------------------  Family informed of new finding of renal mass concerning for renal cell carcinoma. They can follow up with Dr. Grayland Ormond for continued evaluation of this finding. On repeat assessment, patient remains hypoxic to 84% on room air with persistent diffuse wheezing and prolonged expirations despite nebs and steroids. Again he does not have a home oxygen set up due to prior refusal. At this point we'll admit him to the hospital for COPD exacerbation requiring supplemental oxygen, frequent nebulization treatments, and steroids. ____________________________________________   FINAL CLINICAL IMPRESSION(S) / ED DIAGNOSES  Final diagnoses:  Acute exacerbation of emphysema  Acute respiratory failure with hypoxia   mild hypokalemia Acute exacerbation of chronic emphysema    Carrie Mew, MD 08/02/14 1736

## 2014-08-02 NOTE — ED Notes (Signed)
Patient returned to room. 

## 2014-08-02 NOTE — Telephone Encounter (Signed)
Informed that prescription is ready to pick up  

## 2014-08-02 NOTE — H&P (Signed)
[facility] NAME: Russell Keller    MR#:  [id number]  DATE OF BIRTH:  15-Nov-1945  DATE OF ADMISSION:  08/02/2014  PRIMARY CARE PHYSICIAN: Halina Maidens, MD   REQUESTING/REFERRING PHYSICIAN:   CHIEF COMPLAINT:   Chief Complaint  Patient presents with  . Shortness of Breath    HISTORY OF PRESENT ILLNESS: Russell Keller  is a 69 y.o. male with a known history of multiple myeloma, COPD, diabetes type 2 who was recently hospitalized about 3 weeks ago with similar type presentation presents with shortness of breath ongoing for 2 weeks now. Patient also reports that his has a dry cough and am unable to produce any oxygen. Patient has a home pulse ox meter and his sats were running low in the 80s. And due to his worsening respiratory status he decided come to the ED. In the ER patient had a CT scan of the chest which did reveal patchy airspace disease in the right lower lobe as well as a right upper pole kidney mass.  And cough PAST MEDICAL HISTORY:   Past Medical History  Diagnosis Date  . DVT (deep venous thrombosis)   . Diabetes mellitus without complication   . Hypertension   . COPD (chronic obstructive pulmonary disease)   . Plasmocytoma   . Multiple myeloma     PAST SURGICAL HISTORY:  Past Surgical History  Procedure Laterality Date  . Bone marrow transplant      Multiple Myleoma  . Tonsillectomy    . Colon, bladder fistula repair  2012    with colostomy  . Multiple myeloma    . Cataract Right   . Colostomy takedown  2012    SOCIAL HISTORY:  History  Substance Use Topics  . Smoking status: Current Every Day Smoker -- 0.50 packs/day for 50 years    Types: Cigarettes  . Smokeless tobacco: Never Used  . Alcohol Use: No    FAMILY HISTORY:  Family History  Problem Relation Age of Onset  . Diabetes Mother   . Cancer Mother     DRUG ALLERGIES:  Allergies  Allergen Reactions  . No Known Allergies      REVIEW OF SYSTEMS:   CONSTITUTIONAL: No fever, fatigue or weakness.  EYES: No blurred or double vision.  EARS, NOSE, AND THROAT: No tinnitus or ear pain. Positive thrush RESPIRATORY: Positive cough, positive shortness of breath, wheezing or hemoptysis.  CARDIOVASCULAR: No chest pain, orthopnea, edema.  GASTROINTESTINAL: No nausea, vomiting, diarrhea or abdominal pain.  GENITOURINARY: No dysuria, hematuria.  ENDOCRINE: No polyuria, nocturia,  HEMATOLOGY: No anemia, easy bruising or bleeding SKIN: No rash or lesion. MUSCULOSKELETAL: No joint pain or arthritis.   NEUROLOGIC: No tingling, numbness, weakness.  PSYCHIATRY: No anxiety or depression.   MEDICATIONS AT HOME:  Prior to Admission medications   Medication Sig Start Date End Date Taking? Authorizing Provider  baclofen (LIORESAL) 10 MG tablet Take 10 mg by mouth 3 (three) times daily as needed for muscle spasms.  04/12/14   Historical Provider, MD  dexamethasone (DECADRON) 4 MG tablet Take 20 mg by mouth every 7 (seven) days. Take 5 tablets.  4 mg tabs every Monday    Historical Provider, MD  diazepam (VALIUM) 2 MG tablet Take 1 tablet (2 mg total) by mouth at bedtime. 07/12/14   Lloyd Huger, MD  fluticasone (FLONASE) 50 MCG/ACT nasal spray Place 2 sprays into both nostrils daily. 06/28/14   Historical Provider, MD  gabapentin (NEURONTIN) 300 MG capsule Take 300 mg by mouth 2 (two) times daily.    Historical Provider, MD  glimepiride (AMARYL) 2 MG tablet Take 2 mg by mouth 2 (two) times daily before lunch and supper.    Historical Provider, MD  ibuprofen (ADVIL,MOTRIN) 800 MG tablet Take 800 mg by mouth every 8 (eight) hours as needed for moderate pain.     Historical Provider, MD  lenalidomide (REVLIMID) 10 MG capsule Take 10 mg by mouth daily.    Historical Provider, MD  levalbuterol [facility] HFA) 45 MCG/ACT inhaler Inhale 2 puffs into the lungs every 4 (four) hours as needed for wheezing.    Historical Provider, MD   levofloxacin (LEVAQUIN) 500 MG tablet Take 1 tablet (500 mg total) by mouth daily. Patient not taking: Reported on 07/13/2014 06/18/14   Henreitta Leber, MD  Melatonin 1 MG TABS Take 1 mg by mouth at bedtime.    Historical Provider, MD  morphine (MS CONTIN) 15 MG 12 hr tablet Take 1 tablet (15 mg total) by mouth every 8 (eight) hours. 08/02/14   Forest Gleason, MD  nystatin (MYCOSTATIN) 100000 UNIT/ML suspension Take 10 mLs (1,000,000 Units total) by mouth 4 (four) times daily. 07/17/14   Glean Hess, MD  omeprazole (PRILOSEC) 20 MG capsule Take 20 mg by mouth daily as needed. For heartburn    Historical Provider, MD  prednisoLONE acetate (PRED FORTE) 1 % ophthalmic suspension Place 1 drop into the right eye 2 (two) times daily.    Historical Provider, MD  predniSONE (DELTASONE) 10 MG tablet Take 1 tablet (10 mg total) by mouth daily. Label  & dispense according to the schedule below. 5 Pills PO for 1 day then, 4 Pills PO for 1 day, 3 Pills PO for 1 day, 2 Pills PO for 1 day1, 1 Pill PO for 1 days then STOP. Patient not taking: Reported on 07/13/2014 06/18/14   Henreitta Leber, MD  prochlorperazine (COMPAZINE) 10 MG tablet Take 10 mg by mouth every 6 (six) hours as needed for nausea or vomiting.  03/24/14   Historical Provider, MD  valACYclovir (VALTREX) 500 MG tablet Take 500 mg by mouth daily. 05/24/14   Historical Provider, MD      PHYSICAL EXAMINATION:   VITAL SIGNS: Blood pressure 126/67, pulse 84, temperature 98 F (36.7 C), temperature source Oral, resp. rate 16, height 5' 10"  (1.778 m), weight 68.04 kg (150 lb), SpO2 96 %.  GENERAL:  69 y.o.-year-old patient lying in the bed with mild acute distress.  EYES: Pupils equal, round, reactive to light and accommodation. No scleral icterus. Extraocular muscles intact.  HEENT: Head atraumatic, normocephalic. Oropharynx and nasopharynx clear.  NECK:  Supple, no jugular venous distention. No thyroid enlargement, no tenderness.  LUNGS: Decreased breath  sounds bilaterally with wheezing, no rales,rhonchi or crepitation. No use of accessory muscles of respiration.  CARDIOVASCULAR: S1, S2 normal. No murmurs, rubs, or gallops.  ABDOMEN: Soft, nontender, nondistended. Bowel sounds present. No organomegaly or mass.  EXTREMITIES: No pedal edema, cyanosis, or clubbing.  NEUROLOGIC: Cranial nerves II through XII are intact. Muscle strength 5/5 in all extremities. Sensation intact. Gait not checked.  PSYCHIATRIC: The patient is alert and oriented x 3.  SKIN: No obvious rash, lesion, or ulcer.   LABORATORY PANEL:   CBC  Recent Labs Lab 08/02/14 1330  WBC 4.6  HGB 11.3*  HCT 33.8*  PLT 139*  MCV 97.5  MCH 32.7  MCHC 33.5  RDW 16.2*   ------------------------------------------------------------------------------------------------------------------  Chemistries   Recent Labs Lab 08/02/14 1330  NA 133*  K 3.0*  CL 88*  CO2 32  GLUCOSE 188*  BUN 36*  CREATININE 1.55*  CALCIUM 8.2*   ------------------------------------------------------------------------------------------------------------------ estimated creatinine clearance is 42.7 mL/min (by C-G formula based on Cr of 1.55). ------------------------------------------------------------------------------------------------------------------ No results for input(s): TSH, T4TOTAL, T3FREE, THYROIDAB in the last 72 hours.  Invalid input(s): FREET3   Coagulation profile No results for input(s): INR, PROTIME in the last 168 hours. ------------------------------------------------------------------------------------------------------------------- No results for input(s): DDIMER in the last 72 hours. -------------------------------------------------------------------------------------------------------------------  Cardiac Enzymes  Recent Labs Lab 08/02/14 1330  TROPONINI 0.03    ------------------------------------------------------------------------------------------------------------------ Invalid input(s): POCBNP  ---------------------------------------------------------------------------------------------------------------  Urinalysis No results found for: COLORURINE, APPEARANCEUR, LABSPEC, PHURINE, GLUCOSEU, HGBUR, BILIRUBINUR, KETONESUR, PROTEINUR, UROBILINOGEN, NITRITE, LEUKOCYTESUR   RADIOLOGY: Ct Angio Chest Pe W/cm &/or Wo Cm  08/02/2014   CLINICAL DATA:  Shortness of breath.  History of multiple myeloma  EXAM: CT ANGIOGRAPHY CHEST WITH CONTRAST  TECHNIQUE: Multidetector CT imaging of the chest was performed using the standard protocol during bolus administration of intravenous contrast. Multiplanar CT image reconstructions and MIPs were obtained to evaluate the vascular anatomy.  CONTRAST:  60m OMNIPAQUE IOHEXOL 350 MG/ML SOLN  COMPARISON:  Chest radiograph August 02, 2014 ; CT abdomen and pelvis May 17, 2010  FINDINGS: There is no demonstrable pulmonary embolus. There is no thoracic aortic aneurysm or dissection. There is atherosclerotic change in the aorta. There are scattered small foci of coronary artery calcification. The pericardium is not thickened. There is patchy airspace disease in the right lower lobe, felt to represent foci of pneumonia in the right lower lobe. There is mild bibasilar atelectatic change. There is underlying emphysematous change. There is slight lower lobe bronchiectatic change.  Thyroid appears unremarkable.  There is no demonstrable adenopathy.  In the visualized upper abdomen, there is hepatic steatosis. There is a mass arising from the upper pole of the left kidney measuring 2.8 x 2.4 cm which is solid and is larger compared to prior abdominal CT from 2012.  There is an endplate fracture along the superior aspect of T12. There are small lytic lesions in the T6, T7, and T9 vertebral bodies, felt to be consistent with known multiple  myeloma. There is a lytic lesion involving the post rib medial left scapula measuring 5.1 x 2.8 cm.  Review of the MIP images confirms the above findings.  IMPRESSION: No demonstrable pulmonary embolus.  Underlying emphysema. Patchy pneumonia right lower lobe. Mild bibasilar atelectasis. Mild lower lobe bronchiectatic change.  No adenopathy.  Solid mass arising from the upper pole left kidney measuring 2.8 x 2.4 cm. 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  Lytic lesions in several thoracic vertebral bodies in the right scapula consistent with multiple myeloma.  These results were called by telephone at the time of interpretation on 08/02/2014 at 4:21 pm to Dr. SClearnce Hasten ED phyiscian, who verbally acknowledged these results.   Electronically Signed   By: WLowella GripIII M.D.   On: 08/02/2014 16:25   Dg Chest Port 1 View  08/02/2014   CLINICAL DATA:  Initial encounter for shortness of breath and syncope today.  EXAM: PORTABLE CHEST - 1 VIEW  COMPARISON:  06/17/2014.  FINDINGS: 1337 hrs. Lungs are hyperexpanded without edema or focal airspace consolidation. No pleural effusion. The cardiopericardial silhouette is within normal limits for size. Imaged bony structures of the thorax are intact. Telemetry leads overlie the chest.  IMPRESSION: Emphysema without acute cardiopulmonary findings.   Electronically Signed  By: Misty Stanley M.D.   On: 08/02/2014 13:49    EKG: Orders placed or performed during the hospital encounter of 08/02/14  . ED EKG  . ED EKG  . EKG 12-Lead  . EKG 12-Lead    IMPRESSION AND PLAN: Patient is a 69 year old white male with history of COPD multiple myeloma presents with shortness of breath  1. Acute on chronic respiratory failure due to combination of acute on chronic COPD exasperation as well as a pneumonia. At this time will treat patient with nebulizers steroid and an T biotics. Admission next to his current regimen. Continue his other treatment as doing at home.  2. New kidney  mass: Patient and family request to be seen by Dr. Grayland Ormond will ask him to see the patient he may need a MRI  3. Multiple myeloma   4. Oral thrush continue nystatin  swish and swallow.  5. Diabetes continue Amaryl as taking at home will place him on sliding scale insulin  6. Chronic pain continue morphine as taking at home.  7. [place] Lovenox for DVT prophylaxis    All the records are reviewed and case discussed with ED provider. Management plans discussed with the patient, family and they are in agreement.  CODE STATUS:    TOTAL TIME TAKING CARE OF THIS PATIENT 55 minutes.    Dustin Flock M.D on 08/02/2014 at 6:14 PM  Between 7am to 6pm - Pager - [phone]  After 6pm go to [email or web address] - password [email or web address]  Office  [phone]  CC: Primary care physician; Halina Maidens, MD

## 2014-08-03 ENCOUNTER — Inpatient Hospital Stay: Payer: Commercial Managed Care - HMO

## 2014-08-03 DIAGNOSIS — C9000 Multiple myeloma not having achieved remission: Secondary | ICD-10-CM

## 2014-08-03 DIAGNOSIS — J9600 Acute respiratory failure, unspecified whether with hypoxia or hypercapnia: Secondary | ICD-10-CM | POA: Diagnosis present

## 2014-08-03 DIAGNOSIS — I10 Essential (primary) hypertension: Secondary | ICD-10-CM

## 2014-08-03 DIAGNOSIS — N2889 Other specified disorders of kidney and ureter: Secondary | ICD-10-CM

## 2014-08-03 DIAGNOSIS — Z79899 Other long term (current) drug therapy: Secondary | ICD-10-CM

## 2014-08-03 DIAGNOSIS — Z515 Encounter for palliative care: Secondary | ICD-10-CM

## 2014-08-03 DIAGNOSIS — J189 Pneumonia, unspecified organism: Principal | ICD-10-CM

## 2014-08-03 DIAGNOSIS — Z9481 Bone marrow transplant status: Secondary | ICD-10-CM

## 2014-08-03 DIAGNOSIS — M899 Disorder of bone, unspecified: Secondary | ICD-10-CM

## 2014-08-03 DIAGNOSIS — Z86718 Personal history of other venous thrombosis and embolism: Secondary | ICD-10-CM

## 2014-08-03 DIAGNOSIS — J441 Chronic obstructive pulmonary disease with (acute) exacerbation: Secondary | ICD-10-CM

## 2014-08-03 DIAGNOSIS — E119 Type 2 diabetes mellitus without complications: Secondary | ICD-10-CM

## 2014-08-03 DIAGNOSIS — Z923 Personal history of irradiation: Secondary | ICD-10-CM

## 2014-08-03 LAB — BASIC METABOLIC PANEL
ANION GAP: 9 (ref 5–15)
ANION GAP: 12 (ref 5–15)
BUN: 23 mg/dL — ABNORMAL HIGH (ref 6–20)
BUN: 21 mg/dL — ABNORMAL HIGH (ref 6–20)
CO2: 33 mmol/L — AB (ref 22–32)
CO2: 31 mmol/L (ref 22–32)
Calcium: 7.8 mg/dL — ABNORMAL LOW (ref 8.9–10.3)
Calcium: 7.8 mg/dL — ABNORMAL LOW (ref 8.9–10.3)
Chloride: 95 mmol/L — ABNORMAL LOW (ref 101–111)
Chloride: 94 mmol/L — ABNORMAL LOW (ref 101–111)
Creatinine, Ser: 1.1 mg/dL (ref 0.61–1.24)
Creatinine, Ser: 1.06 mg/dL (ref 0.61–1.24)
GFR calc Af Amer: >60 mL/min (ref >60)
GFR calc Af Amer: >60 mL/min (ref >60)
GFR calc non Af Amer: >60 mL/min (ref >60)
GLUCOSE: 184 mg/dL — AB (ref 65–99)
Glucose, Bld: 181 mg/dL — ABNORMAL HIGH (ref 65–99)
POTASSIUM: 2.6 mmol/L — AB (ref 3.5–5.1)
Potassium: 2.7 mmol/L — CL (ref 3.5–5.1)
SODIUM: 137 mmol/L (ref 135–145)
SODIUM: 137 mmol/L (ref 135–145)

## 2014-08-03 LAB — CBC
HCT: 28.9 % — ABNORMAL LOW (ref 40.0–52.0)
HEMOGLOBIN: 9.7 g/dL — AB (ref 13.0–18.0)
MCH: 32.8 pg (ref 26.0–34.0)
MCHC: 33.7 g/dL (ref 32.0–36.0)
MCV: 97.4 fL (ref 80.0–100.0)
Platelets: 125 10*3/uL — ABNORMAL LOW (ref 150–440)
RBC: 2.96 MIL/uL — AB (ref 4.40–5.90)
RDW: 16.1 % — ABNORMAL HIGH (ref 11.5–14.5)
WBC: 3 10*3/uL — ABNORMAL LOW (ref 3.8–10.6)

## 2014-08-03 LAB — GLUCOSE, CAPILLARY
GLUCOSE-CAPILLARY: 234 mg/dL — AB (ref 65–99)
GLUCOSE-CAPILLARY: 164 mg/dL — AB (ref 65–99)
GLUCOSE-CAPILLARY: 199 mg/dL — AB (ref 65–99)
Glucose-Capillary: 255 mg/dL — ABNORMAL HIGH (ref 65–99)

## 2014-08-03 LAB — MAGNESIUM: MAGNESIUM: 2 mg/dL (ref 1.7–2.4)

## 2014-08-03 LAB — POTASSIUM: Potassium: 2.9 mmol/L — CL (ref 3.5–5.1)

## 2014-08-03 MED ORDER — LEVALBUTEROL HCL 1.25 MG/0.5ML IN NEBU
1.2500 mg | INHALATION_SOLUTION | Freq: Four times a day (QID) | RESPIRATORY_TRACT | Status: DC
Start: 2014-08-03 — End: 2014-08-04
  Administered 2014-08-03: 1.25 mg via RESPIRATORY_TRACT
  Filled 2014-08-03: qty 0.5

## 2014-08-03 MED ORDER — POTASSIUM CHLORIDE CRYS ER 20 MEQ PO TBCR
40.0000 meq | EXTENDED_RELEASE_TABLET | Freq: Once | ORAL | Status: AC
  Administered 2014-08-03: 40 meq via ORAL
  Filled 2014-08-03: qty 2

## 2014-08-03 MED ORDER — SODIUM CHLORIDE 0.9 % IJ SOLN
3.0000 mL | Freq: Two times a day (BID) | INTRAMUSCULAR | Status: DC
  Administered 2014-08-03 – 2014-08-04 (×2): 3 mL via INTRAVENOUS

## 2014-08-03 MED ORDER — POTASSIUM CHLORIDE 10 MEQ/100ML IV SOLN
10.0000 meq | INTRAVENOUS | Status: AC
  Administered 2014-08-03 (×4): 10 meq via INTRAVENOUS
  Filled 2014-08-03 (×4): qty 100

## 2014-08-03 MED ORDER — SODIUM CHLORIDE 0.9 % IJ SOLN
3.0000 mL | INTRAMUSCULAR | Status: DC | PRN
  Administered 2014-08-03 – 2014-08-04 (×5): 3 mL via INTRAVENOUS
  Filled 2014-08-03 (×5): qty 10

## 2014-08-03 MED ORDER — PREDNISONE 50 MG PO TABS
50.0000 mg | ORAL_TABLET | Freq: Every day | ORAL | Status: DC
Start: 2014-08-04 — End: 2014-08-04
  Administered 2014-08-04: 50 mg via ORAL
  Filled 2014-08-03 (×2): qty 1

## 2014-08-03 MED ORDER — POTASSIUM CHLORIDE 10 MEQ/100ML IV SOLN
10.0000 meq | INTRAVENOUS | Status: AC
  Administered 2014-08-03 (×4): 10 meq via INTRAVENOUS
  Filled 2014-08-03 (×4): qty 100

## 2014-08-03 MED ORDER — POTASSIUM CHLORIDE 10 MEQ/100ML IV SOLN
10.0000 meq | INTRAVENOUS | Status: AC
  Administered 2014-08-04 (×2): 10 meq via INTRAVENOUS
  Filled 2014-08-03 (×2): qty 100

## 2014-08-03 NOTE — Plan of Care (Signed)
Problem: Discharge Progression Outcomes Goal: Discharge plan in place and appropriate Individualization  - Likes to be called Russell Keller. - Lives at home with wife. - History of diabetes, hypertension, COPD, and multiple myeloma controlled by home medications. - On high falls precautions per policy, refuses bed alarms, please offer toileting during hourly rounds.    Goal: Other Discharge Outcomes/Goals Outcome: Progressing Plan of care progress to goal for: pneumonia - Continues breathind tx. - Continues solumedrol IV. - Continues ABX. - Morphine PO given for pain with improvement. - IV fluids continued. Will continue to monitor.

## 2014-08-03 NOTE — Progress Notes (Signed)
Reported to Dr Volanda Napoleon potassium 2.6. Ordered 4 - 3meq bags of potassium

## 2014-08-03 NOTE — Progress Notes (Signed)
Inpatient Diabetes Program Recommendations  AACE/ADA: New Consensus Statement on Inpatient Glycemic Control (2013)  Target Ranges:  Prepandial:   less than 140 mg/dL      Peak postprandial:   less than 180 mg/dL (1-2 hours)      Critically ill patients:  140 - 180 mg/dL   Reason for assessment: elevated CBG  Diabetes history: Type 2 Outpatient Diabetes medications: Amaryl 2mg  bid Current orders for Inpatient glycemic control: Amaryl 2mg  bid, Novolog correction 0-9 units tid and hs   Please consider increasing correction scale since the patient is on steroids and post partum blood sugars are elevated;  Novolog resistant correction scale 0-20units tid and Novolog correction 0-5 units qhs  Gentry Fitz, RN, [place], [place], CDE Diabetes Coordinator Inpatient Diabetes Program  [phone] (Team Pager) [phone] ([place]) 08/03/2014 3:08 PM

## 2014-08-03 NOTE — Progress Notes (Signed)
Initial Nutrition Assessment  INTERVENTION:  Meals and Snacks: Cater to patient preferences; will send peaches as 10am and 3pm snack per request as well as strawberry ice cream on dinner trays per preference.    NUTRITION DIAGNOSIS:  Increased nutrient needs related to cancer and cancer related treatments as evidenced by estimated needs.  GOAL:  Patient will meet greater than or equal to 90% of their needs  MONITOR:   (Energy Intake, Electrolyte and renal Profile, Digestive system, Anthropometrics)  REASON FOR ASSESSMENT:  Malnutrition Screening Tool    ASSESSMENT:  Pt admitted with acute respiratory failure secondary to pna and COPD exacerbation PMHx:  Past Medical History  Diagnosis Date  . DVT (deep venous thrombosis)   . Diabetes mellitus without complication   . Hypertension   . COPD (chronic obstructive pulmonary disease)   . Plasmocytoma   . Multiple myeloma    Diet Order: Regular  Current Nutrition: Pt reports eating cream of wheat and fruit this am. Pt reports eating a hamburger and french fries from the community brought in by family last night. Pt reports not tolerating water currently and wife is bringing in the sweet tea he likes.  Food/Nutrition-Related History: Pt reports being very particular about foods eaten PTA. Pt reports eating habits have been regular PTA but that foods taste differently and some foods taste like 'shoe leather' at times for example. Pt reports not liking or tolerating supplement drinks or Magic Cup on previous admission.   Medications: Amaryl, Novolog, revlimid, prednisone, nyastatin  Electrolyte/Renal Profile and Glucose Profile:   Recent Labs Lab 08/02/14 1330 08/03/14 0519  NA 133* 137  K 3.0* 2.6*  CL 88* 95*  CO2 32 33*  BUN 36* 23*  CREATININE 1.55* 1.10  CALCIUM 8.2* 7.8*  GLUCOSE 188* 184*   Protein Profile: No results for input(s): ALBUMIN in the last 168 hours.  Gastrointestinal Profile: Last [facility]  6/22  Nutrition-Focused Physical Exam Findings: Nutrition-Focused physical exam completed. Findings are WDL for fat depletion, muscle depletion, and edema.    Weight Change: Pt reports weight loss of 30lbs in the past 3 months and reports (16% weight loss in 3 months) per [facility] 12% weight loss in 2 months. Anthropometrics: Height:  Ht Readings from Last 1 Encounters:  08/02/14 5' 10"  (1.778 m)   Weight:  Wt Readings from Last 1 Encounters:  08/02/14 150 lb (68.04 kg)    Wt Readings from Last 10 Encounters:  08/02/14 150 lb (68.04 kg)  06/18/14 162 lb 2 oz (73.539 kg)  06/14/14 173 lb 11.6 oz (78.8 kg)  05/15/14 171 lb 11.8 oz (77.9 kg)  06/04/14 171 lb 11.8 oz (77.9 kg)    BMI:  Body mass index is 21.52 kg/(m^2).  Estimated Nutritional Needs:  Kcal:  2063-2439kcals, BEE: 1443kcals, TEE: (IF 1.1-1.3)(AF 1.3)  Protein:  68-82g protein (1.0-1.2g/kg)  Fluid:  1705-2026m of fluid (25-351mkg)  Skin:  Reviewed, no issues  Diet Order:  Diet regular Room service appropriate?: Yes; Fluid consistency:: Thin  EDUCATION NEEDS:  No education needs identified at this time   Intake/Output Summary (Last 24 hours) at 08/03/14 1319 Last data filed at 08/03/14 1208  Gross per 24 hour  Intake   1815 ml  Output    450 ml  Net   1365 ml    MODERATE Care Level  AlDwyane LuoRD, LDN Pager ([phone]

## 2014-08-03 NOTE — Plan of Care (Signed)
Problem: Discharge Progression Outcomes Goal: Discharge plan in place and appropriate Individualization  Outcome: Progressing Likes to be called Rastus. - Lives at home with wife. - History of diabetes, hypertension, COPD, and multiple myeloma controlled by home medications. - On high falls precautions per policy, refuses bed alarms, please offer toileting during hourly rounds.     Goal: Other Discharge Outcomes/Goals Outcome: Progressing Plan of care progress to goal for: pneumonia - Continues breathind tx. - Continues solumedrol IV. - Continues ABX. - Morphine PO given for pain with improvement. -Potassium bolus given. Potassium 2.7

## 2014-08-03 NOTE — Care Management (Signed)
Admitted to [facility] with the diagnosis of pneumonia. Lives with wife, Margarita Grizzle [phone]) Sees Dr. Carolin Coy. Last seen 2 months ago. No home health now, but [facility] in the past.  [facility] in the past x 1 day. Home oxygen in the past, but "called them to come and take all the equipment out of the home." Takes care of all [place] activities of daily living himself. Family will transport. Shelbie Ammons RN MSN Care Management [phone]

## 2014-08-03 NOTE — Progress Notes (Signed)
[place] [facility] [place] NAME: Russell Keller    MR#:  [id number]  DATE OF BIRTH:  September 26, 1945  SUBJECTIVE:  CHIEF COMPLAINT:   Chief Complaint  Patient presents with  . Shortness of Breath   Breathing more comfortably this morning.  Still with oxygen requirement of 2L. K is low this am.  REVIEW OF SYSTEMS:   Review of Systems  Constitutional: Negative for fever.  Respiratory: Positive for cough, sputum production, shortness of breath and wheezing. Negative for hemoptysis.   Cardiovascular: Negative for chest pain and palpitations.  Gastrointestinal: Negative for nausea, vomiting and abdominal pain.  Genitourinary: Negative for dysuria.    DRUG ALLERGIES:  No Known Allergies  VITALS:  Blood pressure 114/66, pulse 62, temperature 97.5 F (36.4 C), temperature source Oral, resp. rate 20, height _0  (1.778 m), weight 68.04 kg (150 lb), SpO2 97 %.  PHYSICAL EXAMINATION:  GENERAL:  69 y.o.-year-old patient lying in the bed with no acute distress. Nasal cannula in place EYES: Pupils equal, round, reactive to light and accommodation. No scleral icterus. Extraocular muscles intact.  HEENT: Head atraumatic, normocephalic. Oropharynx and nasopharynx clear. Mucous membranes moist NECK:  Supple, no jugular venous distention. No thyroid enlargement, no tenderness.  LUNGS: Normal breath sounds bilaterally, no wheezing, rales, rhonchi or crepitation. No use of accessory muscles of respiration.  CARDIOVASCULAR: S1, S2 normal. No murmurs, rubs, or gallops.  ABDOMEN: Soft, nontender, nondistended. Bowel sounds present. No organomegaly or mass.  EXTREMITIES: No pedal edema, cyanosis, or clubbing.  NEUROLOGIC: Cranial nerves II through XII are intact. Muscle strength 5/5 in all extremities. Sensation intact. Gait not checked.  PSYCHIATRIC: The patient is alert and oriented x 3. Anxious SKIN: No obvious rash, lesion, or ulcer.    LABORATORY  PANEL:   CBC  Recent Labs Lab 08/03/14 0519  WBC 3.0*  HGB 9.7*  HCT 28.9*  PLT 125*   ------------------------------------------------------------------------------------------------------------------  Chemistries   Recent Labs Lab 08/03/14 0519  NA 137  K 2.6*  CL 95*  CO2 33*  GLUCOSE 184*  BUN 23*  CREATININE 1.10  CALCIUM 7.8*   ------------------------------------------------------------------------------------------------------------------  Cardiac Enzymes  Recent Labs Lab 08/02/14 1330  TROPONINI 0.03   ------------------------------------------------------------------------------------------------------------------  RADIOLOGY:  Ct Angio Chest Pe W/cm &/or Wo Cm  08/02/2014   CLINICAL DATA:  Shortness of breath.  History of multiple myeloma  EXAM: CT ANGIOGRAPHY CHEST WITH CONTRAST  TECHNIQUE: Multidetector CT imaging of the chest was performed using the standard protocol during bolus administration of intravenous contrast. Multiplanar CT image reconstructions and MIPs were obtained to evaluate the vascular anatomy.  CONTRAST:  23m OMNIPAQUE IOHEXOL 350 MG/ML SOLN  COMPARISON:  Chest radiograph August 02, 2014 ; CT abdomen and pelvis May 17, 2010  FINDINGS: There is no demonstrable pulmonary embolus. There is no thoracic aortic aneurysm or dissection. There is atherosclerotic change in the aorta. There are scattered small foci of coronary artery calcification. The pericardium is not thickened. There is patchy airspace disease in the right lower lobe, felt to represent foci of pneumonia in the right lower lobe. There is mild bibasilar atelectatic change. There is underlying emphysematous change. There is slight lower lobe bronchiectatic change.  Thyroid appears unremarkable.  There is no demonstrable adenopathy.  In the visualized upper abdomen, there is hepatic steatosis. There is a mass arising from the upper pole of the left kidney measuring 2.8 x 2.4 cm which is  solid and is larger compared to prior  abdominal CT from 2012.  There is an endplate fracture along the superior aspect of T12. There are small lytic lesions in the T6, T7, and T9 vertebral bodies, felt to be consistent with known multiple myeloma. There is a lytic lesion involving the post rib medial left scapula measuring 5.1 x 2.8 cm.  Review of the MIP images confirms the above findings.  IMPRESSION: No demonstrable pulmonary embolus.  Underlying emphysema. Patchy pneumonia right lower lobe. Mild bibasilar atelectasis. Mild lower lobe bronchiectatic change.  No adenopathy.  Solid mass arising from the upper pole left kidney measuring 2.8 x 2.4 cm. 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  Lytic lesions in several thoracic vertebral bodies in the right scapula consistent with multiple myeloma.  These results were called by telephone at the time of interpretation on 08/02/2014 at 4:21 pm to Dr. Clearnce Hasten, ED phyiscian, who verbally acknowledged these results.   Electronically Signed   By: Lowella Grip III M.D.   On: 08/02/2014 16:25   Dg Chest Port 1 View  08/02/2014   CLINICAL DATA:  Initial encounter for shortness of breath and syncope today.  EXAM: PORTABLE CHEST - 1 VIEW  COMPARISON:  06/17/2014.  FINDINGS: 1337 hrs. Lungs are hyperexpanded without edema or focal airspace consolidation. No pleural effusion. The cardiopericardial silhouette is within normal limits for size. Imaged bony structures of the thorax are intact. Telemetry leads overlie the chest.  IMPRESSION: Emphysema without acute cardiopulmonary findings.   Electronically Signed   By: Misty Stanley M.D.   On: 08/02/2014 13:49    EKG:   Orders placed or performed during the hospital encounter of 08/02/14  . ED EKG  . ED EKG  . EKG 12-Lead  . EKG 12-Lead    ASSESSMENT AND PLAN:   Principal Problem:   Acute respiratory failure Active Problems:   Multiple myeloma   Uncomplicated type 2 diabetes mellitus   Essential hypertension   Current  smoker   Chronic obstructive pulmonary emphysema   PNA (pneumonia)  #1 acute respiratory failure versus acute on chronic respiratory failure: Patient has advanced COPD. States that he does not use oxygen at home, though he has in the past. Now with community-acquired pneumonia. No cultures obtained on admission. Still requiring 2 L via nasal cannula. We'll continue to treat for pneumonia and COPD exacerbation. Will need ambulatory O2 sats prior to discharge to evaluate for home oxygen need  #2 community-acquired pneumonia: No culture data obtained on admission. Continue with Levaquin. Afebrile. White count is low. Respiratory status improving.  #3 COPD: Current smoker. States that he does not use oxygen at home possibly due to payment concerns. Continue with steroid, taper to oral steroid. Continue with nebulizer treatments  #4 renal mass with lytic lesions of the spine, history of multiple myeloma: Patient would like to discuss this with his oncologist Dr. Grayland Ormond. States that he was not aware of a renal mass. Oncology consultation pending.  #5 hypokalemia: Replace, check magnesium. Recheck BMP this afternoon  #6 diabetes mellitus type 2: Blood sugars elevated due to steriods. Continue sliding scale insulin.  #7 hypertension: Controlled. Not on any antihypertensives at home  #8. Thrombocytopenia, anemia, neutropenia: Due to multiple myeloma   All the records are reviewed and case discussed with Care Management/Social Workerr. Management plans discussed with the patient, family and they are in agreement.  CODE STATUS: full  TOTAL TIME TAKING CARE OF THIS PATIENT: 35 minutes.   POSSIBLE D/C IN 1-2  DAYS, DEPENDING ON CLINICAL CONDITION. Will need to  assess for home 02 need in am.   Myrtis Ser M.D on 08/03/2014 at 12:08 PM  Between 7am to 6pm - Pager - [phone]  After 6pm go to [email or web address] - password [email or web address] [place] [facility]  Office   [phone]  CC: Primary care physician; Halina Maidens, MD

## 2014-08-03 NOTE — Progress Notes (Signed)
Spoke to Dr Patsy Baltimore, patient needs M-spike blood work

## 2014-08-03 NOTE — Progress Notes (Signed)
Dr. Diamond notified of critical potassium level of 2.9. MD to put in orders. 

## 2014-08-03 NOTE — Progress Notes (Signed)
Reported to Dr Volanda Napoleon potassium 2.7 Ordered to check magnesium level stat, 4 bags 91meq potassium IV, repeat potassium level tonight at 9:00pm

## 2014-08-04 DIAGNOSIS — N2889 Other specified disorders of kidney and ureter: Secondary | ICD-10-CM

## 2014-08-04 LAB — CBC
HEMATOCRIT: 30.8 % — AB (ref 40.0–52.0)
HEMOGLOBIN: 10.3 g/dL — AB (ref 13.0–18.0)
MCH: 32.7 pg (ref 26.0–34.0)
MCHC: 33.4 g/dL (ref 32.0–36.0)
MCV: 97.8 fL (ref 80.0–100.0)
Platelets: 133 10*3/uL — ABNORMAL LOW (ref 150–440)
RBC: 3.15 MIL/uL — ABNORMAL LOW (ref 4.40–5.90)
RDW: 16.7 % — AB (ref 11.5–14.5)
WBC: 4.1 10*3/uL (ref 3.8–10.6)

## 2014-08-04 LAB — BASIC METABOLIC PANEL
ANION GAP: 6 (ref 5–15)
BUN: 18 mg/dL (ref 6–20)
CALCIUM: 8.1 mg/dL — AB (ref 8.9–10.3)
CO2: 34 mmol/L — AB (ref 22–32)
Chloride: 98 mmol/L — ABNORMAL LOW (ref 101–111)
Creatinine, Ser: 0.81 mg/dL (ref 0.61–1.24)
GFR calc Af Amer: >60 mL/min (ref >60)
GFR calc non Af Amer: >60 mL/min (ref >60)
Glucose, Bld: 58 mg/dL — ABNORMAL LOW (ref 65–99)
POTASSIUM: 3.4 mmol/L — AB (ref 3.5–5.1)
Sodium: 138 mmol/L (ref 135–145)

## 2014-08-04 LAB — GLUCOSE, CAPILLARY
Glucose-Capillary: 61 mg/dL — ABNORMAL LOW (ref 65–99)
Glucose-Capillary: 97 mg/dL (ref 65–99)
Glucose-Capillary: 177 mg/dL — ABNORMAL HIGH (ref 65–99)

## 2014-08-04 MED ORDER — BACLOFEN 10 MG PO TABS: 10.0000 mg | ORAL_TABLET | Freq: Three times a day (TID) | ORAL | Status: DC

## 2014-08-04 MED ORDER — LEVOFLOXACIN 500 MG PO TABS: 500.0000 mg | ORAL_TABLET | Freq: Every day | ORAL | Status: DC

## 2014-08-04 MED ORDER — IBUPROFEN 400 MG PO TABS: 800.0000 mg | ORAL_TABLET | Freq: Three times a day (TID) | ORAL | Status: DC

## 2014-08-04 MED ORDER — LEVALBUTEROL HCL 1.25 MG/0.5ML IN NEBU
1.2500 mg | INHALATION_SOLUTION | Freq: Three times a day (TID) | RESPIRATORY_TRACT | Status: DC
  Administered 2014-08-04 (×2): 1.25 mg via RESPIRATORY_TRACT
  Filled 2014-08-04 (×2): qty 0.5

## 2014-08-04 MED ORDER — PREDNISONE 10 MG (21) PO TBPK: 10.0000 mg | ORAL_TABLET | Freq: Every day | ORAL | Status: DC

## 2014-08-04 MED ORDER — IPRATROPIUM-ALBUTEROL 0.5-2.5 (3) MG/3ML IN SOLN: 3.0000 mL | Freq: Four times a day (QID) | RESPIRATORY_TRACT | Status: AC | PRN

## 2014-08-04 NOTE — Progress Notes (Addendum)
Patient BS is 61 refused orange juice said would drink a little soda and eat and we will recheck BS, we rechecked BS was 97 after patient ate

## 2014-08-04 NOTE — Care Management (Signed)
Physical therapy evaluation completed. Recommends home with home health/physical therapy. Spoke with Mr. Bentler at the bedside. States that he would like a agency that takes his Medicare [employer] for his [place] services and home oxygen.  Telephone call to Will Anderson,durable medical equipment representative. Will run Mr. Zaremba insurance and let him know if there is a co-pay. Also will bring a standard walker. Telephone call to [place] at [facility]. Will also run Mr. Sessums insurance and let him know if there is a co-pay. 88% at rest on room air 08/04/14 12:45pm Shelbie Ammons RN MSN Care Management [phone]

## 2014-08-04 NOTE — Evaluation (Signed)
Physical Therapy Evaluation Patient Details Name: Russell Keller MRN: [id number] DOB: 04-Sep-1945 Today's Date: 08/04/2014   History of Present Illness  presented to ER secondary to progressive SOB x2 weeks; admitted with acute on chronic respiratory failure related to COPD/PNA. Also of note, patient with newly noted kidney mass suspicious for renal cell carcinoma.  Clinical Impression  Upon evaluation, patient alert and oriented, follows all commands; partially receptive to therapist recommendations/suggestions.  Bilat UE/LE strength and ROM grossly WFL and symmetrical (not tested with significant resistance due to multiple known lytic lesions), appropriate for basic transfers and mobility; however, generally weakened and deconditioned.  Able to complete bed mobility indep; sit/stand and basic transfers with close sup; gait (x110') without assist device, cga.  Patient generally unsteady, constantly reaching for walls/rails for external stabilization, but refuses trial of assist device at this time.  Reviewed benefit of SPC vs. RW for stabilization and for energy conservation; agreeable to take one home upon discharge (for use on "bad days", but refuses use in public). Patient on 2L supplemental O2 throughout session.  Desat to 87% with minimal exertion on 2L, requiring increase to 3L with gait efforts.  On 3L, able to maintain 91-92% with exertion.  Returned to 2L end of session with sats >91%.  Anticipate need for home O2 upon discharge. Would benefit from skilled PT to address above deficits and promote optimal return to PLOF; Recommend transition to [place] upon discharge from acute hospitalization.     Follow Up Recommendations Home health PT    Equipment Recommendations  Rolling walker with 5" wheels    Recommendations for Other Services       Precautions / Restrictions Precautions Precautions: Fall Restrictions Weight Bearing Restrictions: No      Mobility  Bed Mobility                   Transfers Overall transfer level: Needs assistance Equipment used: None Transfers: Sit to/from Stand Sit to Stand: Supervision;Min guard         General transfer comment: requires UE support to complete  Ambulation/Gait Ambulation/Gait assistance: Min guard Ambulation Distance (Feet): 110 Feet Assistive device: None       General Gait Details: reciprocal stepping with fair step height/length; decreased weight shift/stance time to R LE.  Generally unsteady; constantly reaching/grabbing for walls/rails for external stabilization.  Refuses attempt at RW vs. SPC despite encouragement from therapist  Stairs            Wheelchair Mobility    Modified Rankin (Stroke Patients Only)       Balance Overall balance assessment: Needs assistance Sitting-balance support: No upper extremity supported;Feet supported Sitting balance-Leahy Scale: Good     Standing balance support: Single extremity supported Standing balance-Leahy Scale: Fair                               Pertinent Vitals/Pain Pain Assessment: No/denies pain    Home Living Family/patient expects to be discharged to:: Private residence Living Arrangements: Spouse/significant other Available Help at Discharge: Family Type of Home: House Home Access: Stairs to enter   [employer] of Steps: 3 with rail Home Layout: One level Home Equipment: None      Prior Function Level of Independence: Independent         Comments: Indep for household mobility, "furniture cruises" within home environment.  Reports limited community mobilization over recent weeks due to respiratory status/decline.  Does endorse  single fall in recent weeks ("due to staying up too long after I took my medicine")     Hand Dominance        Extremity/Trunk Assessment   Upper Extremity Assessment: Overall WFL for tasks assessed           Lower Extremity Assessment: Overall WFL for tasks  assessed         Communication   Communication: No difficulties  Cognition Arousal/Alertness: Awake/alert Behavior During Therapy: WFL for tasks assessed/performed Overall Cognitive Status: Within Functional Limits for tasks assessed                      General Comments      Exercises Other Exercises Other Exercises: Discussed benefit of SPC vs RW for higher-level balance and for energy conservation; patient agreeable to consider.  Will attempt trial in subsequent sessions.      Assessment/Plan    PT Assessment Patient needs continued PT services  PT Diagnosis Difficulty walking;Generalized weakness   PT Problem List Decreased strength;Decreased activity tolerance;Decreased balance;Decreased mobility;Decreased knowledge of use of DME;Cardiopulmonary status limiting activity  PT Treatment Interventions DME instruction;Gait training;Stair training;Functional mobility training;Therapeutic activities;Therapeutic exercise;Patient/family education;Balance training   PT Goals (Current goals can be found in the Care Plan section) Acute Rehab PT Goals Patient Stated Goal: "to go back home" PT Goal Formulation: With patient Time For Goal Achievement: 08/18/14 Potential to Achieve Goals: Good Additional Goals Additional Goal #1: Indep understanding/integration of energy conservation and activity pacing techniques to maximize oxygenation with functional activity    Frequency Min 2X/week   Barriers to discharge        Co-evaluation               End of Session Equipment Utilized During Treatment: Gait belt Activity Tolerance: Patient tolerated treatment well Patient left: in bed;with call bell/phone within reach (refusing bed alarm; RN informed/aware) Nurse Communication: Mobility status (O2 response to activity)         Time: 4801-6553 PT Time Calculation (min) (ACUTE ONLY): 22 min   Charges:   PT Evaluation $Initial PT Evaluation Tier I: 1 Procedure      PT G Codes:        Triton Heidrich H. Owens Shark, PT, DPT, NCS 08/04/2014, 1:27 PM [phone]

## 2014-08-04 NOTE — Progress Notes (Signed)
[facility]  Date of admission:  08/02/2014  Inpatient day:  08/03/2014  Consulting physician: Dustin Flock, MD   Reason for consultation:  Left kidney mass  Chief Complaint: Russell Keller is a 69 y.o. male with multiple myeloma who was admitted through the emergency room with an acute COPD exacerbation as well as pneumonia.  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  He states that he has been treated adequately with Revlimid secondary to an increasing spica.  He received radiation to the lumbar spine and SI joints for myelomatous involvement in 2011. In 03/2014 he presented with difficulty ambulating and right hip pain. MRI revealed progressive changes in the right hemipelvis consistent with a myelomatous involvement outside the bone and probable pathologic fracture of the inferior pubic ramus. He received palliative radiation to the right hip including the inferior pubic ramus from 03/29/2014 until 04/11/2014.  He was last seen in medical oncology clinic by Dr. Delight Hoh on 06/16/2014. At that time, his hip pain was significantly better. He was on low-dose maintenance Revlimid at 10 mg a day. Monoclonal spike was trending down and was 1.1 gm/dL.  The patient presented with shortness of breath, dry cough and low oxygen saturations up. He states that he had a syncopal event in his situation. He presented to the emergency room.  Chest CT angiogram revealed no pulmonary embolism. There was patchy right lower lobe pneumonia. Of note was a 2.8 x 2.4 cm solid mass arising from the upper pole of the left kidney suspicious for renal cell carcinoma.  There were lytic lesions in several thoracic vertebral bodies and in the right scapula consistent with multiple myeloma. The kidney lesion was notably solid and larger compared to the abdominal CT  from 2012.  Past Medical History  Diagnosis Date  . DVT (deep venous thrombosis)   . Diabetes mellitus without complication   . Hypertension   . COPD (chronic obstructive pulmonary disease)   . Plasmocytoma   . Multiple myeloma     Past Surgical History  Procedure Laterality Date  . Bone marrow transplant      Multiple Myleoma  . Tonsillectomy    . Colon, bladder fistula repair  2012    with colostomy  . Multiple myeloma    . Cataract Right   . Colostomy takedown  2012    Family History  Problem Relation Age of Onset  . Diabetes Mother   . Cancer Mother     Social History:  reports that he has been smoking Cigarettes.  He has a 25 pack-year smoking history. He has never used smokeless tobacco. He reports that he does not drink alcohol or use illicit drugs.  The patient is accompanied by his wife today.  Allergies: No Known Allergies  Medications Prior to Admission  Medication Sig Dispense Refill  . baclofen (LIORESAL) 10 MG tablet Take 10 mg by mouth 3 (three) times daily as needed for muscle spasms.    Marland Kitchen dexamethasone (DECADRON) 4 MG tablet Take 20 mg by mouth every 7 (seven) days. Pt takes on Wednesday.    . diazepam (VALIUM) 2 MG tablet Take 1 tablet (2 mg total) by mouth at bedtime. 30 tablet 0  . fluticasone (FLONASE) 50 MCG/ACT nasal spray Place 2 sprays into both nostrils daily as needed for rhinitis.    Marland Kitchen gabapentin (NEURONTIN) 300 MG capsule Take 300 mg  by mouth 2 (two) times daily.    Marland Kitchen glimepiride (AMARYL) 2 MG tablet Take 2 mg by mouth 2 (two) times daily.     Marland Kitchen ibuprofen (ADVIL,MOTRIN) 800 MG tablet Take 800 mg by mouth every 8 (eight) hours as needed for moderate pain.     Marland Kitchen lenalidomide (REVLIMID) 10 MG capsule Take 10 mg by mouth daily.    Marland Kitchen levalbuterol (XOPENEX HFA) 45 MCG/ACT inhaler Inhale 2 puffs into the lungs every 4 (four) hours as needed for wheezing or shortness of breath.     . Melatonin 5 MG TABS Take 1 tablet by mouth at bedtime as needed (for  sleep).    . morphine (MS CONTIN) 15 MG 12 hr tablet Take 1 tablet (15 mg total) by mouth every 8 (eight) hours. 90 tablet 0  . nystatin (MYCOSTATIN) 100000 UNIT/ML suspension Take 10 mLs (1,000,000 Units total) by mouth 4 (four) times daily. 473 mL 2  . omeprazole (PRILOSEC) 20 MG capsule Take 20 mg by mouth daily as needed (for indigestion/heartburn).     . prednisoLONE acetate (PRED FORTE) 1 % ophthalmic suspension Place 1 drop into the right eye 2 (two) times daily.    . prochlorperazine (COMPAZINE) 10 MG tablet Take 10 mg by mouth every 6 (six) hours as needed for nausea or vomiting.    . valACYclovir (VALTREX) 500 MG tablet Take 500 mg by mouth at bedtime.      Review of Systems: GENERAL:  Feels good.  No fevers or sweats.  Weight loss of 30# in the past 2 months. PERFORMANCE STATUS (ECOG):  1 HEENT:  Thrush.  No visual changes or runny nose. Lungs: Shortness of breath.  Cough.  No hemoptysis. Cardiac:  No chest pain, palpitations, orthopnea, or PND. GI:  Poor appetite.  Nothing tastes good (blames Revlimid).  No nausea, vomiting, diarrhea, constipation, melena or hematochezia. GU:  No urgency, frequency, dysuria, or hematuria. Musculoskeletal:  No back pain.  No joint pain.  No muscle tenderness. Extremities:  No pain or swelling. Skin:  No rashes or skin changes. Neuro:  No headache, numbness or weakness, balance or coordination issues. Endocrine:  No diabetes, thyroid issues, hot flashes or night sweats. Psych:  No mood changes, depression or anxiety. Pain:  No focal pain. Review of systems:  All other systems reviewed and found to be negative.   Physical Exam:  Blood pressure 112/49, pulse 85, temperature 97.9 F (36.6 C), temperature source Oral, resp. rate 18, height _0  (1.778 m), weight 150 lb (68.04 kg), SpO2 96 %.  GENERAL:  Well developed, well nourished, sitting comfortably on the medical unit in no acute distress. MENTAL STATUS:  Alert and oriented to person, place  and time. HEAD:  Normocephalic, atraumatic, face symmetric, no Cushingoid features. EYES:  Pupils equal round and reactive to light and accomodation.  No conjunctivitis or scleral icterus. ENT:  Thrush.  Tongue normal. Mucous membranes moist.  RESPIRATORY:  Decreased respiratory excursion.  Clear to auscultation without rales, wheezes or rhonchi. CARDIOVASCULAR:  Regular rate and rhythm without murmur, rub or gallop. ABDOMEN:  Soft, non-tender, with active bowel sounds, and no hepatosplenomegaly.  No masses. SKIN:  No rashes, ulcers or lesions. EXTREMITIES: No edema, no skin discoloration or tenderness.  No palpable cords. LYMPH NODES: No palpable cervical, supraclavicular, axillary or inguinal adenopathy  NEUROLOGICAL: Unremarkable. PSYCH:  Appropriate.   Results for orders placed or performed during the hospital encounter of 08/02/14 (from the past 48 hour(s))  Basic metabolic panel  Status: Abnormal   Collection Time: 08/02/14  1:30 PM  Result Value Ref Range   Sodium 133 (L) 135 - 145 mmol/L   Potassium 3.0 (L) 3.5 - 5.1 mmol/L   Chloride 88 (L) 101 - 111 mmol/L   CO2 32 22 - 32 mmol/L   Glucose, Bld 188 (H) 65 - 99 mg/dL   BUN 36 (H) 6 - 20 mg/dL   Creatinine, Ser 1.55 (H) 0.61 - 1.24 mg/dL   Calcium 8.2 (L) 8.9 - 10.3 mg/dL   GFR calc non Af Amer 44 (L) >60 mL/min   GFR calc Af Amer 51 (L) >60 mL/min    Comment: (NOTE) The eGFR has been calculated using the CKD EPI equation. This calculation has not been validated in all clinical situations. eGFR's persistently <60 mL/min signify possible Chronic Kidney Disease.    Anion gap 13 5 - 15  CBC     Status: Abnormal   Collection Time: 08/02/14  1:30 PM  Result Value Ref Range   WBC 4.6 3.8 - 10.6 K/uL   RBC 3.46 (L) 4.40 - 5.90 MIL/uL   Hemoglobin 11.3 (L) 13.0 - 18.0 g/dL   HCT 33.8 (L) 40.0 - 52.0 %   MCV 97.5 80.0 - 100.0 fL   MCH 32.7 26.0 - 34.0 pg   MCHC 33.5 32.0 - 36.0 g/dL   RDW 16.2 (H) 11.5 - 14.5 %    Platelets 139 (L) 150 - 440 K/uL  Troponin I     Status: None   Collection Time: 08/02/14  1:30 PM  Result Value Ref Range   Troponin I 0.03 <0.031 ng/mL    Comment:        NO INDICATION OF MYOCARDIAL INJURY.   Glucose, capillary     Status: Abnormal   Collection Time: 08/02/14 10:57 PM  Result Value Ref Range   Glucose-Capillary 221 (H) 65 - 99 mg/dL  CBC     Status: Abnormal   Collection Time: 08/03/14  5:19 AM  Result Value Ref Range   WBC 3.0 (L) 3.8 - 10.6 K/uL   RBC 2.96 (L) 4.40 - 5.90 MIL/uL   Hemoglobin 9.7 (L) 13.0 - 18.0 g/dL   HCT 28.9 (L) 40.0 - 52.0 %   MCV 97.4 80.0 - 100.0 fL   MCH 32.8 26.0 - 34.0 pg   MCHC 33.7 32.0 - 36.0 g/dL   RDW 16.1 (H) 11.5 - 14.5 %   Platelets 125 (L) 150 - 440 K/uL  Basic metabolic panel     Status: Abnormal   Collection Time: 08/03/14  5:19 AM  Result Value Ref Range   Sodium 137 135 - 145 mmol/L   Potassium 2.6 (LL) 3.5 - 5.1 mmol/L    Comment: RESULTS VERIFIED BY REPEAT TESTING CRITICAL RESULT CALLED TO, READ BACK BY AND VERIFIED WITH TRACY WATSON @ 0720 08/03/14 BGB    Chloride 95 (L) 101 - 111 mmol/L   CO2 33 (H) 22 - 32 mmol/L   Glucose, Bld 184 (H) 65 - 99 mg/dL   BUN 23 (H) 6 - 20 mg/dL   Creatinine, Ser 1.10 0.61 - 1.24 mg/dL   Calcium 7.8 (L) 8.9 - 10.3 mg/dL   GFR calc non Af Amer >60 >60 mL/min   GFR calc Af Amer >60 >60 mL/min    Comment: (NOTE) The eGFR has been calculated using the CKD EPI equation. This calculation has not been validated in all clinical situations. eGFR's persistently <60 mL/min signify possible Chronic Kidney Disease.  Anion gap 9 5 - 15  Magnesium     Status: None   Collection Time: 08/03/14  5:19 AM  Result Value Ref Range   Magnesium 2.0 1.7 - 2.4 mg/dL  Glucose, capillary     Status: Abnormal   Collection Time: 08/03/14  9:14 AM  Result Value Ref Range   Glucose-Capillary 255 (H) 65 - 99 mg/dL  Glucose, capillary     Status: Abnormal   Collection Time: 08/03/14 11:09 AM   Result Value Ref Range   Glucose-Capillary 234 (H) 65 - 99 mg/dL  Basic metabolic panel     Status: Abnormal   Collection Time: 08/03/14  2:27 PM  Result Value Ref Range   Sodium 137 135 - 145 mmol/L   Potassium 2.7 (LL) 3.5 - 5.1 mmol/L    Comment: CRITICAL RESULT CALLED TO, READ BACK BY AND VERIFIED WITH DIEDRE MALCOLM AT 1513 08/03/14 BOD    Chloride 94 (L) 101 - 111 mmol/L   CO2 31 22 - 32 mmol/L   Glucose, Bld 181 (H) 65 - 99 mg/dL   BUN 21 (H) 6 - 20 mg/dL   Creatinine, Ser 1.06 0.61 - 1.24 mg/dL   Calcium 7.8 (L) 8.9 - 10.3 mg/dL   GFR calc non Af Amer >60 >60 mL/min   GFR calc Af Amer >60 >60 mL/min    Comment: (NOTE) The eGFR has been calculated using the CKD EPI equation. This calculation has not been validated in all clinical situations. eGFR's persistently <60 mL/min signify possible Chronic Kidney Disease.    Anion gap 12 5 - 15  Glucose, capillary     Status: Abnormal   Collection Time: 08/03/14  4:12 PM  Result Value Ref Range   Glucose-Capillary 164 (H) 65 - 99 mg/dL  Glucose, capillary     Status: Abnormal   Collection Time: 08/03/14  9:24 PM  Result Value Ref Range   Glucose-Capillary 199 (H) 65 - 99 mg/dL   Comment 1 Notify RN   Potassium     Status: Abnormal   Collection Time: 08/03/14 10:46 PM  Result Value Ref Range   Potassium 2.9 (LL) 3.5 - 5.1 mmol/L    Comment: CRITICAL RESULT CALLED TO, READ BACK BY AND VERIFIED WITH PHYLLIS KING @ 2317 6.23.16 MPG    Ct Angio Chest Pe W/cm &/or Wo Cm  08/02/2014   CLINICAL DATA:  Shortness of breath.  History of multiple myeloma  EXAM: CT ANGIOGRAPHY CHEST WITH CONTRAST  TECHNIQUE: Multidetector CT imaging of the chest was performed using the standard protocol during bolus administration of intravenous contrast. Multiplanar CT image reconstructions and MIPs were obtained to evaluate the vascular anatomy.  CONTRAST:  37m OMNIPAQUE IOHEXOL 350 MG/ML SOLN  COMPARISON:  Chest radiograph August 02, 2014 ; CT abdomen  and pelvis May 17, 2010  FINDINGS: There is no demonstrable pulmonary embolus. There is no thoracic aortic aneurysm or dissection. There is atherosclerotic change in the aorta. There are scattered small foci of coronary artery calcification. The pericardium is not thickened. There is patchy airspace disease in the right lower lobe, felt to represent foci of pneumonia in the right lower lobe. There is mild bibasilar atelectatic change. There is underlying emphysematous change. There is slight lower lobe bronchiectatic change.  Thyroid appears unremarkable.  There is no demonstrable adenopathy.  In the visualized upper abdomen, there is hepatic steatosis. There is a mass arising from the upper pole of the left kidney measuring 2.8 x 2.4 cm which is  solid and is larger compared to prior abdominal CT from 2012.  There is an endplate fracture along the superior aspect of T12. There are small lytic lesions in the T6, T7, and T9 vertebral bodies, felt to be consistent with known multiple myeloma. There is a lytic lesion involving the post rib medial left scapula measuring 5.1 x 2.8 cm.  Review of the MIP images confirms the above findings.  IMPRESSION: No demonstrable pulmonary embolus.  Underlying emphysema. Patchy pneumonia right lower lobe. Mild bibasilar atelectasis. Mild lower lobe bronchiectatic change.  No adenopathy.  Solid mass arising from the upper pole left kidney measuring 2.8 x 2.4 cm. 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  Lytic lesions in several thoracic vertebral bodies in the right scapula consistent with multiple myeloma.  These results were called by telephone at the time of interpretation on 08/02/2014 at 4:21 pm to Dr. Clearnce Hasten, ED phyiscian, who verbally acknowledged these results.   Electronically Signed   By: Lowella Grip III M.D.   On: 08/02/2014 16:25   Dg Chest Port 1 View  08/02/2014   CLINICAL DATA:  Initial encounter for shortness of breath and syncope today.  EXAM: PORTABLE CHEST - 1 VIEW   COMPARISON:  06/17/2014.  FINDINGS: 1337 hrs. Lungs are hyperexpanded without edema or focal airspace consolidation. No pleural effusion. The cardiopericardial silhouette is within normal limits for size. Imaged bony structures of the thorax are intact. Telemetry leads overlie the chest.  IMPRESSION: Emphysema without acute cardiopulmonary findings.   Electronically Signed   By: Misty Stanley M.D.   On: 08/02/2014 13:49    Assessment:  The patient is a 69 y.o.  gentleman with stage III multiple myeloma status post autologous stem cell transplant (05/2004) currently on maintenance Revlimid. He completed a course of palliative radiation to right hip and inferior pubic ramus  for myelomatous involvement on 04/11/2014.  He was admitted with right lower lobe pneumonia and COPD flare.  Incidental findings on chest CT angiogram revealed a 2.8 x 2.4 cm solid mass arising from the upper pole of the left kidney suspicious for renal cell carcinoma.  The lesion was larger compared to the abdominal CT from 2012.  Plan:   1)  Discuss with patient imaging studies and concern for renal cell carcinoma.  Review with urology regarding need for additional imaging (abdominal MRI).  Discussed with patient typical management of localized renal cell carcinoma (nephrectomy). Currently appears stage I (small T1 lesion; lytic lesions likely due to advanced myeloma).  By history, myeloma well controlled, although recent need for radiation and significant unexplained weight loss.  Other treatment options for renal cell discussed including ablation (cryotherapy or radiofrequency ablation).  Discussed presentation at tumor board next week. 2)  Consult urology. 3)  Continue Revlimid. 4)  SPEP with AM labs on 08/04/2014..   Thank you for allowing me to participate in Russell Keller 's care.  I will follow him closely with you while hospitalized.  Lequita Asal, MD

## 2014-08-04 NOTE — Progress Notes (Signed)
Received MD order to discharge patient to home with home health, and o2, discharge instructions reviewed with patient and spouse, discharged with nursing and o2 to home with spouse in car

## 2014-08-04 NOTE — Discharge Instructions (Signed)
Your blood sugars were low in the hospital. Please hold Amaryl until blood sugars are consistently higher.   DIET:  Cardiac diet  DISCHARGE CONDITION:  Fair  ACTIVITY:  Activity as tolerated  OXYGEN:  Home Oxygen: Yes.     Oxygen Delivery: 2 liters/min via nasal canula  DISCHARGE LOCATION:  home   If you experience worsening of your admission symptoms, develop shortness of breath, life threatening emergency, suicidal or homicidal thoughts you must seek medical attention immediately by calling 911 or calling your MD immediately  if symptoms less severe.  You Must read complete instructions/literature along with all the possible adverse reactions/side effects for all the Medicines you take and that have been prescribed to you. Take any new Medicines after you have completely understood and accpet all the possible adverse reactions/side effects.   Please note  You were cared for by a hospitalist during your hospital stay. If you have any questions about your discharge medications or the care you received while you were in the hospital after you are discharged, you can call the unit and asked to speak with the hospitalist on call if the hospitalist that took care of you is not available. Once you are discharged, your primary care physician will handle any further medical issues. Please note that NO REFILLS for any discharge medications will be authorized once you are discharged, as it is imperative that you return to your primary care physician (or establish a relationship with a primary care physician if you do not have one) for your aftercare needs so that they can reassess your need for medications and monitor your lab values.

## 2014-08-04 NOTE — Plan of Care (Signed)
Problem: Discharge Progression Outcomes Goal: Discharge plan in place and appropriate Individualization  Likes to be called Russell Keller. - Lives at home with wife. - History of diabetes, hypertension, COPD, and multiple myeloma controlled by home medications. - On high falls precautions per policy, refuses bed alarms, please offer toileting during hourly rounds.      Goal: Other Discharge Outcomes/Goals Outcome: Progressing Chronic R hip and back pain managed with scheduled morphine. Dyspnea on exertion, continue 2L O2 per [place] with O2 sats in the mid 90's. Continue nebs, ABX. BP/HR stable. Afebrile. Potassium level 2.9, MD notified, potassium bolus and PO given. BMP pending.

## 2014-08-04 NOTE — Progress Notes (Signed)
Inpatient Diabetes Program Recommendations  AACE/ADA: New Consensus Statement on Inpatient Glycemic Control (2013)  Target Ranges:  Prepandial:   less than 140 mg/dL      Peak postprandial:   less than 180 mg/dL (1-2 hours)      Critically ill patients:  140 - 180 mg/dL    Results for JOVONTA, LEVIT (MRN [id number]) as of 08/04/2014 09:10  Ref. Range 08/03/2014 09:14 08/03/2014 11:09 08/03/2014 16:12 08/03/2014 21:24  Glucose-Capillary Latest Ref Range: 65-99 mg/dL 255 (H) 234 (H) 164 (H) 199 (H)    Results for ACEY, WOODFIELD (MRN [id number]) as of 08/04/2014 09:10  Ref. Range 08/04/2014 07:37 08/04/2014 08:41  Glucose-Capillary Latest Ref Range: 65-99 mg/dL 61 (L) 97     Admit with: Pneumonia/ COPD  History: DM, HTN, Multiple Myeloma  Home DM Meds: Amaryl 2 mg bid  Current DM Orders: Amaryl 2 mg bid            Novolog Sensitive SSI tid ac + HS    **Note patient received last dose of IV Solumedrol yesterday at 10AM.  Now getting Prednisone 50 mg daily.  **Eating food brought in from outside of hospital by family.  **Hypoglycemic this AM- CBG was 61 mg/dl.  Treated with soda and food by RN per nursing note.  Recheck CBG- 97 mg/dl.    MD- Note sure how adequate patient's PO intake is at this time.  If patient continues to have Hypoglycemic events, please consider discontinuation of Amaryl and manage with Novolog SSI alone until patient discharges home    Will follow Wyn Quaker RN, MSN, CDE Diabetes Coordinator Inpatient Glycemic Control Team Team Pager: [phone] (8a-5p)

## 2014-08-06 NOTE — Discharge Summary (Signed)
[facility]   PATIENT NAME: Russell Keller    MR#:  [id number]  DATE OF BIRTH:  05/26/1945  DATE OF ADMISSION:  08/02/2014 ADMITTING PHYSICIAN: Dustin Flock, MD  DATE OF DISCHARGE: 08/04/2014  3:32 PM  PRIMARY CARE PHYSICIAN: Halina Maidens, MD    ADMISSION DIAGNOSIS:  Acute respiratory failure with hypoxia [J96.01] Acute exacerbation of emphysema [J43.9]  DISCHARGE DIAGNOSIS:  Principal Problem:   Acute respiratory failure Active Problems:   Multiple myeloma   Uncomplicated type 2 diabetes mellitus   Essential hypertension   Current smoker   Chronic obstructive pulmonary emphysema   PNA (pneumonia)   Left kidney mass   SECONDARY DIAGNOSIS:   Past Medical History  Diagnosis Date  . DVT (deep venous thrombosis)   . Diabetes mellitus without complication   . Hypertension   . COPD (chronic obstructive pulmonary disease)   . Plasmocytoma   . Multiple myeloma     HOSPITAL COURSE:   #1 acute respiratory failure versus acute on chronic respiratory failure: Patient has advanced COPD. States that he does not use oxygen at home, though he has in the past but then asked the company to take back the tank because he did not want to pay a copay. Now with community-acquired pneumonia and decreased 02 sats. Qualifies for home 02 and this has been arranged by home health.  #2 community-acquired pneumonia: No culture data obtained on admission. Continue with Levaquin. Afebrile. White count is low. Respiratory status improving, but still with supplemental 02 need.  #3 COPD: Current smoker. States that he does not use oxygen at home possibly due to payment concerns. Continue with oral steroid taper. Continue with nebulizer treatments.  #4 renal mass with lytic lesions of the spine, history of multiple myeloma: Seen by Dr. Mike Gip, oncology during this admission. Concern for renal cell carcinoma.  No inpatient  urologist available for consultation, so outpatient appointment has been made.  #5 hypokalemia: Replacee, magnesium normal  #6 diabetes mellitus type 2: Blood sugars elevated due to steriods. Will improve with taper.  #7 hypertension: Controlled. Not on any antihypertensives at home  #8. Thrombocytopenia, anemia, neutropenia: Due to multiple myeloma   DISCHARGE CONDITIONS:   fair  CONSULTS OBTAINED:  Treatment Team:  Dustin Flock, MD Lequita Asal, MD Cleon Gustin, MD  DRUG ALLERGIES:  No Known Allergies  DISCHARGE MEDICATIONS:   Discharge Medication List as of 08/04/2014  3:10 PM    START taking these medications   Details  ipratropium-albuterol (DUONEB) 0.5-2.5 (3) MG/3ML SOLN Take 3 mLs by nebulization every 6 (six) hours as needed., Starting 08/04/2014, Until Discontinued, Print    levofloxacin (LEVAQUIN) 500 MG tablet Take 1 tablet (500 mg total) by mouth daily., Starting 08/04/2014, Until Discontinued, Print    predniSONE (STERAPRED UNI-PAK 21 TAB) 10 MG (21) TBPK tablet Take 1 tablet (10 mg total) by mouth daily. Take as directed. 6 tablets on 6/24, decrease by one tablet daily and then stop, Starting 08/04/2014, Until Discontinued, Print      CONTINUE these medications which have NOT CHANGED   Details  baclofen (LIORESAL) 10 MG tablet Take 10 mg by mouth 3 (three) times daily as needed for muscle spasms., Until Discontinued, Historical Med    diazepam (VALIUM) 2 MG tablet Take 1 tablet (2 mg total) by mouth at bedtime., Starting 07/12/2014, Until Discontinued, Print    fluticasone (FLONASE) 50 MCG/ACT nasal spray Place 2 sprays into both nostrils daily  as needed for rhinitis., Until Discontinued, Historical Med    gabapentin (NEURONTIN) 300 MG capsule Take 300 mg by mouth 2 (two) times daily., Until Discontinued, Historical Med    ibuprofen (ADVIL,MOTRIN) 800 MG tablet Take 800 mg by mouth every 8 (eight) hours as needed for moderate pain. , Until  Discontinued, Historical Med    lenalidomide (REVLIMID) 10 MG capsule Take 10 mg by mouth daily., Until Discontinued, Historical Med    Melatonin 5 MG TABS Take 1 tablet by mouth at bedtime as needed (for sleep)., Until Discontinued, Historical Med    morphine (MS CONTIN) 15 MG 12 hr tablet Take 1 tablet (15 mg total) by mouth every 8 (eight) hours., Starting 08/02/2014, Until Discontinued, Print    nystatin (MYCOSTATIN) 100000 UNIT/ML suspension Take 10 mLs (1,000,000 Units total) by mouth 4 (four) times daily., Starting 07/17/2014, Until Discontinued, Normal    omeprazole (PRILOSEC) 20 MG capsule Take 20 mg by mouth daily as needed (for indigestion/heartburn). , Until Discontinued, Historical Med    prednisoLONE acetate (PRED FORTE) 1 % ophthalmic suspension Place 1 drop into the right eye 2 (two) times daily., Until Discontinued, Historical Med    prochlorperazine (COMPAZINE) 10 MG tablet Take 10 mg by mouth every 6 (six) hours as needed for nausea or vomiting., Until Discontinued, Historical Med    valACYclovir (VALTREX) 500 MG tablet Take 500 mg by mouth at bedtime., Until Discontinued, Historical Med      STOP taking these medications     dexamethasone (DECADRON) 4 MG tablet      glimepiride (AMARYL) 2 MG tablet      levalbuterol (XOPENEX HFA) 45 MCG/ACT inhaler          DISCHARGE INSTRUCTIONS:   See AVS  If you experience worsening of your admission symptoms, develop shortness of breath, life threatening emergency, suicidal or homicidal thoughts you must seek medical attention immediately by calling 911 or calling your MD immediately  if symptoms less severe.  You Must read complete instructions/literature along with all the possible adverse reactions/side effects for all the Medicines you take and that have been prescribed to you. Take any new Medicines after you have completely understood and accept all the possible adverse reactions/side effects.   Please note  You  were cared for by a hospitalist during your hospital stay. If you have any questions about your discharge medications or the care you received while you were in the hospital after you are discharged, you can call the unit and asked to speak with the hospitalist on call if the hospitalist that took care of you is not available. Once you are discharged, your primary care physician will handle any further medical issues. Please note that NO REFILLS for any discharge medications will be authorized once you are discharged, as it is imperative that you return to your primary care physician (or establish a relationship with a primary care physician if you do not have one) for your aftercare needs so that they can reassess your need for medications and monitor your lab values.    Today   CHIEF COMPLAINT:   Chief Complaint  Patient presents with  . Shortness of Breath    HISTORY OF PRESENT ILLNESS:  Clayson Riling is a 69 y.o. male with a known history of multiple myeloma, COPD, diabetes type 2 who was recently hospitalized about 3 weeks ago with similar type presentation presents with shortness of breath ongoing for 2 weeks now. Patient also reports that his has a dry  cough and am unable to produce any oxygen. Patient has a home pulse ox meter and his sats were running low in the 80s. And due to his worsening respiratory status he decided come to the ED. In the ER patient had a CT scan of the chest which did reveal patchy airspace disease in the right lower lobe as well as a right upper pole kidney mass.    VITAL SIGNS:  Blood pressure 112/58, pulse 83, temperature 97.9 F (36.6 C), temperature source Oral, resp. rate 19, height _0  (1.778 m), weight 68.04 kg (150 lb), SpO2 95 %.  I/O:  No intake or output data in the 24 hours ending 08/06/14 2040  PHYSICAL EXAMINATION:  GENERAL:  69 y.o.-year-old patient lying in the bed with no acute distress. Thin, nasal canula EYES: Pupils equal, round,  reactive to light and accommodation. No scleral icterus. Extraocular muscles intact.  HEENT: Head atraumatic, normocephalic. Oropharynx and nasopharynx clear.  NECK:  Supple, no jugular venous distention. No thyroid enlargement, no tenderness.  LUNGS: Normal breath sounds bilaterally, no wheezing, rales,vrhonchi or crepitation. No use of accessory muscles of respiration.  CARDIOVASCULAR: S1, S2 normal. No murmurs, rubs, or gallops.  ABDOMEN: Soft, non-tender, non-distended. Bowel sounds present. No organomegaly or mass.  EXTREMITIES: No pedal edema, cyanosis, or clubbing.  NEUROLOGIC: Cranial nerves II through XII are intact. Muscle strength 5/5 in all extremities. Sensation intact. Gait not checked.  PSYCHIATRIC: The patient is alert and oriented x 3. anxious SKIN: No obvious rash, lesion, or ulcer.   DATA REVIEW:   CBC  Recent Labs Lab 08/04/14 0539  WBC 4.1  HGB 10.3*  HCT 30.8*  PLT 133*    Chemistries   Recent Labs Lab 08/03/14 0519  08/04/14 0539  NA 137  < > 138  K 2.6*  < > 3.4*  CL 95*  < > 98*  CO2 33*  < > 34*  GLUCOSE 184*  < > 58*  BUN 23*  < > 18  CREATININE 1.10  < > 0.81  CALCIUM 7.8*  < > 8.1*  MG 2.0  --   --   < > = values in this interval not displayed.  Cardiac Enzymes  Recent Labs Lab 08/02/14 1330  TROPONINI 0.03    Microbiology Results  No results found for this or any previous visit.  RADIOLOGY:  No results found.  EKG:   Orders placed or performed during the hospital encounter of 08/02/14  . ED EKG  . ED EKG  . EKG 12-Lead  . EKG 12-Lead  . EKG      Management plans discussed with the patient, family and they are in agreement.  CODE STATUS:  Advance Directive Documentation        Most Recent Value   Type of Advance Directive  Living will   Pre-existing out of facility DNR order (yellow form or pink MOST form)     "MOST" Form in Place?        TOTAL TIME TAKING CARE OF THIS PATIENT: 45 minutes.    Myrtis Ser M.D on 08/06/2014 at 8:40 PM  Between 7am to 6pm - Pager - [phone]  After 6pm go to [email or web address] - password [email or web address] [facility]  Office  [phone]  CC: Primary care physician; Halina Maidens, MD

## 2014-08-07 ENCOUNTER — Telehealth: Payer: Self-pay | Admitting: *Deleted

## 2014-08-07 ENCOUNTER — Telehealth: Payer: Self-pay

## 2014-08-07 LAB — PROTEIN ELECTROPHORESIS, SERUM
A/G Ratio: 1 (ref 0.7–1.7)
Albumin ELP: 2.7 g/dL — ABNORMAL LOW (ref 2.9–4.4)
Alpha-1-Globulin: 0.3 g/dL (ref 0.0–0.4)
Alpha-2-Globulin: 0.8 g/dL (ref 0.4–1.0)
Beta Globulin: 0.7 g/dL (ref 0.7–1.3)
Gamma Globulin: 0.7 g/dL (ref 0.4–1.8)
Globulin, Total: 2.6 g/dL (ref 2.2–3.9)
M-Spike, %: 0.4 g/dL — ABNORMAL HIGH
Total Protein ELP: 5.3 g/dL — ABNORMAL LOW (ref 6.0–8.5)

## 2014-08-07 MED ORDER — ALBUTEROL SULFATE HFA 108 (90 BASE) MCG/ACT IN AERS: 2.0000 | INHALATION_SPRAY | Freq: Four times a day (QID) | RESPIRATORY_TRACT | Status: DC | PRN

## 2014-08-07 NOTE — Telephone Encounter (Signed)
Patient's wife is concerned about his low potassium and wants to know if he should come in sooner than Friday, he sees Dr. Erlene Quan tomorrow.dr

## 2014-08-07 NOTE — Telephone Encounter (Signed)
He should probably come in on Thursday instead so I can get labs and have them back on Friday - since next weekend is a holiday and any labs won't be reviewed until Tuesday.

## 2014-08-07 NOTE — Telephone Encounter (Signed)
Patient is seeing Dr. Kathie Dike on Thursday and will get them to do potassiumdr

## 2014-08-07 NOTE — Telephone Encounter (Signed)
Patient's wife is worried about his potassium it was very low, wants to know if he should come in sooner than Friday July 1.dr

## 2014-08-07 NOTE — Telephone Encounter (Signed)
I will take care of the order tomorrow once Dr. Grayland Ormond returns to clinic.

## 2014-08-07 NOTE — Telephone Encounter (Signed)
Patient needs Ventolin to [facility] cannot afford Xopenex

## 2014-08-08 ENCOUNTER — Encounter: Payer: Self-pay | Admitting: Urology

## 2014-08-08 ENCOUNTER — Ambulatory Visit (INDEPENDENT_AMBULATORY_CARE_PROVIDER_SITE_OTHER): Payer: Commercial Managed Care - HMO | Admitting: Urology

## 2014-08-08 VITALS — BP 103/60 | HR 98 | Ht 70.0 in | Wt 154.0 lb

## 2014-08-08 DIAGNOSIS — N2889 Other specified disorders of kidney and ureter: Secondary | ICD-10-CM

## 2014-08-08 DIAGNOSIS — Z8051 Family history of malignant neoplasm of kidney: Secondary | ICD-10-CM

## 2014-08-08 DIAGNOSIS — C9000 Multiple myeloma not having achieved remission: Secondary | ICD-10-CM | POA: Diagnosis not present

## 2014-08-08 NOTE — Progress Notes (Signed)
08/08/2014 2:43 PM   Russell Keller 1945-10-08 [id number]  Referring provider: Glean Hess, MD [address] [place],  [address]  Chief Complaint  Patient presents with  . 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 He initially presented to the emergency room at [facility] with shortness of breath and underwent workup including CT PA which showed incidental finding of a possible renal mass. He ended up being admitted to the hospital for pneumonia and has since been discharged.  Of note, the mass was seen on previous imaging back in 2012 described as 1.1 cm low attenuating renal lesion which was indeterminate on that particular scan button did not meet the criteria for simple cyst.  He is currently on oral chemotherapy (revlimid) for 2 more days.  His previously received radiation to the lumbar spine and SI joints for myelomatous involvement in 2011 and more recently palliateve radiation to the right hip and inferior pubic ramus in 2-04/2013.  He reports today that he refuses to undergo any further chemotherapy treatment for his multiple myeloma if he has further recurrences down the road.  He denies any history of flank pain or gross hematuria. He denies any urinary complaints today other than getting up once nightly to void, and occasional difficulty starting and stopping his stream. He does also have a rare dribbling of urine if he can get to the bathroom on time but no frank incontinence.   He does report a possible family history of renal cell carcinoma in his mother. He thinks his mother may have passed away in her 48s from metastatic RCC but is not 100% sure.  She also had other malignancies including lung and liver cancer  but is unclear from the history whether these were possibly metastatic lesions.  PMH: Past Medical History  Diagnosis Date  . DVT (deep venous thrombosis)   . Diabetes mellitus without complication   . Hypertension   . COPD (chronic obstructive pulmonary disease)   . Plasmocytoma   . Multiple myeloma     Surgical History: Past Surgical History  Procedure Laterality Date  . Limbal stem cell transplant      Multiple Myleoma  . Tonsillectomy    . 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 Cataract Right   . Ileostomy closure  2012    Home Medications:    Medication List       This list is accurate as of: 08/08/14  2:43 PM.  Always use your most recent med list.               albuterol 108 (90 BASE) MCG/ACT inhaler  Commonly known as:  PROVENTIL HFA;VENTOLIN HFA  Inhale 2 puffs into the lungs every 6 (six) hours as needed for wheezing or shortness of breath.     baclofen 10 MG tablet  Commonly known as:  LIORESAL  Take 10 mg by mouth 3 (three) times daily as needed for muscle spasms.     diazepam 2 MG tablet  Commonly known as:  VALIUM  Take 1 tablet (2 mg total) by mouth at bedtime.     fluticasone 50 MCG/ACT nasal spray  Commonly known as:  FLONASE  Place 2  sprays into both nostrils daily as needed for rhinitis.     gabapentin 300 MG capsule  Commonly known as:  NEURONTIN  Take 300 mg by mouth 2 (two) times daily.     glimepiride 2 MG tablet  Commonly known as:  AMARYL  Take 2 mg by mouth daily with breakfast.     ibuprofen 800 MG tablet  Commonly known as:  ADVIL,MOTRIN  Take 800 mg by mouth every 8 (eight) hours as needed for moderate pain.     ipratropium-albuterol 0.5-2.5 (3) MG/3ML Soln  Commonly known as:  DUONEB  Take 3 mLs by nebulization every 6 (six) hours as needed.     levalbuterol 45 MCG/ACT inhaler  Commonly known as:  XOPENEX HFA  Inhale into the lungs every 4 (four) hours as needed for wheezing.     levofloxacin 500 MG  tablet  Commonly known as:  LEVAQUIN  Take 1 tablet (500 mg total) by mouth daily.     Melatonin 5 MG Tabs  Take 1 tablet by mouth at bedtime as needed (for sleep).     morphine 15 MG 12 hr tablet  Commonly known as:  MS CONTIN  Take 1 tablet (15 mg total) by mouth every 8 (eight) hours.     NOVOLOG FLEXPEN 100 UNIT/ML FlexPen  Generic drug:  insulin aspart  Inject into the skin 3 (three) times daily with meals.     nystatin 100000 UNIT/ML suspension  Commonly known as:  MYCOSTATIN  Take 10 mLs (1,000,000 Units total) by mouth 4 (four) times daily.     omeprazole 20 MG capsule  Commonly known as:  PRILOSEC  Take 20 mg by mouth daily as needed (for indigestion/heartburn).     prednisoLONE acetate 1 % ophthalmic suspension  Commonly known as:  PRED FORTE  Place 1 drop into the right eye 2 (two) times daily.     predniSONE 10 MG (21) Tbpk tablet  Commonly known as:  STERAPRED UNI-PAK 21 TAB  Take 1 tablet (10 mg total) by mouth daily. Take as directed. 6 tablets on 6/24, decrease by one tablet daily and then stop     prochlorperazine 10 MG tablet  Commonly known as:  COMPAZINE  Take 10 mg by mouth every 6 (six) hours as needed for nausea or vomiting.     valACYclovir 500 MG tablet  Commonly known as:  VALTREX  Take 500 mg by mouth at bedtime.        Allergies: No Known Allergies  Family History: Family History  Problem Relation Age of Onset  . Diabetes Mother   . Cancer Mother     Social History:  reports that he has been smoking Cigarettes.  He has a 25 pack-year smoking history. He has never used smokeless tobacco. He reports that he does not drink alcohol or use illicit drugs.  ROS: Urological Symptom Review  Patient is experiencing the following symptoms: Get up at night to urinate Leakage of urine Stream starts and stops Trouble starting stream Have to strain to urinate Erection problems (male only)   Review of Systems  Gastrointestinal (upper)   : Negative for upper GI symptoms  Gastrointestinal (lower) : Constipation  Constitutional : Fever Night Sweats Weight loss Fatigue  Skin: Negative for skin symptoms  Eyes: Blurred vision  Ear/Nose/Throat : Sore throat Sinus problems  Hematologic/Lymphatic: Easy bruising  Cardiovascular : Leg swelling  Respiratory : Cough Shortness of breath  Endocrine: Excessive thirst  Musculoskeletal: Back pain Joint pain  Neurological: Dizziness  Psychologic: Depression Anxiety   Physical Exam: BP 103/60 mmHg  Pulse 98  Ht 5' 10"  (1.778 m)  Wt 154 lb (69.854 kg)  BMI 22.10 kg/m2  Constitutional:  Alert and oriented, No acute distress.   HEENT: [place] AT, moist mucus membranes.  Trachea midline, no masses. Cardiovascular: No clubbing, cyanosis, or edema. Respiratory: Normal respiratory effort, no increased work of breathing. GI: Abdomen is soft, nontender, nondistended, no abdominal masses GU: No CVA tenderness.  Skin: No rashes, bruises or suspicious lesions. Lymph: No cervical or inguinal adenopathy. Neurologic: Grossly intact, no focal deficits, moving all 4 extremities. Psychiatric: Normal mood and affect.  Laboratory Data: Lab Results  Component Value Date   WBC 4.1 08/04/2014   HGB 10.3* 08/04/2014   HCT 30.8* 08/04/2014   MCV 97.8 08/04/2014   PLT 133* 08/04/2014    Lab Results  Component Value Date   CREATININE 0.81 08/04/2014    Lab Results  Component Value Date   PSA 1.5 11/11/2012    No results found for: TESTOSTERONE  Lab Results  Component Value Date   HGBA1C 6.0 12/27/2013    Pertinent Imaging: CTPA 08/02/14 IMPRESSION: No demonstrable pulmonary embolus.  Underlying emphysema. Patchy pneumonia right lower lobe. Mild bibasilar atelectasis. Mild lower lobe bronchiectatic change.  No adenopathy.  Solid mass arising from the upper pole left kidney measuring 2.8 x 2.4 cm. 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  Lytic lesions in  several thoracic vertebral bodies in the right scapula consistent with multiple myeloma.  Assessment & Plan:  69 year old male with multiple comorbidities including relapsing multiple myeloma currently on chemotherapy status post palate of radiation who presents today for incidental 2.8 cm left upper pole renal mass discovered on CTPA.  The entire kidneys are not visualized on this non-renal proctocol CT therefore I have recommended further evaluation today with a dedicated CT abdomen and pelvis for this purpose. It does appear that the mass has slowly increased in size from 1.1 cm to 2.8 of the course of 4 years.  A solid renal mass raises the suspicion of primary renal malignancy.  We discussed this in detail and in regards to the spectrum of renal masses which includes cysts (pure cysts are considered benign), solid masses and everything in between. The risk of metastasis increases as the size of solid renal mass increases. In general, it is believed that the risk of metastasis for renal masses less than 3-4 cm is small (up to approximately 5%) based mainly on large retrospective studies. In some cases and especially in patients of older age and multiple comorbidities a surveillance approach may be appropriate. The treatment of solid renal masses includes: surveillance, cryoablation (percutaneous and laparoscopic) in addition to partial and complete nephrectomy (each with option of laparoscopic, robotic and open depending on appropriateness). Furthermore, nephrectomy appears to be an independent risk factor for the development of chronic kidney disease suggesting that nephron sparing approaches should be implored whenever feasible. We reviewed these options in context of the patients current situation as well as the brief pros and cons of each.  1. Left kidney mass Plan to have patient return in 4 weeks following dedicated CT abdomen pelvis to discuss further treatment options. Given his comorbidities,  most likely will recommend either surveillance or possibly cryotherapy. - Urinalysis, Complete - CT Abdomen Pelvis W Wo Contrast; Future  2. Multiple myeloma Rellapsing multiple myeloma, prognosis uncertain  3. Family history of kidney cancer Questionable family history of metastatic RCC and mother.  Return in about 4 weeks (around 09/05/2014) for f/u CT abd/ pelvis.  Hollice Espy, MD  [facility] [address] [place], [place] [address] [phone]

## 2014-08-09 ENCOUNTER — Other Ambulatory Visit: Payer: Self-pay | Admitting: *Deleted

## 2014-08-09 MED ORDER — DIAZEPAM 2 MG PO TABS: 2.0000 mg | ORAL_TABLET | Freq: Every evening | ORAL | Status: DC | PRN

## 2014-08-10 ENCOUNTER — Inpatient Hospital Stay (HOSPITAL_BASED_OUTPATIENT_CLINIC_OR_DEPARTMENT_OTHER): Payer: Commercial Managed Care - HMO | Admitting: Oncology

## 2014-08-10 ENCOUNTER — Encounter: Payer: Self-pay | Admitting: Internal Medicine

## 2014-08-10 ENCOUNTER — Ambulatory Visit (INDEPENDENT_AMBULATORY_CARE_PROVIDER_SITE_OTHER): Payer: Commercial Managed Care - HMO | Admitting: Internal Medicine

## 2014-08-10 ENCOUNTER — Inpatient Hospital Stay: Payer: Commercial Managed Care - HMO

## 2014-08-10 VITALS — BP 110/69 | HR 85 | Temp 97.9°F | Resp 18 | Ht 70.0 in | Wt 155.2 lb

## 2014-08-10 VITALS — BP 96/56 | HR 96 | Ht 70.0 in | Wt 155.4 lb

## 2014-08-10 DIAGNOSIS — E876 Hypokalemia: Secondary | ICD-10-CM | POA: Diagnosis not present

## 2014-08-10 DIAGNOSIS — N2889 Other specified disorders of kidney and ureter: Secondary | ICD-10-CM

## 2014-08-10 DIAGNOSIS — G8929 Other chronic pain: Secondary | ICD-10-CM

## 2014-08-10 DIAGNOSIS — J431 Panlobular emphysema: Secondary | ICD-10-CM | POA: Diagnosis not present

## 2014-08-10 DIAGNOSIS — I10 Essential (primary) hypertension: Secondary | ICD-10-CM

## 2014-08-10 DIAGNOSIS — E119 Type 2 diabetes mellitus without complications: Secondary | ICD-10-CM | POA: Diagnosis not present

## 2014-08-10 DIAGNOSIS — R197 Diarrhea, unspecified: Secondary | ICD-10-CM | POA: Diagnosis not present

## 2014-08-10 DIAGNOSIS — C9000 Multiple myeloma not having achieved remission: Secondary | ICD-10-CM | POA: Diagnosis not present

## 2014-08-10 DIAGNOSIS — Z86718 Personal history of other venous thrombosis and embolism: Secondary | ICD-10-CM

## 2014-08-10 DIAGNOSIS — C9002 Multiple myeloma in relapse: Secondary | ICD-10-CM

## 2014-08-10 DIAGNOSIS — J3089 Other allergic rhinitis: Secondary | ICD-10-CM

## 2014-08-10 DIAGNOSIS — J189 Pneumonia, unspecified organism: Secondary | ICD-10-CM

## 2014-08-10 DIAGNOSIS — Z794 Long term (current) use of insulin: Secondary | ICD-10-CM | POA: Diagnosis not present

## 2014-08-10 DIAGNOSIS — Z79899 Other long term (current) drug therapy: Secondary | ICD-10-CM

## 2014-08-10 MED ORDER — ZOLEDRONIC ACID 4 MG/5ML IV CONC
4.0000 mg | Freq: Once | INTRAVENOUS | Status: AC
  Administered 2014-08-10: 4 mg via INTRAVENOUS
  Filled 2014-08-10: qty 5

## 2014-08-10 MED ORDER — AZELASTINE HCL 0.1 % NA SOLN: 2.0000 | Freq: Two times a day (BID) | NASAL | Status: AC

## 2014-08-10 MED ORDER — HEPARIN SOD (PORK) LOCK FLUSH 100 UNIT/ML IV SOLN: 250.0000 [IU] | Freq: Once | INTRAVENOUS | Status: DC | PRN

## 2014-08-10 MED ORDER — ALBUTEROL SULFATE HFA 108 (90 BASE) MCG/ACT IN AERS
2.0000 | INHALATION_SPRAY | Freq: Four times a day (QID) | RESPIRATORY_TRACT | Status: DC | PRN
Start: 2014-08-10 — End: 2014-08-10

## 2014-08-10 MED ORDER — SODIUM CHLORIDE 0.9 % IV SOLN
Freq: Once | INTRAVENOUS | Status: AC
  Administered 2014-08-10: 15:00:00 via INTRAVENOUS
  Filled 2014-08-10: qty 1000

## 2014-08-10 MED ORDER — ALBUTEROL SULFATE HFA 108 (90 BASE) MCG/ACT IN AERS
2.0000 | INHALATION_SPRAY | Freq: Four times a day (QID) | RESPIRATORY_TRACT | Status: DC | PRN
Start: 2014-08-10 — End: 2014-08-29

## 2014-08-10 NOTE — Progress Notes (Signed)
Date:  08/10/2014   Name:  Russell Keller   DOB:  1946-01-03   MRN:  [id number]   Chief Complaint: No chief complaint on file. Transition into care from the hospital.  Admitted 08/02/14 with acute respiratory distress and discharged 08/04/14.  Course was complicated by low potassium levels of 2.7.  Supplementation increased to 3.4 at discharge. He finished a course of Levaquin and prednisone. He now has home oxygen by [place] but is not using it much.  He also had low serum calcium on labs. Also discovered a left solid renal mass that had enlarged, suspicious for renal cell cancer.  He has had consult with Urology - plan for a dedicated scan and then will determine if treatment is needed.  Either cryotherapy or resection.  Review of Systems:  Review of Systems  Constitutional: Positive for appetite change and fatigue. Negative for fever.  HENT: Positive for postnasal drip and rhinorrhea. Negative for sore throat and trouble swallowing.   Respiratory: Positive for shortness of breath. Negative for cough, choking and wheezing.   Cardiovascular: Positive for leg swelling. Negative for chest pain.  Gastrointestinal: Negative for constipation and blood in stool.  Genitourinary: Negative for dysuria.  Neurological: Negative for dizziness and headaches.    Patient Active Problem List   Diagnosis Date Noted  . Left kidney mass 08/04/2014  . Acute respiratory failure 08/03/2014  . PNA (pneumonia) 08/02/2014  . Benign prostatic hypertrophy without urinary obstruction 07/15/2014  . Current smoker 07/15/2014  . Arthralgia of hip 07/15/2014  . Acquired polyneuropathy 07/15/2014  . Chronic obstructive pulmonary emphysema 07/15/2014  . CA of skin 07/15/2014  . Muscle spasm 07/13/2014  . Community acquired pneumonia 06/17/2014  . Uncomplicated type 2 diabetes mellitus 06/17/2014  . Essential hypertension 06/17/2014  . DVT (deep venous thrombosis)   . Plasmocytoma   . Multiple myeloma   . Multiple  myeloma in relapse 06/04/2014    Prior to Admission medications   Medication Sig Start Date End Date Taking? Authorizing Provider  albuterol (PROVENTIL HFA;VENTOLIN HFA) 108 (90 BASE) MCG/ACT inhaler Inhale 2 puffs into the lungs every 6 (six) hours as needed for wheezing or shortness of breath. 08/07/14  Yes Glean Hess, MD  baclofen (LIORESAL) 10 MG tablet Take 10 mg by mouth 3 (three) times daily as needed for muscle spasms.   Yes Historical Provider, MD  diazepam (VALIUM) 2 MG tablet Take 1 tablet (2 mg total) by mouth at bedtime as needed for anxiety. 08/09/14  Yes Lloyd Huger, MD  fluticasone (FLONASE) 50 MCG/ACT nasal spray Place 2 sprays into both nostrils daily as needed for rhinitis.   Yes Historical Provider, MD  gabapentin (NEURONTIN) 300 MG capsule Take 300 mg by mouth 2 (two) times daily.   Yes Historical Provider, MD  glimepiride (AMARYL) 2 MG tablet Take 2 mg by mouth daily with breakfast.   Yes Historical Provider, MD  ibuprofen (ADVIL,MOTRIN) 800 MG tablet Take 800 mg by mouth every 8 (eight) hours as needed for moderate pain.    Yes Historical Provider, MD  ipratropium-albuterol (DUONEB) 0.5-2.5 (3) MG/3ML SOLN Take 3 mLs by nebulization every 6 (six) hours as needed. 08/04/14  Yes Aldean Jewett, MD  levalbuterol [facility] HFA) 45 MCG/ACT inhaler Inhale into the lungs every 4 (four) hours as needed for wheezing.   Yes Historical Provider, MD  Melatonin 5 MG TABS Take 1 tablet by mouth at bedtime as needed (for sleep).   Yes Historical Provider, MD  morphine (  MS CONTIN) 15 MG 12 hr tablet Take 1 tablet (15 mg total) by mouth every 8 (eight) hours. 08/02/14  Yes Forest Gleason, MD  nystatin (MYCOSTATIN) 100000 UNIT/ML suspension Take 10 mLs (1,000,000 Units total) by mouth 4 (four) times daily. 07/17/14  Yes Glean Hess, MD  omeprazole (PRILOSEC) 20 MG capsule Take 20 mg by mouth daily as needed (for indigestion/heartburn).    Yes Historical Provider, MD  prednisoLONE  acetate (PRED FORTE) 1 % ophthalmic suspension Place 1 drop into the right eye 2 (two) times daily.   Yes Historical Provider, MD  prochlorperazine (COMPAZINE) 10 MG tablet Take 10 mg by mouth every 6 (six) hours as needed for nausea or vomiting.   Yes Historical Provider, MD  valACYclovir (VALTREX) 500 MG tablet Take 500 mg by mouth at bedtime.   Yes Historical Provider, MD  insulin aspart (NOVOLOG FLEXPEN) 100 UNIT/ML FlexPen Inject into the skin 3 (three) times daily with meals.    Historical Provider, MD  levofloxacin (LEVAQUIN) 500 MG tablet Take 1 tablet (500 mg total) by mouth daily. Patient not taking: Reported on 08/10/2014 08/04/14   Aldean Jewett, MD  predniSONE (STERAPRED UNI-PAK 21 TAB) 10 MG (21) TBPK tablet Take 1 tablet (10 mg total) by mouth daily. Take as directed. 6 tablets on 6/24, decrease by one tablet daily and then stop Patient not taking: Reported on 08/10/2014 08/04/14   Aldean Jewett, MD    No Known Allergies  Past Surgical History  Procedure Laterality Date  . Limbal stem cell transplant      Multiple Myleoma  . Tonsillectomy    . 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 Cataract Right   . Ileostomy closure  2012    History  Substance Use Topics  . Smoking status: Current Every Day Smoker -- 0.50 packs/day for 50 years    Types: Cigarettes  . Smokeless tobacco: Never Used  . Alcohol Use: No     Medication list has been reviewed and updated.  Physical Examination:  Physical Exam  Constitutional: He appears cachectic. No distress.  HENT:  Right Ear: Tympanic membrane and external ear normal.  Left Ear: Tympanic membrane and external ear normal.  Nose: Right sinus exhibits no maxillary sinus tenderness. Left sinus exhibits no maxillary sinus tenderness.  Neck: No tracheal tenderness present. Carotid bruit is not present. No thyromegaly present.  Cardiovascular: Normal rate, regular rhythm and S1 normal.  Exam reveals decreased  pulses.   Pulmonary/Chest: Accessory muscle usage present. He has decreased breath sounds. He has no wheezes.  Musculoskeletal: He exhibits edema (both ankles and feet).  Neurological: He is alert.  Skin: Skin is warm and dry.  Psychiatric: He has a normal mood and affect. His speech is normal.    BP 96/56 mmHg  Pulse 96  Ht 5' 10"  (1.778 m)  Wt 155 lb 6.4 oz (70.489 kg)  BMI 22.30 kg/m2  SpO2 96%  Assessment and Plan: 1. Essential hypertension controlled  2. Community acquired pneumonia Finished antibiotics and prednisone Continue MDI or nebulizer qid as needed - albuterol (PROVENTIL HFA;VENTOLIN HFA) 108 (90 BASE) MCG/ACT inhaler; Inhale 2 puffs into the lungs every 6 (six) hours as needed for wheezing or shortness of breath.  Dispense: 3 Inhaler; Refill: 3  3. Panlobular emphysema - albuterol (PROVENTIL HFA;VENTOLIN HFA) 108 (90 BASE) MCG/ACT inhaler; Inhale 2 puffs into the lungs every 6 (six) hours as needed for wheezing or shortness of breath.  Dispense: 3 Inhaler; Refill: 3  4. Renal mass, left Dedicated renal CT planned - Basic metabolic panel  5. Hypokalemia Will advise on oral supplement Gatorade - Basic metabolic panel  6. Hypocalcemia Will recheck and consider supplementing - Calcium  7. Other allergic rhinitis Flonase is not effective for severe rhinorrhea - azelastine (ASTELIN) 0.1 % nasal spray; Place 2 sprays into both nostrils 2 (two) times daily. Use in each nostril as directed  Dispense: 30 mL; Refill: 0   Halina Maidens, MD [facility]  08/10/2014

## 2014-08-11 ENCOUNTER — Inpatient Hospital Stay: Payer: Self-pay | Admitting: Internal Medicine

## 2014-08-11 LAB — BASIC METABOLIC PANEL
BUN/Creatinine Ratio: 27 — ABNORMAL HIGH (ref 10–22)
BUN: 23 mg/dL (ref 8–27)
CHLORIDE: 98 mmol/L (ref 97–108)
CO2: 25 mmol/L (ref 18–29)
Calcium: 9 mg/dL (ref 8.6–10.2)
Creatinine, Ser: 0.85 mg/dL (ref 0.76–1.27)
GFR, EST AFRICAN AMERICAN: 102 mL/min/{1.73_m2} (ref >59)
GFR, EST NON AFRICAN AMERICAN: 88 mL/min/{1.73_m2} (ref >59)
Glucose: 118 mg/dL — ABNORMAL HIGH (ref 65–99)
Potassium: 3.5 mmol/L (ref 3.5–5.2)
Sodium: 143 mmol/L (ref 134–144)

## 2014-08-12 NOTE — Progress Notes (Signed)
Russell Keller  [phone]) [phone] Fax:(336) [phone]  ID: Nanetta Batty OB: 03-28-1945  MR#: [id number]  IPJ#:[id number]  Patient Care Team: Glean Hess, MD as PCP - General (Family Medicine)  CHIEF COMPLAINT:  Chief Complaint  Patient presents with  . Follow-up    multiple myeloma    INTERVAL HISTORY: Patient returns to clinic today for laboratory work, further evaluation, and continuation of Zometa.  He continues to tolerate Revlimid well, although admits to increasing weakness and fatigue.  He has now completed his XRT and his pain is significantly better controlled. He denies any abdominal pain.  He denies any chest pain or shortness of breath.  He denies any fevers, chills, or night sweats.  He denies any nausea, vomiting, constipation, or diarrhea.  He denies any easy bleeding or bruising.  Patient offers no further specific complaints today.  REVIEW OF SYSTEMS:   Review of Systems  Constitutional: Positive for malaise/fatigue.  Respiratory: Negative.   Cardiovascular: Negative.   Musculoskeletal: Negative.   Neurological: Positive for weakness.    As per HPI. Otherwise, a complete review of systems is negatve.  PAST MEDICAL HISTORY: Past Medical History  Diagnosis Date  . DVT (deep venous thrombosis)   . Diabetes mellitus without complication   . Hypertension   . COPD (chronic obstructive pulmonary disease)   . Plasmocytoma   . Multiple myeloma     PAST SURGICAL HISTORY: Past Surgical History  Procedure Laterality Date  . Limbal stem cell transplant      Multiple Myleoma  . Tonsillectomy    . 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 Cataract Right   . Ileostomy closure  2012    FAMILY HISTORY:  Unchanged. No report of malignancy or other chronic disease.     ADVANCED DIRECTIVES:    HEALTH MAINTENANCE: History  Substance Use Topics  . Smoking status: Current Every Day Smoker -- 0.50 packs/day for 50  years    Types: Cigarettes  . Smokeless tobacco: Never Used  . Alcohol Use: No     Colonoscopy:  PAP:  Bone density:  Lipid panel:  No Known Allergies  Current Outpatient Prescriptions  Medication Sig Dispense Refill  . gabapentin (NEURONTIN) 300 MG capsule Take 300 mg by mouth 2 (two) times daily.    Marland Kitchen ibuprofen (ADVIL,MOTRIN) 800 MG tablet Take 800 mg by mouth every 8 (eight) hours as needed for moderate pain.     Marland Kitchen omeprazole (PRILOSEC) 20 MG capsule Take 20 mg by mouth daily as needed (for indigestion/heartburn).     Marland Kitchen albuterol (PROVENTIL HFA;VENTOLIN HFA) 108 (90 BASE) MCG/ACT inhaler Inhale 2 puffs into the lungs every 6 (six) hours as needed for wheezing or shortness of breath. 3 Inhaler 3  . azelastine (ASTELIN) 0.1 % nasal spray Place 2 sprays into both nostrils 2 (two) times daily. Use in each nostril as directed 30 mL 0  . baclofen (LIORESAL) 10 MG tablet Take 10 mg by mouth 3 (three) times daily as needed for muscle spasms.    . diazepam (VALIUM) 2 MG tablet Take 1 tablet (2 mg total) by mouth at bedtime as needed for anxiety. 30 tablet 0  . fluticasone (FLONASE) 50 MCG/ACT nasal spray Place 2 sprays into both nostrils daily as needed for rhinitis.    Marland Kitchen glimepiride (AMARYL) 2 MG tablet Take 2 mg by mouth daily with breakfast.    . insulin aspart (NOVOLOG FLEXPEN) 100 UNIT/ML FlexPen Inject into the  skin 3 (three) times daily with meals.    Marland Kitchen ipratropium-albuterol (DUONEB) 0.5-2.5 (3) MG/3ML SOLN Take 3 mLs by nebulization every 6 (six) hours as needed. 360 mL 3  . levalbuterol (XOPENEX HFA) 45 MCG/ACT inhaler Inhale into the lungs every 4 (four) hours as needed for wheezing.    Marland Kitchen levofloxacin (LEVAQUIN) 500 MG tablet Take 1 tablet (500 mg total) by mouth daily. 5 tablet 0  . Melatonin 5 MG TABS Take 1 tablet by mouth at bedtime as needed (for sleep).    . morphine (MS CONTIN) 15 MG 12 hr tablet Take 1 tablet (15 mg total) by mouth every 8 (eight) hours. 90 tablet 0  .  nystatin (MYCOSTATIN) 100000 UNIT/ML suspension Take 10 mLs (1,000,000 Units total) by mouth 4 (four) times daily. 473 mL 2  . prednisoLONE acetate (PRED FORTE) 1 % ophthalmic suspension Place 1 drop into the right eye 2 (two) times daily.    . predniSONE (STERAPRED UNI-PAK 21 TAB) 10 MG (21) TBPK tablet Take 1 tablet (10 mg total) by mouth daily. Take as directed. 6 tablets on 6/24, decrease by one tablet daily and then stop (Patient not taking: Reported on 08/10/2014) 21 tablet 0  . prochlorperazine (COMPAZINE) 10 MG tablet Take 10 mg by mouth every 6 (six) hours as needed for nausea or vomiting.    . valACYclovir (VALTREX) 500 MG tablet Take 500 mg by mouth at bedtime.     No current facility-administered medications for this visit.    OBJECTIVE: Filed Vitals:   07/13/14 1225  BP: 114/72  Pulse: 73  Temp: 94.4 F (34.7 C)  Resp: 20     There is no weight on file to calculate BMI.    ECOG FS:0 - Asymptomatic  General: Well-developed, well-nourished, no acute distress. Eyes: anicteric sclera. Lungs: Clear to auscultation bilaterally. Heart: Regular rate and rhythm. No rubs, murmurs, or gallops. Abdomen: Soft, nontender, nondistended. No organomegaly noted, normoactive bowel sounds. Musculoskeletal: No edema, cyanosis, or clubbing. Neuro: Alert, answering all questions appropriately. Cranial nerves grossly intact. Skin: No rashes or petechiae noted. Psych: Normal affect.    LAB RESULTS:  Lab Results  Component Value Date   NA 143 08/10/2014   K 3.5 08/10/2014   CL 98 08/10/2014   CO2 25 08/10/2014   GLUCOSE 118* 08/10/2014   BUN 23 08/10/2014   CREATININE 0.85 08/10/2014   CALCIUM 9.0 08/10/2014   PROT 6.9 06/17/2014   ALBUMIN 3.6 06/17/2014   AST 31 06/17/2014   ALT 40 06/17/2014   ALKPHOS 81 06/17/2014   BILITOT 0.9 06/17/2014   GFRNONAA 88 08/10/2014   GFRAA 102 08/10/2014    Lab Results  Component Value Date   WBC 4.1 08/04/2014   NEUTROABS 2.5 05/15/2014    HGB 10.3* 08/04/2014   HCT 30.8* 08/04/2014   MCV 97.8 08/04/2014   PLT 133* 08/04/2014     STUDIES: Ct Angio Chest Pe W/cm &/or Wo Cm  08/02/2014   CLINICAL DATA:  Shortness of breath.  History of multiple myeloma  EXAM: CT ANGIOGRAPHY CHEST WITH CONTRAST  TECHNIQUE: Multidetector CT imaging of the chest was performed using the standard protocol during bolus administration of intravenous contrast. Multiplanar CT image reconstructions and MIPs were obtained to evaluate the vascular anatomy.  CONTRAST:  37m OMNIPAQUE IOHEXOL 350 MG/ML SOLN  COMPARISON:  Chest radiograph August 02, 2014 ; CT abdomen and pelvis May 17, 2010  FINDINGS: There is no demonstrable pulmonary embolus. There is no thoracic aortic aneurysm  or dissection. There is atherosclerotic change in the aorta. There are scattered small foci of coronary artery calcification. The pericardium is not thickened. There is patchy airspace disease in the right lower lobe, felt to represent foci of pneumonia in the right lower lobe. There is mild bibasilar atelectatic change. There is underlying emphysematous change. There is slight lower lobe bronchiectatic change.  Thyroid appears unremarkable.  There is no demonstrable adenopathy.  In the visualized upper abdomen, there is hepatic steatosis. There is a mass arising from the upper pole of the left kidney measuring 2.8 x 2.4 cm which is solid and is larger compared to prior abdominal CT from 2012.  There is an endplate fracture along the superior aspect of T12. There are small lytic lesions in the T6, T7, and T9 vertebral bodies, felt to be consistent with known multiple myeloma. There is a lytic lesion involving the post rib medial left scapula measuring 5.1 x 2.8 cm.  Review of the MIP images confirms the above findings.  IMPRESSION: No demonstrable pulmonary embolus.  Underlying emphysema. Patchy pneumonia right lower lobe. Mild bibasilar atelectasis. Mild lower lobe bronchiectatic change.  No  adenopathy.  Solid mass arising from the upper pole left kidney measuring 2.8 x 2.4 cm. 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  Lytic lesions in several thoracic vertebral bodies in the right scapula consistent with multiple myeloma.  These results were called by telephone at the time of interpretation on 08/02/2014 at 4:21 pm to Dr. Clearnce Hasten, ED phyiscian, who verbally acknowledged these results.   Electronically Signed   By: Lowella Grip III M.D.   On: 08/02/2014 16:25   Dg Chest Port 1 View  08/02/2014   CLINICAL DATA:  Initial encounter for shortness of breath and syncope today.  EXAM: PORTABLE CHEST - 1 VIEW  COMPARISON:  06/17/2014.  FINDINGS: 1337 hrs. Lungs are hyperexpanded without edema or focal airspace consolidation. No pleural effusion. The cardiopericardial silhouette is within normal limits for size. Imaged bony structures of the thorax are intact. Telemetry leads overlie the chest.  IMPRESSION: Emphysema without acute cardiopulmonary findings.   Electronically Signed   By: Misty Stanley M.D.   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  PLAN:    1.  Multiple myeloma:  Continue low dose Revlimid 10 milligrams daily. His M spike continues to trend down. Patient has also been instructed to continue 81 mg aspirin daily. Proceed with Zometa today. Return to clinic in 3 weeks for laboratory work and then in 4 weeks for continuation of Zometa. 2.  History of DVT/PE: He is no longer taking Coumadin. Patient has reinitiated 81 mg aspirin. 3   Pain: Continue current regimen. XRT has completed. 4.  Hypokalemia: Resolved, continue to monitor.   5.  Diarrhea: Patient has been instructed to use OTC Imodium as needed. 6.  Back pain: Chronic.  Continue ibuprofen as needed.  Patient expressed understanding and was in agreement with this plan. He also understands that He can call clinic at any time with any  questions, concerns, or complaints.   No matching staging information was found for the patient.  Lloyd Huger, MD   08/12/2014 11:46 AM

## 2014-08-14 DIAGNOSIS — R509 Fever, unspecified: Secondary | ICD-10-CM | POA: Diagnosis present

## 2014-08-14 DIAGNOSIS — Z794 Long term (current) use of insulin: Secondary | ICD-10-CM | POA: Diagnosis not present

## 2014-08-14 DIAGNOSIS — Z79899 Other long term (current) drug therapy: Secondary | ICD-10-CM | POA: Insufficient documentation

## 2014-08-14 DIAGNOSIS — I10 Essential (primary) hypertension: Secondary | ICD-10-CM | POA: Insufficient documentation

## 2014-08-14 DIAGNOSIS — Z792 Long term (current) use of antibiotics: Secondary | ICD-10-CM | POA: Insufficient documentation

## 2014-08-14 DIAGNOSIS — Z791 Long term (current) use of non-steroidal anti-inflammatories (NSAID): Secondary | ICD-10-CM | POA: Insufficient documentation

## 2014-08-14 DIAGNOSIS — E119 Type 2 diabetes mellitus without complications: Secondary | ICD-10-CM | POA: Diagnosis not present

## 2014-08-14 DIAGNOSIS — Z7952 Long term (current) use of systemic steroids: Secondary | ICD-10-CM | POA: Diagnosis not present

## 2014-08-14 DIAGNOSIS — R0789 Other chest pain: Secondary | ICD-10-CM | POA: Insufficient documentation

## 2014-08-15 ENCOUNTER — Emergency Department
Admission: EM | Admit: 2014-08-15 | Discharge: 2014-08-15 | Disposition: A | Discharge status: Home / Self Care | Payer: Commercial Managed Care - HMO | Attending: Emergency Medicine | Admitting: Emergency Medicine

## 2014-08-15 ENCOUNTER — Encounter: Payer: Self-pay | Admitting: *Deleted

## 2014-08-15 ENCOUNTER — Emergency Department: Payer: Commercial Managed Care - HMO

## 2014-08-15 DIAGNOSIS — R509 Fever, unspecified: Secondary | ICD-10-CM

## 2014-08-15 LAB — CBC WITH DIFFERENTIAL/PLATELET
BASOS PCT: 2 %
Basophils Absolute: 0.1 10*3/uL (ref 0–0.1)
Eosinophils Absolute: 0 10*3/uL (ref 0–0.7)
Eosinophils Relative: 1 %
HCT: 29.7 % — ABNORMAL LOW (ref 40.0–52.0)
Hemoglobin: 9.9 g/dL — ABNORMAL LOW (ref 13.0–18.0)
LYMPHS ABS: 0.5 10*3/uL — AB (ref 1.0–3.6)
Lymphocytes Relative: 11 %
MCH: 33.3 pg (ref 26.0–34.0)
MCHC: 33.2 g/dL (ref 32.0–36.0)
MCV: 100.3 fL — ABNORMAL HIGH (ref 80.0–100.0)
MONOS PCT: 15 %
Monocytes Absolute: 0.6 10*3/uL (ref 0.2–1.0)
NEUTROS PCT: 71 %
Neutro Abs: 3.1 10*3/uL (ref 1.4–6.5)
Platelets: 103 10*3/uL — ABNORMAL LOW (ref 150–440)
RBC: 2.96 MIL/uL — AB (ref 4.40–5.90)
RDW: 17.9 % — ABNORMAL HIGH (ref 11.5–14.5)
WBC: 4.4 10*3/uL (ref 3.8–10.6)

## 2014-08-15 LAB — COMPREHENSIVE METABOLIC PANEL
ALT: 24 U/L (ref 17–63)
AST: 27 U/L (ref 15–41)
Albumin: 2.9 g/dL — ABNORMAL LOW (ref 3.5–5.0)
Alkaline Phosphatase: 83 U/L (ref 38–126)
Anion gap: 9 (ref 5–15)
BUN: 24 mg/dL — AB (ref 6–20)
CO2: 27 mmol/L (ref 22–32)
CREATININE: 0.84 mg/dL (ref 0.61–1.24)
Calcium: 8.1 mg/dL — ABNORMAL LOW (ref 8.9–10.3)
Chloride: 102 mmol/L (ref 101–111)
GLUCOSE: 186 mg/dL — AB (ref 65–99)
Potassium: 4.2 mmol/L (ref 3.5–5.1)
Sodium: 138 mmol/L (ref 135–145)
Total Bilirubin: 0.7 mg/dL (ref 0.3–1.2)
Total Protein: 5.9 g/dL — ABNORMAL LOW (ref 6.5–8.1)

## 2014-08-15 LAB — URINALYSIS COMPLETE WITH MICROSCOPIC (ARMC ONLY)
BACTERIA UA: NONE SEEN
BILIRUBIN URINE: NEGATIVE
GLUCOSE, UA: 50 mg/dL — AB
Hgb urine dipstick: NEGATIVE
Ketones, ur: NEGATIVE mg/dL
LEUKOCYTES UA: NEGATIVE
NITRITE: NEGATIVE
Protein, ur: NEGATIVE mg/dL
RBC / HPF: NONE SEEN RBC/hpf (ref 0–5)
SQUAMOUS EPITHELIAL / LPF: NONE SEEN
Specific Gravity, Urine: 1.009 (ref 1.005–1.030)
pH: 7 (ref 5.0–8.0)

## 2014-08-15 LAB — LACTIC ACID, PLASMA: Lactic Acid, Venous: 1.8 mmol/L (ref 0.5–2.0)

## 2014-08-15 MED ORDER — PIPERACILLIN-TAZOBACTAM 3.375 G IVPB
3.3750 g | Freq: Once | INTRAVENOUS | Status: AC
  Administered 2014-08-15: 3.375 g via INTRAVENOUS

## 2014-08-15 MED ORDER — PIPERACILLIN-TAZOBACTAM 3.375 G IVPB
INTRAVENOUS | Status: AC
  Administered 2014-08-15: 3.375 g via INTRAVENOUS
  Filled 2014-08-15: qty 50

## 2014-08-15 MED ORDER — VANCOMYCIN HCL IN DEXTROSE 1-5 GM/200ML-% IV SOLN
1000.0000 mg | Freq: Once | INTRAVENOUS | Status: AC
  Administered 2014-08-15: 1000 mg via INTRAVENOUS

## 2014-08-15 MED ORDER — SODIUM CHLORIDE 0.9 % IV BOLUS (SEPSIS)
2100.0000 mL | Freq: Once | INTRAVENOUS | Status: AC
Start: 2014-08-15 — End: 2014-08-15
  Administered 2014-08-15: 2100 mL via INTRAVENOUS

## 2014-08-15 MED ORDER — LEVOFLOXACIN 750 MG PO TABS: 750.0000 mg | ORAL_TABLET | Freq: Every day | ORAL | Status: AC

## 2014-08-15 MED ORDER — VANCOMYCIN HCL IN DEXTROSE 1-5 GM/200ML-% IV SOLN
INTRAVENOUS | Status: AC
  Administered 2014-08-15: 1000 mg via INTRAVENOUS
  Filled 2014-08-15: qty 200

## 2014-08-15 NOTE — ED Notes (Signed)
Pt present w/ c/o new onset fever. Pt just finished chemo for multiple myeloma. Pt also c/o rib pain that has mostly resolved but continues to be intermittently troublesome. Pt denies n/v/d.

## 2014-08-15 NOTE — ED Notes (Signed)
Pt present w/ c/o new onset fever up and down as high as 102.9 oral. Pt just finished chemo for multiple myeloma last [facility] was here last for chemo then admitted for low potassium. Pt also c/o rib pain that has mostly resolved but continues to be intermittently troublesome yesterday. Pt denies n/v/d.

## 2014-08-15 NOTE — ED Provider Notes (Signed)
[facility] Emergency Department Provider Note  ____________________________________________  Time seen: 1:45 AM  I have reviewed the triage vital signs and the nursing notes.   HISTORY  Chief Complaint Code Sepsis      HPI Russell Keller is a 69 y.o. male with history of multiple myeloma currently undergoing chemotherapy patient's oncologist is Dr. Grayland Ormond. Presents tonight with history of fever at home 102.9 with a MAXIMUM TEMPERATURE. Patient denies any cough no dysuria. Patient does admit to bilateral lower rib pain     Past Medical History  Diagnosis Date  . DVT (deep venous thrombosis)   . Diabetes mellitus without complication   . Hypertension   . COPD (chronic obstructive pulmonary disease)   . Plasmocytoma   . Multiple myeloma     Patient Active Problem List   Diagnosis Date Noted  . Renal mass, left 08/10/2014  . Hypokalemia 08/10/2014  . Acute respiratory failure 08/03/2014  . PNA (pneumonia) 08/02/2014  . Benign prostatic hypertrophy without urinary obstruction 07/15/2014  . Current smoker 07/15/2014  . Arthralgia of hip 07/15/2014  . Acquired polyneuropathy 07/15/2014  . Chronic obstructive pulmonary emphysema 07/15/2014  . CA of skin 07/15/2014  . Muscle spasm 07/13/2014  . Community acquired pneumonia 06/17/2014  . Uncomplicated type 2 diabetes mellitus 06/17/2014  . Essential hypertension 06/17/2014  . DVT (deep venous thrombosis)   . Plasmocytoma   . Multiple myeloma in relapse 06/04/2014    Past Surgical History  Procedure Laterality Date  . Limbal stem cell transplant      Multiple Myleoma  . Tonsillectomy    . 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 Cataract Right   . Ileostomy closure  2012    Current Outpatient Rx  Name  Route  Sig  Dispense  Refill  . albuterol (PROVENTIL HFA;VENTOLIN HFA) 108 (90 BASE) MCG/ACT inhaler   Inhalation   Inhale 2 puffs into the lungs every 6 (six)  hours as needed for wheezing or shortness of breath.   3 Inhaler   3   . azelastine (ASTELIN) 0.1 % nasal spray   Each Nare   Place 2 sprays into both nostrils 2 (two) times daily. Use in each nostril as directed   30 mL   0   . baclofen (LIORESAL) 10 MG tablet   Oral   Take 10 mg by mouth 3 (three) times daily as needed for muscle spasms.         . diazepam (VALIUM) 2 MG tablet   Oral   Take 1 tablet (2 mg total) by mouth at bedtime as needed for anxiety.   30 tablet   0   . fluticasone (FLONASE) 50 MCG/ACT nasal spray   Each Nare   Place 2 sprays into both nostrils daily as needed for rhinitis.         Marland Kitchen gabapentin (NEURONTIN) 300 MG capsule   Oral   Take 300 mg by mouth 2 (two) times daily.         Marland Kitchen glimepiride (AMARYL) 2 MG tablet   Oral   Take 2 mg by mouth daily with breakfast.         . ibuprofen (ADVIL,MOTRIN) 800 MG tablet   Oral   Take 800 mg by mouth every 8 (eight) hours as needed for moderate pain.          Marland Kitchen insulin aspart (NOVOLOG FLEXPEN) 100 UNIT/ML FlexPen   Subcutaneous   Inject into  the skin 3 (three) times daily with meals.         Marland Kitchen ipratropium-albuterol (DUONEB) 0.5-2.5 (3) MG/3ML SOLN   Nebulization   Take 3 mLs by nebulization every 6 (six) hours as needed.   360 mL   3   . levalbuterol (XOPENEX HFA) 45 MCG/ACT inhaler   Inhalation   Inhale into the lungs every 4 (four) hours as needed for wheezing.         Marland Kitchen levofloxacin (LEVAQUIN) 750 MG tablet   Oral   Take 1 tablet (750 mg total) by mouth daily.   7 tablet   0   . Melatonin 5 MG TABS   Oral   Take 1 tablet by mouth at bedtime as needed (for sleep).         . morphine (MS CONTIN) 15 MG 12 hr tablet   Oral   Take 1 tablet (15 mg total) by mouth every 8 (eight) hours.   90 tablet   0   . nystatin (MYCOSTATIN) 100000 UNIT/ML suspension   Oral   Take 10 mLs (1,000,000 Units total) by mouth 4 (four) times daily.   473 mL   2   . omeprazole (PRILOSEC) 20 MG  capsule   Oral   Take 20 mg by mouth daily as needed (for indigestion/heartburn).          . prednisoLONE acetate (PRED FORTE) 1 % ophthalmic suspension   Right Eye   Place 1 drop into the right eye 2 (two) times daily.         . predniSONE (STERAPRED UNI-PAK 21 TAB) 10 MG (21) TBPK tablet   Oral   Take 1 tablet (10 mg total) by mouth daily. Take as directed. 6 tablets on 6/24, decrease by one tablet daily and then stop Patient not taking: Reported on 08/10/2014   21 tablet   0   . prochlorperazine (COMPAZINE) 10 MG tablet   Oral   Take 10 mg by mouth every 6 (six) hours as needed for nausea or vomiting.         . valACYclovir (VALTREX) 500 MG tablet   Oral   Take 500 mg by mouth at bedtime.           Allergies Review of patient's allergies indicates no known allergies.  Family History  Problem Relation Age of Onset  . Diabetes Mother   . Cancer Mother     Social History History  Substance Use Topics  . Smoking status: Current Every Day Smoker -- 0.50 packs/day for 50 years    Types: Cigarettes  . Smokeless tobacco: Never Used  . Alcohol Use: No    Review of Systems  Constitutional: Positive for fever. Eyes: Negative for visual changes. ENT: Negative for sore throat. Cardiovascular: Positive bilateral rib pain. Respiratory: Negative for shortness of breath. Gastrointestinal: Negative for abdominal pain, vomiting and diarrhea. Genitourinary: Negative for dysuria. Musculoskeletal: Negative for back pain. Skin: Negative for rash. Neurological: Negative for headaches, focal weakness or numbness.   10-point ROS otherwise negative.  ____________________________________________   PHYSICAL EXAM:  VITAL SIGNS: ED Triage Vitals  Enc Vitals Group     BP 08/15/14 0006 105/65 mmHg     Pulse Rate 08/15/14 0006 97     Resp 08/15/14 0006 28     Temp 08/15/14 0006 99.4 F (37.4 C)     Temp Source 08/15/14 0006 Oral     SpO2 08/15/14 0006 96 %     Weight  08/15/14  0006 155 lb (70.308 kg)     Height 08/15/14 0006 5' 10"  (1.778 m)     Head Cir --      Peak Flow --      Pain Score 08/15/14 0007 0     Pain Loc --      Pain Edu? --      Excl. in [place]? --      Constitutional: Alert and oriented. Well appearing and in no distress. Eyes: Conjunctivae are normal. PERRL. Normal extraocular movements. ENT   Head: Normocephalic and atraumatic.   Nose: No congestion/rhinnorhea.   Mouth/Throat: Mucous membranes are moist.   Neck: No stridor Cardiovascular: Normal rate, regular rhythm. Normal and symmetric distal pulses are present in all extremities. No murmurs, rubs, or gallops. Respiratory: Normal respiratory effort without tachypnea nor retractions. Breath sounds are clear and equal bilaterally. No wheezes/rales/rhonchi. Gastrointestinal: Soft and nontender. No distention. There is no CVA tenderness. Genitourinary: deferred Musculoskeletal: Nontender with normal range of motion in all extremities. No joint effusions.  No lower extremity tenderness nor edema. Neurologic:  Normal speech and language. No gross focal neurologic deficits are appreciated. Speech is normal.  Skin:  Skin is warm, dry and intact. No rash noted. Psychiatric: Mood and affect are normal. Speech and behavior are normal. Patient exhibits appropriate insight and judgment.  ____________________________________________    LABS (pertinent positives/negatives)  Labs Reviewed  COMPREHENSIVE METABOLIC PANEL - Abnormal; Notable for the following:    Glucose, Bld 186 (*)    BUN 24 (*)    Calcium 8.1 (*)    Total Protein 5.9 (*)    Albumin 2.9 (*)    All other components within normal limits  CBC WITH DIFFERENTIAL/PLATELET - Abnormal; Notable for the following:    RBC 2.96 (*)    Hemoglobin 9.9 (*)    HCT 29.7 (*)    MCV 100.3 (*)    RDW 17.9 (*)    Platelets 103 (*)    Lymphs Abs 0.5 (*)    All other components within normal limits  URINALYSIS COMPLETEWITH  MICROSCOPIC ([facility] ONLY) - Abnormal; Notable for the following:    Color, Urine YELLOW (*)    APPearance CLEAR (*)    Glucose, UA 50 (*)    All other components within normal limits  CULTURE, BLOOD (ROUTINE X 2)  CULTURE, BLOOD (ROUTINE X 2)  URINE CULTURE  LACTIC ACID, PLASMA  LACTIC ACID, PLASMA     ____________________________________________   EKG    ____________________________________________    RADIOLOGY  Chest x-ray revealed:IMPRESSION: COPD without superimposed acute cardiopulmonary process.  Osteopenia with expansile lytic lesion RIGHT scapula consistent with history of multiple myeloma.   INITIAL IMPRESSION / ASSESSMENT AND PLAN / ED COURSE  Pertinent labs & imaging results that were available during my care of the patient were reviewed by me and considered in my medical decision making (see chart for details).  History physical exam and laboratory data oral discussed with Dr. Grayland Ormond. Etiology of the patient's fever was not discovered in the emergency department however given his immunocompromise state patient received IV vancomycin and Zosyn in the ED. Patient will be discharged home with Levaquin while we await culture results. Dr. Grayland Ormond is agreeable with discharge plan ____________________________________________   FINAL CLINICAL IMPRESSION(S) / ED DIAGNOSES  Final diagnoses:  Fever, unspecified fever cause      Gregor Hams, MD 08/15/14 [phone]

## 2014-08-15 NOTE — Discharge Instructions (Signed)

## 2014-08-16 ENCOUNTER — Ambulatory Visit
Admission: RE | Admit: 2014-08-16 | Discharge: 2014-08-16 | Disposition: A | Discharge status: Home / Self Care | Payer: Commercial Managed Care - HMO | Source: Ambulatory Visit | Attending: Urology | Admitting: Urology

## 2014-08-16 DIAGNOSIS — J90 Pleural effusion, not elsewhere classified: Secondary | ICD-10-CM | POA: Insufficient documentation

## 2014-08-16 DIAGNOSIS — I700 Atherosclerosis of aorta: Secondary | ICD-10-CM | POA: Insufficient documentation

## 2014-08-16 DIAGNOSIS — R918 Other nonspecific abnormal finding of lung field: Secondary | ICD-10-CM | POA: Insufficient documentation

## 2014-08-16 DIAGNOSIS — C9000 Multiple myeloma not having achieved remission: Secondary | ICD-10-CM | POA: Diagnosis not present

## 2014-08-16 DIAGNOSIS — N2889 Other specified disorders of kidney and ureter: Secondary | ICD-10-CM | POA: Insufficient documentation

## 2014-08-16 MED ORDER — IOHEXOL 350 MG/ML SOLN
100.0000 mL | Freq: Once | INTRAVENOUS | Status: AC | PRN
  Administered 2014-08-16: 100 mL via INTRAVENOUS

## 2014-08-17 LAB — URINE CULTURE: Culture: 3000

## 2014-08-20 LAB — CULTURE, BLOOD (ROUTINE X 2)
Culture: NO GROWTH
Culture: NO GROWTH

## 2014-08-21 NOTE — Progress Notes (Signed)
[place]  [phone] [phone]) [phone]  ID: Nanetta Batty OB: May 29, 1945  MR#: [id number]  JSH#:[id number]  Patient Care Team: Glean Hess, MD as PCP - General (Family Medicine) Algernon Huxley, MD as Referring Physician (Vascular Surgery)  CHIEF COMPLAINT:  Multiple myeloma.  INTERVAL HISTORY: Patient returns to clinic today for laboratory work, further evaluation, and continuation of Zometa.  He continues to tolerate Revlimid well, although admits to increasing weakness and fatigue.  He has now completed his XRT and his pain is significantly better controlled. He denies any abdominal pain.  He denies any chest pain or shortness of breath.  He denies any fevers, chills, or night sweats.  He denies any nausea, vomiting, constipation, or diarrhea.  He denies any easy bleeding or bruising.  Patient offers no further specific complaints today.  REVIEW OF SYSTEMS:   Review of Systems  Constitutional: Positive for malaise/fatigue.  Respiratory: Negative.   Cardiovascular: Negative.   Musculoskeletal: Negative.   Neurological: Positive for weakness.    As per HPI. Otherwise, a complete review of systems is negatve.  PAST MEDICAL HISTORY: Past Medical History  Diagnosis Date  . DVT (deep venous thrombosis)   . Hypertension   . COPD (chronic obstructive pulmonary disease)   . Diabetes mellitus without complication     Patient states he takes Glimeperide  . Plasmocytoma 2004  . Multiple myeloma     PAST SURGICAL HISTORY: Past Surgical History  Procedure Laterality Date  . Limbal stem cell transplant      Multiple Myleoma  . Tonsillectomy    . 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 Cataract Right   . Ileostomy closure  2012    FAMILY HISTORY:  Unchanged. No report of malignancy or other chronic disease.     ADVANCED DIRECTIVES:    HEALTH MAINTENANCE: History  Substance Use Topics  . Smoking status: Current  Every Day Smoker -- 0.50 packs/day for 50 years    Types: Cigarettes  . Smokeless tobacco: Never Used  . Alcohol Use: No     Colonoscopy:  PAP:  Bone density:  Lipid panel:  No Known Allergies  Current Outpatient Prescriptions  Medication Sig Dispense Refill  . albuterol (PROVENTIL HFA;VENTOLIN HFA) 108 (90 BASE) MCG/ACT inhaler Inhale 2 puffs into the lungs every 6 (six) hours as needed for wheezing or shortness of breath. 3 Inhaler 3  . azelastine (ASTELIN) 0.1 % nasal spray Place 2 sprays into both nostrils 2 (two) times daily. Use in each nostril as directed 30 mL 0  . baclofen (LIORESAL) 10 MG tablet Take 10 mg by mouth 3 (three) times daily as needed for muscle spasms.    . diazepam (VALIUM) 2 MG tablet Take 1 tablet (2 mg total) by mouth at bedtime as needed for anxiety. 30 tablet 0  . fluticasone (FLONASE) 50 MCG/ACT nasal spray Place 2 sprays into both nostrils daily as needed for rhinitis.    Marland Kitchen gabapentin (NEURONTIN) 300 MG capsule Take 300 mg by mouth 2 (two) times daily.    Marland Kitchen glimepiride (AMARYL) 2 MG tablet Take 2 mg by mouth daily with breakfast.    . ibuprofen (ADVIL,MOTRIN) 800 MG tablet Take 800 mg by mouth every 8 (eight) hours as needed for moderate pain.     Marland Kitchen insulin aspart (NOVOLOG FLEXPEN) 100 UNIT/ML FlexPen Inject into the skin 3 (three) times daily with meals.    Marland Kitchen ipratropium-albuterol (DUONEB) 0.5-2.5 (3) MG/3ML SOLN  Take 3 mLs by nebulization every 6 (six) hours as needed. 360 mL 3  . levalbuterol (XOPENEX HFA) 45 MCG/ACT inhaler Inhale into the lungs every 4 (four) hours as needed for wheezing.    Marland Kitchen levofloxacin (LEVAQUIN) 750 MG tablet Take 1 tablet (750 mg total) by mouth daily. 7 tablet 0  . Melatonin 5 MG TABS Take 1 tablet by mouth at bedtime as needed (for sleep).    . morphine (MS CONTIN) 15 MG 12 hr tablet Take 1 tablet (15 mg total) by mouth every 8 (eight) hours. 90 tablet 0  . nystatin (MYCOSTATIN) 100000 UNIT/ML suspension Take 10 mLs (1,000,000  Units total) by mouth 4 (four) times daily. 473 mL 2  . omeprazole (PRILOSEC) 20 MG capsule Take 20 mg by mouth daily as needed (for indigestion/heartburn).     . prednisoLONE acetate (PRED FORTE) 1 % ophthalmic suspension Place 1 drop into the right eye 2 (two) times daily.    . predniSONE (STERAPRED UNI-PAK 21 TAB) 10 MG (21) TBPK tablet Take 1 tablet (10 mg total) by mouth daily. Take as directed. 6 tablets on 6/24, decrease by one tablet daily and then stop (Patient not taking: Reported on 08/10/2014) 21 tablet 0  . prochlorperazine (COMPAZINE) 10 MG tablet Take 10 mg by mouth every 6 (six) hours as needed for nausea or vomiting.    . valACYclovir (VALTREX) 500 MG tablet Take 500 mg by mouth at bedtime.     No current facility-administered medications for this visit.    OBJECTIVE: There were no vitals filed for this visit.   There is no weight on file to calculate BMI.    ECOG FS:0 - Asymptomatic  General: Well-developed, well-nourished, no acute distress. Eyes: anicteric sclera. Lungs: Clear to auscultation bilaterally. Heart: Regular rate and rhythm. No rubs, murmurs, or gallops. Abdomen: Soft, nontender, nondistended. No organomegaly noted, normoactive bowel sounds. Musculoskeletal: No edema, cyanosis, or clubbing. Neuro: Alert, answering all questions appropriately. Cranial nerves grossly intact. Skin: No rashes or petechiae noted. Psych: Normal affect.    LAB RESULTS:  Lab Results  Component Value Date   NA 138 08/15/2014   K 4.2 08/15/2014   CL 102 08/15/2014   CO2 27 08/15/2014   GLUCOSE 186* 08/15/2014   BUN 24* 08/15/2014   CREATININE 0.84 08/15/2014   CALCIUM 8.1* 08/15/2014   PROT 5.9* 08/15/2014   ALBUMIN 2.9* 08/15/2014   AST 27 08/15/2014   ALT 24 08/15/2014   ALKPHOS 83 08/15/2014   BILITOT 0.7 08/15/2014   GFRNONAA >60 08/15/2014   GFRAA >60 08/15/2014    Lab Results  Component Value Date   WBC 4.4 08/15/2014   NEUTROABS 3.1 08/15/2014   HGB 9.9*  08/15/2014   HCT 29.7* 08/15/2014   MCV 100.3* 08/15/2014   PLT 103* 08/15/2014     STUDIES: Ct Abdomen Pelvis W Wo Contrast  08/16/2014   CLINICAL DATA:  Left renal mass seen on recent chest CT. Subsequent encounter.  EXAM: CT ABDOMEN AND PELVIS WITHOUT AND WITH CONTRAST  TECHNIQUE: Multidetector CT imaging of the abdomen and pelvis was performed following the standard protocol before and following the bolus administration of intravenous contrast.  CONTRAST:  124m OMNIPAQUE IOHEXOL 350 MG/ML SOLN  COMPARISON:  CT chest from 08/02/2014. Abdomen and pelvis CT from 05/17/2010.  FINDINGS: Lower chest: Patchy right lower lobe airspace disease seen previously has improved in the interval, but there is new interstitial and alveolar opacities in the medial left lung base, suspicious for pneumonia.  Small left pleural effusion is new in the interval.  Hepatobiliary: Scattered tiny hypodensities in the liver parenchyma are compatible with a cyst. 3 cm calcified gallstone is evident with associated gallbladder distention, but no evidence gallbladder wall thickening or pericholecystic fluid. Mild intrahepatic biliary duct prominence is evident. The common bile duct measures 7 mm in diameter at the level of the pancreatic head, upper normal for patient age.  Pancreas: There is no evidence for gallstones, gallbladder wall thickening, or pericholecystic fluid.  Spleen: No splenomegaly. No focal mass lesion.  Adrenals/Urinary Tract: No adrenal nodule or mass. Multiple tiny hypo attenuating lesions are seen in the kidneys bilaterally, many of which are too small to fully characterize. 3.0 x 2.5 cm exophytic homogeneous lesion is seen emanating from the upper pole of the left kidney. This has an average attenuation of about 46 Hounsfield units on precontrast imaging and increases to 67 Hounsfield units on postcontrast imaging. The lesion remains relatively homogeneous after IV contrast administration. No evidence for  hydronephrosis. Left upper pole renal lesion does not extend into the central sinus fat. The left renal vein courses anterior to the aorta and is patent without filling defect.  No evidence for hydroureter.  Urinary bladder is unremarkable.  Stomach/Bowel: Stomach is nondistended. No gastric wall thickening. No evidence of outlet obstruction. Duodenum is normally positioned as is the ligament of Treitz. No small bowel wall thickening. No small bowel dilatation. Terminal ileum is normal. The appendix is normal. Small bowel anastomosis is seen in the central pelvis. There is anastomotic suture line in the distal sigmoid colon.  Vascular/Lymphatic: There is abdominal aortic atherosclerosis without aneurysm. No abdominal lymphadenopathy. No evidence for pelvic sidewall lymphadenopathy.  Reproductive: Prostate gland and seminal vesicles are unremarkable.  Other: No intraperitoneal free fluid.  Musculoskeletal: Soft tissue replacement is seen bilaterally in the lower bony pelvis with destruction of the right inferior pubic ramus. This is compatible with multiple myeloma and was evaluated by MRI on 03/14/2014. Bony involvement is seen and multiple vertebral bodies and there is near complete collapse of L3 with superior endplate compression deformity visible at T12, L2, and L5.  IMPRESSION: 1. 2.5 cm homogeneous exophytic lesion in the upper pole the left kidney shows low level homogeneous enhancement after IV contrast administration. This lesion has increased in size from 1.2 cm on the study from 4 years ago. As such, imaging features are concerning for neoplasm and papillary renal cell carcinoma would be a consideration. 2. Interval development of patchy airspace disease in the left lower lobe, suspicious for pneumonia. Previous is seen alveolar disease in the right lower lobe has improved slightly in the interval. 3. Bony changes consistent with the reported history of multiple myeloma.   Electronically Signed   By: Misty Stanley M.D.   On: 08/16/2014 14:15   Dg Chest 2 View  08/15/2014   CLINICAL DATA:  New onset fever, recent completion of chemotherapy for multiple myeloma.  EXAM: CHEST  2 VIEW  COMPARISON:  Chest radiograph August 02, 2014 and CT chest August 02, 2014  FINDINGS: Cardiac silhouette is unremarkable, mildly calcified aortic knob. Increased lung volumes with flattened hemidiaphragms, mild chronic interstitial changes consistent with COPD. No pneumothorax. Osteopenia with multiple old anterior rib fractures. Expansile lytic RIGHT scapula.  IMPRESSION: COPD without superimposed acute cardiopulmonary process.  Osteopenia with expansile lytic lesion RIGHT scapula consistent with history of multiple myeloma.   Electronically Signed   By: Elon Alas M.D.   On: 08/15/2014 00:56  Ct Angio Chest Pe W/cm &/or Wo Cm  08/02/2014   CLINICAL DATA:  Shortness of breath.  History of multiple myeloma  EXAM: CT ANGIOGRAPHY CHEST WITH CONTRAST  TECHNIQUE: Multidetector CT imaging of the chest was performed using the standard protocol during bolus administration of intravenous contrast. Multiplanar CT image reconstructions and MIPs were obtained to evaluate the vascular anatomy.  CONTRAST:  79m OMNIPAQUE IOHEXOL 350 MG/ML SOLN  COMPARISON:  Chest radiograph August 02, 2014 ; CT abdomen and pelvis May 17, 2010  FINDINGS: There is no demonstrable pulmonary embolus. There is no thoracic aortic aneurysm or dissection. There is atherosclerotic change in the aorta. There are scattered small foci of coronary artery calcification. The pericardium is not thickened. There is patchy airspace disease in the right lower lobe, felt to represent foci of pneumonia in the right lower lobe. There is mild bibasilar atelectatic change. There is underlying emphysematous change. There is slight lower lobe bronchiectatic change.  Thyroid appears unremarkable.  There is no demonstrable adenopathy.  In the visualized upper abdomen, there is hepatic  steatosis. There is a mass arising from the upper pole of the left kidney measuring 2.8 x 2.4 cm which is solid and is larger compared to prior abdominal CT from 2012.  There is an endplate fracture along the superior aspect of T12. There are small lytic lesions in the T6, T7, and T9 vertebral bodies, felt to be consistent with known multiple myeloma. There is a lytic lesion involving the post rib medial left scapula measuring 5.1 x 2.8 cm.  Review of the MIP images confirms the above findings.  IMPRESSION: No demonstrable pulmonary embolus.  Underlying emphysema. Patchy pneumonia right lower lobe. Mild bibasilar atelectasis. Mild lower lobe bronchiectatic change.  No adenopathy.  Solid mass arising from the upper pole left kidney measuring 2.8 x 2.4 cm. 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  Lytic lesions in several thoracic vertebral bodies in the right scapula consistent with multiple myeloma.  These results were called by telephone at the time of interpretation on 08/02/2014 at 4:21 pm to Dr. SClearnce Hasten ED phyiscian, who verbally acknowledged these results.   Electronically Signed   By: WLowella GripIII M.D.   On: 08/02/2014 16:25   Dg Chest Port 1 View  08/02/2014   CLINICAL DATA:  Initial encounter for shortness of breath and syncope today.  EXAM: PORTABLE CHEST - 1 VIEW  COMPARISON:  06/17/2014.  FINDINGS: 1337 hrs. Lungs are hyperexpanded without edema or focal airspace consolidation. No pleural effusion. The cardiopericardial silhouette is within normal limits for size. Imaged bony structures of the thorax are intact. Telemetry leads overlie the chest.  IMPRESSION: Emphysema without acute cardiopulmonary findings.   Electronically Signed   By: EMisty StanleyM.D.   On: 08/02/2014 13:49    ASSESSMENT: Multiple myeloma[facility] [place]  PLAN:    1.  Multiple myeloma:  Given patient's increasing weakness and fatigue, he wishes to  discontinue Revlimid at this time. His M spike has plateaued and is now 0.5. Continue 81 mg aspirin daily. Proceed with Zometa today. Return to clinic in 3 weeks for laboratory work and then in 4 weeks for continuation of Zometa. 2.  History of DVT/PE: He is no longer taking Coumadin. Patient has reinitiated 81 mg aspirin. 3   Pain: Continue current regimen. XRT has completed. 4.  Hypokalemia: Resolved, continue to monitor.   5.  Diarrhea: Patient has been instructed to  use OTC Imodium as needed. 6.  Back pain: Chronic.  Continue ibuprofen as needed.  Patient expressed understanding and was in agreement with this plan. He also understands that He can call clinic at any time with any questions, concerns, or complaints.    Lloyd Huger, MD   08/21/2014 9:44 AM

## 2014-08-22 ENCOUNTER — Encounter
Admission: RE | Disposition: A | Discharge status: Home / Self Care | Payer: Self-pay | Source: Ambulatory Visit | Attending: Vascular Surgery

## 2014-08-22 ENCOUNTER — Encounter: Payer: Self-pay | Admitting: *Deleted

## 2014-08-22 ENCOUNTER — Ambulatory Visit
Admission: RE | Admit: 2014-08-22 | Discharge: 2014-08-22 | Disposition: A | Discharge status: Home / Self Care | Payer: Commercial Managed Care - HMO | Source: Ambulatory Visit | Attending: Vascular Surgery | Admitting: Vascular Surgery

## 2014-08-22 DIAGNOSIS — Z86718 Personal history of other venous thrombosis and embolism: Secondary | ICD-10-CM | POA: Insufficient documentation

## 2014-08-22 DIAGNOSIS — Z794 Long term (current) use of insulin: Secondary | ICD-10-CM | POA: Diagnosis not present

## 2014-08-22 DIAGNOSIS — J449 Chronic obstructive pulmonary disease, unspecified: Secondary | ICD-10-CM | POA: Insufficient documentation

## 2014-08-22 DIAGNOSIS — C9000 Multiple myeloma not having achieved remission: Secondary | ICD-10-CM | POA: Diagnosis present

## 2014-08-22 DIAGNOSIS — F17210 Nicotine dependence, cigarettes, uncomplicated: Secondary | ICD-10-CM | POA: Diagnosis not present

## 2014-08-22 DIAGNOSIS — I10 Essential (primary) hypertension: Secondary | ICD-10-CM | POA: Diagnosis not present

## 2014-08-22 DIAGNOSIS — E119 Type 2 diabetes mellitus without complications: Secondary | ICD-10-CM | POA: Insufficient documentation

## 2014-08-22 HISTORY — DX: Malignant (primary) neoplasm, unspecified: C80.1

## 2014-08-22 HISTORY — PX: PERIPHERAL VASCULAR CATHETERIZATION: SHX172C

## 2014-08-22 SURGERY — PORTA CATH INSERTION
Anesthesia: Moderate Sedation

## 2014-08-22 MED ORDER — SODIUM CHLORIDE 0.9 % IV SOLN
INTRAVENOUS | Status: DC
  Administered 2014-08-22: 14:00:00 via INTRAVENOUS

## 2014-08-22 MED ORDER — FENTANYL CITRATE (PF) 100 MCG/2ML IJ SOLN
INTRAMUSCULAR | Status: AC
  Filled 2014-08-22: qty 2

## 2014-08-22 MED ORDER — MIDAZOLAM HCL 2 MG/2ML IJ SOLN
INTRAMUSCULAR | Status: AC
  Filled 2014-08-22: qty 2

## 2014-08-22 MED ORDER — MIDAZOLAM HCL 5 MG/5ML IJ SOLN
INTRAMUSCULAR | Status: AC
  Filled 2014-08-22: qty 5

## 2014-08-22 MED ORDER — FENTANYL CITRATE (PF) 100 MCG/2ML IJ SOLN
INTRAMUSCULAR | Status: DC | PRN
  Administered 2014-08-22 (×3): 50 ug via INTRAVENOUS
  Administered 2014-08-22: 25 ug via INTRAVENOUS
  Administered 2014-08-22: 50 ug via INTRAVENOUS

## 2014-08-22 MED ORDER — HEPARIN (PORCINE) IN NACL 2-0.9 UNIT/ML-% IJ SOLN
INTRAMUSCULAR | Status: AC
  Filled 2014-08-22: qty 500

## 2014-08-22 MED ORDER — MIDAZOLAM HCL 2 MG/2ML IJ SOLN
INTRAMUSCULAR | Status: DC | PRN
  Administered 2014-08-22 (×3): 1 mg via INTRAVENOUS
  Administered 2014-08-22: 2 mg via INTRAVENOUS
  Administered 2014-08-22: 1 mg via INTRAVENOUS

## 2014-08-22 MED ORDER — CEFAZOLIN SODIUM 1-5 GM-% IV SOLN
1.0000 g | Freq: Once | INTRAVENOUS | Status: AC
  Administered 2014-08-22: 1 g via INTRAVENOUS

## 2014-08-22 MED ORDER — LIDOCAINE-EPINEPHRINE (PF) 1 %-1:200000 IJ SOLN
INTRAMUSCULAR | Status: AC
  Filled 2014-08-22: qty 30

## 2014-08-22 SURGICAL SUPPLY — 3 items
PACK ANGIOGRAPHY (CUSTOM PROCEDURE TRAY) ×3 IMPLANT
PORTACATH POWER 8F (Port) ×3 IMPLANT
TOWEL OR 17X26 4PK STRL BLUE (TOWEL DISPOSABLE) ×3 IMPLANT

## 2014-08-22 NOTE — Op Note (Signed)
OPERATIVE NOTE   PROCEDURE: 1. Placement of a left IJ Infuse-a-Port  PRE-OPERATIVE DIAGNOSIS: Multiple myeloma  POST-OPERATIVE DIAGNOSIS: Same  SURGEON: Katha Cabal M.D.  ANESTHESIA: Conscious sedation combined with 1% lidocaine with epinephrine  ESTIMATED BLOOD LOSS: Minimal   FINDING(S): 1.  Patent vein  SPECIMEN(S): None  INDICATIONS:   Russell Keller is a 69 y.o. male who presents with multiple myeloma.  DESCRIPTION: After obtaining full informed written consent, the patient was brought back to the special procedure suite and placed in the supine position. The patient's left neck and chest wall are prepped and draped in sterile fashion. Appropriate timeout was called.  Ultrasound is placed in a sterile sleeve, ultrasound is utilized to avoid vascular injury as well as secondary to lack of appropriate landmarks. The left IJ vein is identified. It is echolucent and homogeneous as well as easily compressible indicating patency. 1% lidocaine is infiltrated into the soft tissue at the base of the neck as well as on the chest wall.  Under direct ultrasound visualization Seldinger needle is inserted into the left internal jugular vein. J-wire is advanced under fluoroscopic guidance. A small counterincision was created at the wire insertion site. A transverse incision is created 2 fingerbreadths below the scapula and a pocket is fashioned using both blunt and sharp dissection. The pocket is tested for appropriate size with the hub of the Infuse-a-Port. The tunneling device is then used to pull the intravascular portion of the catheter from the pocket to the neck counterincision.  Dilator and peel-away sheath were then inserted over the wire and the wire is removed. Catheter is then advanced into the venous system without difficulty. Peel-away sheath was then removed.  Catheter is then positioned under fluoroscopic guidance at the atrial caval junction. It is then transected  connected to the hub and the hope is slipped into the subcutaneous pocket on the chest wall. The hub was then accessed percutaneously and aspirates easily and flushes well and is flushed with 30 cc of heparinized saline. The pocket incision is then closed in layers using interrupted 3-0 Vicryl for the subcutaneous tissues and 4-0 Monocryl subcuticular for skin closure. Dermabond is applied. The neck counterincision was closed with 4-0 Monocryl subcuticular and Dermabond as well.  The patient tolerated the procedure well and there were no immediate complications.  COMPLICATIONS: None  CONDITION: Unchanged  Katha Cabal M.D. [facility] Office: [phone]   08/22/2014, 6:17 PM

## 2014-08-22 NOTE — H&P (Signed)
[facility] History & Physical Update  The patient was interviewed and re-examined.  The patient's previous History and Physical has been reviewed and is unchanged.  There is no change in the plan of care. We plan to proceed with the scheduled procedure.  Daisie Haft, Dolores Lory, MD  08/22/2014, 6:17 PM

## 2014-08-24 ENCOUNTER — Encounter: Payer: Self-pay | Admitting: Vascular Surgery

## 2014-08-29 ENCOUNTER — Other Ambulatory Visit: Payer: Self-pay | Admitting: Internal Medicine

## 2014-08-29 ENCOUNTER — Telehealth: Payer: Self-pay | Admitting: *Deleted

## 2014-08-29 ENCOUNTER — Telehealth: Payer: Self-pay

## 2014-08-29 DIAGNOSIS — J431 Panlobular emphysema: Secondary | ICD-10-CM

## 2014-08-29 DIAGNOSIS — J189 Pneumonia, unspecified organism: Secondary | ICD-10-CM

## 2014-08-29 MED ORDER — ALBUTEROL SULFATE HFA 108 (90 BASE) MCG/ACT IN AERS: 2.0000 | INHALATION_SPRAY | Freq: Four times a day (QID) | RESPIRATORY_TRACT | Status: AC | PRN

## 2014-08-29 MED ORDER — MORPHINE SULFATE ER 15 MG PO TBCR
15.0000 mg | EXTENDED_RELEASE_TABLET | Freq: Three times a day (TID) | ORAL | Status: DC
Start: 2014-08-29 — End: 2014-10-09

## 2014-08-29 NOTE — Telephone Encounter (Signed)
Will pick up Thursday

## 2014-08-29 NOTE — Telephone Encounter (Signed)
Please resend inhaler to [email or web address]

## 2014-08-31 ENCOUNTER — Inpatient Hospital Stay: Payer: Commercial Managed Care - HMO | Attending: Oncology

## 2014-08-31 DIAGNOSIS — E119 Type 2 diabetes mellitus without complications: Secondary | ICD-10-CM | POA: Insufficient documentation

## 2014-08-31 DIAGNOSIS — Z86718 Personal history of other venous thrombosis and embolism: Secondary | ICD-10-CM | POA: Insufficient documentation

## 2014-08-31 DIAGNOSIS — Z79899 Other long term (current) drug therapy: Secondary | ICD-10-CM | POA: Insufficient documentation

## 2014-08-31 DIAGNOSIS — R197 Diarrhea, unspecified: Secondary | ICD-10-CM | POA: Diagnosis not present

## 2014-08-31 DIAGNOSIS — N2889 Other specified disorders of kidney and ureter: Secondary | ICD-10-CM | POA: Diagnosis not present

## 2014-08-31 DIAGNOSIS — G8929 Other chronic pain: Secondary | ICD-10-CM | POA: Insufficient documentation

## 2014-08-31 DIAGNOSIS — C9000 Multiple myeloma not having achieved remission: Secondary | ICD-10-CM | POA: Diagnosis present

## 2014-08-31 DIAGNOSIS — C9002 Multiple myeloma in relapse: Secondary | ICD-10-CM

## 2014-08-31 DIAGNOSIS — M549 Dorsalgia, unspecified: Secondary | ICD-10-CM | POA: Diagnosis not present

## 2014-08-31 DIAGNOSIS — I10 Essential (primary) hypertension: Secondary | ICD-10-CM | POA: Diagnosis not present

## 2014-08-31 LAB — BASIC METABOLIC PANEL
ANION GAP: 5 (ref 5–15)
BUN: 25 mg/dL — AB (ref 6–20)
CALCIUM: 8.2 mg/dL — AB (ref 8.9–10.3)
CO2: 28 mmol/L (ref 22–32)
Chloride: 102 mmol/L (ref 101–111)
Creatinine, Ser: 1.01 mg/dL (ref 0.61–1.24)
GFR calc Af Amer: >60 mL/min (ref >60)
Glucose, Bld: 92 mg/dL (ref 65–99)
Potassium: 4.2 mmol/L (ref 3.5–5.1)
SODIUM: 135 mmol/L (ref 135–145)

## 2014-09-01 LAB — PROTEIN ELECTROPHORESIS, SERUM
A/G Ratio: 1 (ref 0.7–1.7)
Albumin ELP: 3.1 g/dL (ref 2.9–4.4)
Alpha-1-Globulin: 0.3 g/dL (ref 0.0–0.4)
Alpha-2-Globulin: 0.8 g/dL (ref 0.4–1.0)
BETA GLOBULIN: 0.9 g/dL (ref 0.7–1.3)
GAMMA GLOBULIN: 1 g/dL (ref 0.4–1.8)
GLOBULIN, TOTAL: 3.1 g/dL (ref 2.2–3.9)
M-Spike, %: 0.6 g/dL — ABNORMAL HIGH
TOTAL PROTEIN ELP: 6.2 g/dL (ref 6.0–8.5)

## 2014-09-05 ENCOUNTER — Ambulatory Visit (INDEPENDENT_AMBULATORY_CARE_PROVIDER_SITE_OTHER): Payer: Commercial Managed Care - HMO | Admitting: Urology

## 2014-09-05 VITALS — BP 107/68 | HR 87 | Ht 70.0 in | Wt 157.0 lb

## 2014-09-05 DIAGNOSIS — C9000 Multiple myeloma not having achieved remission: Secondary | ICD-10-CM

## 2014-09-05 DIAGNOSIS — N2889 Other specified disorders of kidney and ureter: Secondary | ICD-10-CM | POA: Diagnosis not present

## 2014-09-05 DIAGNOSIS — Z8051 Family history of malignant neoplasm of kidney: Secondary | ICD-10-CM

## 2014-09-06 ENCOUNTER — Encounter: Payer: Self-pay | Admitting: Urology

## 2014-09-06 ENCOUNTER — Telehealth: Payer: Self-pay | Admitting: *Deleted

## 2014-09-06 MED ORDER — DIAZEPAM 2 MG PO TABS: 2.0000 mg | ORAL_TABLET | Freq: Every evening | ORAL | Status: DC | PRN

## 2014-09-06 NOTE — Telephone Encounter (Signed)
faxed

## 2014-09-07 ENCOUNTER — Inpatient Hospital Stay: Payer: Commercial Managed Care - HMO

## 2014-09-07 ENCOUNTER — Inpatient Hospital Stay (HOSPITAL_BASED_OUTPATIENT_CLINIC_OR_DEPARTMENT_OTHER): Payer: Commercial Managed Care - HMO | Admitting: Oncology

## 2014-09-07 VITALS — BP 116/77 | HR 80 | Temp 96.0°F | Resp 18

## 2014-09-07 VITALS — BP 117/75 | HR 75 | Temp 97.9°F | Resp 16 | Wt 161.5 lb

## 2014-09-07 DIAGNOSIS — N2889 Other specified disorders of kidney and ureter: Secondary | ICD-10-CM

## 2014-09-07 DIAGNOSIS — M549 Dorsalgia, unspecified: Secondary | ICD-10-CM

## 2014-09-07 DIAGNOSIS — R197 Diarrhea, unspecified: Secondary | ICD-10-CM

## 2014-09-07 DIAGNOSIS — C9000 Multiple myeloma not having achieved remission: Secondary | ICD-10-CM

## 2014-09-07 DIAGNOSIS — C9002 Multiple myeloma in relapse: Secondary | ICD-10-CM

## 2014-09-07 DIAGNOSIS — G8929 Other chronic pain: Secondary | ICD-10-CM

## 2014-09-07 DIAGNOSIS — Z79899 Other long term (current) drug therapy: Secondary | ICD-10-CM

## 2014-09-07 DIAGNOSIS — Z86718 Personal history of other venous thrombosis and embolism: Secondary | ICD-10-CM

## 2014-09-07 MED ORDER — SODIUM CHLORIDE 0.9 % IV SOLN
Freq: Once | INTRAVENOUS | Status: AC
  Administered 2014-09-07: 15:00:00 via INTRAVENOUS
  Filled 2014-09-07: qty 1000

## 2014-09-07 MED ORDER — ZOLEDRONIC ACID 4 MG/100ML IV SOLN
4.0000 mg | Freq: Once | INTRAVENOUS | Status: AC
  Administered 2014-09-07: 4 mg via INTRAVENOUS
  Filled 2014-09-07: qty 100

## 2014-09-07 MED ORDER — ZOLEDRONIC ACID 4 MG/5ML IV CONC: 4.0000 mg | Freq: Once | INTRAVENOUS | Status: DC

## 2014-09-10 ENCOUNTER — Encounter: Payer: Self-pay | Admitting: Urology

## 2014-09-10 NOTE — Progress Notes (Signed)
11:26 AM   Traeger Sultana Dimaggio 04/06/1945 [id number]  Referring provider: Glean Hess, MD [id number] [place], [place] [address]  Chief Complaint  Patient presents with  . Renal Mass  . 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 He initially presented to the emergency room at [facility] with shortness of breath and underwent workup including CT PA which showed incidental finding of a possible renal mass. He ended up being admitted to the hospital for pneumonia and has since been discharged.  Of note, the mass was seen on previous imaging back in 2012 described as 1.2 cm low attenuating renal lesion which was indeterminate on that particular scan button did not meet the criteria for simple cyst.  He returns today for follow-up after dedicated CT abdomen and the pelvis (renal mass protocol) demonstrates a 2.5 cm homogeneous exophytic lesion in the left upper pole of the kidney with low grade enhancement.   No evidence of vein thrombosis or obvious metastatic disease.  He denies any history of flank pain or gross hematuria. He denies any urinary complaints today other than getting up once nightly to void, and occasional difficulty starting and stopping his stream. He does also have a rare dribbling of urine if he can get to the bathroom on time but no frank incontinence.   He does report a possible family history of renal cell carcinoma in his mother. He thinks his mother may have passed away in her 75s from metastatic RCC but is not 100% sure.  She also had other malignancies including lung and liver cancer but is unclear from the history whether these were possibly metastatic lesions.  PMH: Past Medical History  Diagnosis Date  . DVT (deep  venous thrombosis)   . Hypertension   . COPD (chronic obstructive pulmonary disease)   . Diabetes mellitus without complication     Patient states he takes Glimeperide  . Plasmocytoma 2004  . Multiple myeloma     Surgical History: Past Surgical History  Procedure Laterality Date  . Limbal stem cell transplant      Multiple Myleoma  . Tonsillectomy    . 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 Cataract Right   . Ileostomy closure  2012    Home Medications:    Medication List       This list is accurate as of: 09/05/14 11:59 PM.  Always use your most recent med list.               albuterol 108 (90 BASE) MCG/ACT inhaler  Commonly known as:  PROVENTIL HFA;VENTOLIN HFA  Inhale 2 puffs into the lungs every 6 (six) hours as needed for wheezing or shortness of breath.     azelastine 0.1 % nasal spray  Commonly known as:  ASTELIN  Place 2 sprays into both nostrils 2 (two) times daily. Use in each nostril as directed     baclofen 10 MG tablet  Commonly known as:  LIORESAL  Take 10 mg by mouth 3 (three) times daily as needed for muscle spasms.     diazepam 2 MG tablet  Commonly known as:  VALIUM  Take 1 tablet (2 mg total) by mouth at bedtime as needed for  anxiety.     fluticasone 50 MCG/ACT nasal spray  Commonly known as:  FLONASE  Place 2 sprays into both nostrils daily as needed for rhinitis.     gabapentin 300 MG capsule  Commonly known as:  NEURONTIN  Take 300 mg by mouth 2 (two) times daily.     glimepiride 2 MG tablet  Commonly known as:  AMARYL  Take 2 mg by mouth daily with breakfast.     ibuprofen 800 MG tablet  Commonly known as:  ADVIL,MOTRIN  Take 800 mg by mouth every 8 (eight) hours as needed for moderate pain.     ipratropium-albuterol 0.5-2.5 (3) MG/3ML Soln  Commonly known as:  DUONEB  Take 3 mLs by nebulization every 6 (six) hours as needed.     levalbuterol 45 MCG/ACT inhaler  Commonly known as:  XOPENEX HFA  Inhale into  the lungs every 4 (four) hours as needed for wheezing.     Melatonin 5 MG Tabs  Take 1 tablet by mouth at bedtime as needed (for sleep).     morphine 15 MG 12 hr tablet  Commonly known as:  MS CONTIN  Take 1 tablet (15 mg total) by mouth every 8 (eight) hours.     NOVOLOG FLEXPEN 100 UNIT/ML FlexPen  Generic drug:  insulin aspart  Inject into the skin 3 (three) times daily with meals.     omeprazole 20 MG capsule  Commonly known as:  PRILOSEC  Take 20 mg by mouth daily as needed (for indigestion/heartburn).     prednisoLONE acetate 1 % ophthalmic suspension  Commonly known as:  PRED FORTE  Place 1 drop into the right eye 2 (two) times daily.     prochlorperazine 10 MG tablet  Commonly known as:  COMPAZINE  Take 10 mg by mouth every 6 (six) hours as needed for nausea or vomiting.     valACYclovir 500 MG tablet  Commonly known as:  VALTREX  Take 500 mg by mouth at bedtime.        Allergies: No Known Allergies  Family History: Family History  Problem Relation Age of Onset  . Diabetes Mother   . Cancer Mother     Social History:  reports that he has been smoking Cigarettes.  He has a 25 pack-year smoking history. He has never used smokeless tobacco. He reports that he does not drink alcohol or use illicit drugs.    Physical Exam: BP 107/68 mmHg  Pulse 87  Ht _0  (1.778 m)  Wt 157 lb (71.215 kg)  BMI 22.53 kg/m2  Constitutional:  Alert and oriented, No acute distress.   HEENT: [place] AT, moist mucus membranes.  Trachea midline, no masses. Cardiovascular: No clubbing, cyanosis, or edema. Respiratory: Normal respiratory effort, no increased work of breathing. GI: Abdomen is soft, nontender, nondistended, no abdominal masses GU: No CVA tenderness.  Skin: No rashes, bruises or suspicious lesions. Neurologic: Grossly intact, no focal deficits, moving all 4 extremities. Psychiatric: Normal mood and affect.  Laboratory Data: Lab Results  Component Value Date   WBC  4.4 08/15/2014   HGB 9.9* 08/15/2014   HCT 29.7* 08/15/2014   MCV 100.3* 08/15/2014   PLT 103* 08/15/2014    Lab Results  Component Value Date   CREATININE 1.01 08/31/2014    Lab Results  Component Value Date   PSA 1.5 11/11/2012    No results found for: TESTOSTERONE  Lab Results  Component Value Date   HGBA1C 6.0 12/27/2013    Pertinent  Imaging: CLINICAL DATA: Left renal mass seen on recent chest CT. Subsequent encounter.  EXAM: CT ABDOMEN AND PELVIS WITHOUT AND WITH CONTRAST  TECHNIQUE: Multidetector CT imaging of the abdomen and pelvis was performed following the standard protocol before and following the bolus administration of intravenous contrast.  CONTRAST: 165m OMNIPAQUE IOHEXOL 350 MG/ML SOLN  COMPARISON: CT chest from 08/02/2014. Abdomen and pelvis CT from 05/17/2010.  FINDINGS: Lower chest: Patchy right lower lobe airspace disease seen previously has improved in the interval, but there is new interstitial and alveolar opacities in the medial left lung base, suspicious for pneumonia. Small left pleural effusion is new in the interval.  Hepatobiliary: Scattered tiny hypodensities in the liver parenchyma are compatible with a cyst. 3 cm calcified gallstone is evident with associated gallbladder distention, but no evidence gallbladder wall thickening or pericholecystic fluid. Mild intrahepatic biliary duct prominence is evident. The common bile duct measures 7 mm in diameter at the level of the pancreatic head, upper normal for patient age.  Pancreas: There is no evidence for gallstones, gallbladder wall thickening, or pericholecystic fluid.  Spleen: No splenomegaly. No focal mass lesion.  Adrenals/Urinary Tract: No adrenal nodule or mass. Multiple tiny hypo attenuating lesions are seen in the kidneys bilaterally, many of which are too small to fully characterize. 3.0 x 2.5 cm exophytic homogeneous lesion is seen emanating from the  upper pole of the left kidney. This has an average attenuation of about 46 Hounsfield units on precontrast imaging and increases to 67 Hounsfield units on postcontrast imaging. The lesion remains relatively homogeneous after IV contrast administration. No evidence for hydronephrosis. Left upper pole renal lesion does not extend into the central sinus fat. The left renal vein courses anterior to the aorta and is patent without filling defect.  No evidence for hydroureter. Urinary bladder is unremarkable.  Stomach/Bowel: Stomach is nondistended. No gastric wall thickening. No evidence of outlet obstruction. Duodenum is normally positioned as is the ligament of Treitz. No small bowel wall thickening. No small bowel dilatation. Terminal ileum is normal. The appendix is normal. Small bowel anastomosis is seen in the central pelvis. There is anastomotic suture line in the distal sigmoid colon.  Vascular/Lymphatic: There is abdominal aortic atherosclerosis without aneurysm. No abdominal lymphadenopathy. No evidence for pelvic sidewall lymphadenopathy.  Reproductive: Prostate gland and seminal vesicles are unremarkable.  Other: No intraperitoneal free fluid.  Musculoskeletal: Soft tissue replacement is seen bilaterally in the lower bony pelvis with destruction of the right inferior pubic ramus. This is compatible with multiple myeloma and was evaluated by MRI on 03/14/2014. Bony involvement is seen and multiple vertebral bodies and there is near complete collapse of L3 with superior endplate compression deformity visible at T12, L2, and L5.  IMPRESSION: 1. 2.5 cm homogeneous exophytic lesion in the upper pole the left kidney shows low level homogeneous enhancement after IV contrast administration. This lesion has increased in size from 1.2 cm on the study from 4 years ago. As such, imaging features are concerning for neoplasm and papillary renal cell carcinoma would be  a consideration. 2. Interval development of patchy airspace disease in the left lower lobe, suspicious for pneumonia. Previous is seen alveolar disease in the right lower lobe has improved slightly in the interval. 3. Bony changes consistent with the reported history of multiple myeloma.   Electronically Signed  By: EMisty StanleyM.D.  On: 08/16/2014 14:15  Assessment & Plan:  69year old male with multiple comorbidities including relapsing multiple myeloma currently on chemotherapy status post palate  of radiation who presents today for incidental 2.8 cm left upper pole renal mass discovered on CTPA.   Dedicated renal mass protocol CT shows a mildly enhancing 2.5 cm left renal mass is increased from 1.2 cm 4 years ago.  We discussed again today that this mass has an approximately 80% chance of representing a malignancy. In general, lesions under 3-4 cm have a low risk of metastasis and renal masses tenderness slow growing.  Options including biopsy, biopsies cryo-, and partial nephrectomy were discussed again today.  Given his medical comorbidities, he would like to defer any treatment or intervention at this time. He is very realistic about his life expectancy and may not choose to treat any further relapses of multiple myeloma in the future.  As such, he would like to continue to follow this mass.  1. Left kidney mass Recommend repeat CT abdomen and pelvis in 6 months to reevaluate.  Patient advised he may change his mind about proceeding with any intervention at any point, he will let us know.  He was offered a consult today with interventional radiology but declined. - Urinalysis, Complete - CT Abdomen Pelvis W Wo Contrast; Future  2. Multiple myeloma Relapsing multiple myeloma, prognosis uncertain.    3. Family history of kidney cancer Questionable family history of metastatic RCC and mother.   Return in about 6 months (around 03/08/2015) for f/u CT scan abd.  Hollice Espy,  MD  [facility] [address] [place], [place] [address] [phone]

## 2014-09-13 NOTE — Progress Notes (Signed)
[place]  [phone] [phone]) [phone]  ID: Nanetta Batty OB: [birth year]  MR#: [id number]  UMP#:[id number]  Patient Care Team: Glean Hess, MD as PCP - General (Family Medicine) Algernon Huxley, MD as Referring Physician (Vascular Surgery)  CHIEF COMPLAINT:  Multiple myeloma.  INTERVAL HISTORY: Patient returns to clinic today for laboratory work, further evaluation, and continuation of Zometa.  He continues to have weakness and fatigue, despite discontinuing Revlimid.  He has now completed his XRT and his pain is significantly better controlled. He denies any abdominal pain.  He denies any chest pain or shortness of breath.  He denies any fevers, chills, or night sweats.  He denies any nausea, vomiting, constipation, or diarrhea.  He denies any easy bleeding or bruising.  Patient offers no further specific complaints today.  REVIEW OF SYSTEMS:   Review of Systems  Constitutional: Positive for malaise/fatigue.  Respiratory: Negative.   Cardiovascular: Negative.   Musculoskeletal: Negative.   Neurological: Positive for weakness.    As per HPI. Otherwise, a complete review of systems is negatve.  PAST MEDICAL HISTORY: Past Medical History  Diagnosis Date  . DVT (deep venous thrombosis)   . Hypertension   . COPD (chronic obstructive pulmonary disease)   . Diabetes mellitus without complication     Patient states he takes Glimeperide  . Plasmocytoma 2004  . Multiple myeloma     PAST SURGICAL HISTORY: Past Surgical History  Procedure Laterality Date  . Limbal stem cell transplant      Multiple Myleoma  . Tonsillectomy    . 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 Cataract Right   . Ileostomy closure  2012    FAMILY HISTORY:  Unchanged. No report of malignancy or other chronic disease.     ADVANCED DIRECTIVES:    HEALTH MAINTENANCE: History  Substance Use Topics  . Smoking status: Current Every Day Smoker  -- 0.50 packs/day for 50 years    Types: Cigarettes  . Smokeless tobacco: Never Used  . Alcohol Use: No     Colonoscopy:  PAP:  Bone density:  Lipid panel:  No Known Allergies  Current Outpatient Prescriptions  Medication Sig Dispense Refill  . albuterol (PROVENTIL HFA;VENTOLIN HFA) 108 (90 BASE) MCG/ACT inhaler Inhale 2 puffs into the lungs every 6 (six) hours as needed for wheezing or shortness of breath. 3 Inhaler 3  . azelastine (ASTELIN) 0.1 % nasal spray Place 2 sprays into both nostrils 2 (two) times daily. Use in each nostril as directed 30 mL 0  . baclofen (LIORESAL) 10 MG tablet Take 10 mg by mouth 3 (three) times daily as needed for muscle spasms.    . diazepam (VALIUM) 2 MG tablet Take 1 tablet (2 mg total) by mouth at bedtime as needed for anxiety. 30 tablet 1  . fluticasone (FLONASE) 50 MCG/ACT nasal spray Place 2 sprays into both nostrils daily as needed for rhinitis.    Marland Kitchen gabapentin (NEURONTIN) 300 MG capsule Take 300 mg by mouth 2 (two) times daily.    Marland Kitchen glimepiride (AMARYL) 2 MG tablet Take 2 mg by mouth daily with breakfast.    . ibuprofen (ADVIL,MOTRIN) 800 MG tablet Take 800 mg by mouth every 8 (eight) hours as needed for moderate pain.     Marland Kitchen insulin aspart (NOVOLOG FLEXPEN) 100 UNIT/ML FlexPen Inject into the skin 3 (three) times daily with meals.    Marland Kitchen ipratropium-albuterol (DUONEB) 0.5-2.5 (3) MG/3ML SOLN Take 3 mLs  by nebulization every 6 (six) hours as needed. 360 mL 3  . levalbuterol (XOPENEX HFA) 45 MCG/ACT inhaler Inhale into the lungs every 4 (four) hours as needed for wheezing.    . Melatonin 5 MG TABS Take 1 tablet by mouth at bedtime as needed (for sleep).    . morphine (MS CONTIN) 15 MG 12 hr tablet Take 1 tablet (15 mg total) by mouth every 8 (eight) hours. 90 tablet 0  . omeprazole (PRILOSEC) 20 MG capsule Take 20 mg by mouth daily as needed (for indigestion/heartburn).     . prednisoLONE acetate (PRED FORTE) 1 % ophthalmic suspension Place 1 drop into  the right eye 2 (two) times daily.    . prochlorperazine (COMPAZINE) 10 MG tablet Take 10 mg by mouth every 6 (six) hours as needed for nausea or vomiting.    . valACYclovir (VALTREX) 500 MG tablet Take 500 mg by mouth at bedtime.     No current facility-administered medications for this visit.    OBJECTIVE: Filed Vitals:   09/07/14 1423  BP: 117/75  Pulse: 75  Temp: 97.9 F (36.6 C)  Resp: 16     Body mass index is 23.17 kg/(m^2).    ECOG FS:0 - Asymptomatic  General: Well-developed, well-nourished, no acute distress. Eyes: anicteric sclera. Lungs: Clear to auscultation bilaterally. Heart: Regular rate and rhythm. No rubs, murmurs, or gallops. Abdomen: Soft, nontender, nondistended. No organomegaly noted, normoactive bowel sounds. Musculoskeletal: No edema, cyanosis, or clubbing. Neuro: Alert, answering all questions appropriately. Cranial nerves grossly intact. Skin: No rashes or petechiae noted. Psych: Normal affect.    LAB RESULTS:  Lab Results  Component Value Date   NA 135 08/31/2014   K 4.2 08/31/2014   CL 102 08/31/2014   CO2 28 08/31/2014   GLUCOSE 92 08/31/2014   BUN 25* 08/31/2014   CREATININE 1.01 08/31/2014   CALCIUM 8.2* 08/31/2014   PROT 5.9* 08/15/2014   ALBUMIN 2.9* 08/15/2014   AST 27 08/15/2014   ALT 24 08/15/2014   ALKPHOS 83 08/15/2014   BILITOT 0.7 08/15/2014   GFRNONAA >60 08/31/2014   GFRAA >60 08/31/2014    Lab Results  Component Value Date   WBC 4.4 08/15/2014   NEUTROABS 3.1 08/15/2014   HGB 9.9* 08/15/2014   HCT 29.7* 08/15/2014   MCV 100.3* 08/15/2014   PLT 103* 08/15/2014     STUDIES: Ct Abdomen Pelvis W Wo Contrast  08/16/2014   CLINICAL DATA:  Left renal mass seen on recent chest CT. Subsequent encounter.  EXAM: CT ABDOMEN AND PELVIS WITHOUT AND WITH CONTRAST  TECHNIQUE: Multidetector CT imaging of the abdomen and pelvis was performed following the standard protocol before and following the bolus administration of  intravenous contrast.  CONTRAST:  135m OMNIPAQUE IOHEXOL 350 MG/ML SOLN  COMPARISON:  CT chest from 08/02/2014. Abdomen and pelvis CT from 05/17/2010.  FINDINGS: Lower chest: Patchy right lower lobe airspace disease seen previously has improved in the interval, but there is new interstitial and alveolar opacities in the medial left lung base, suspicious for pneumonia. Small left pleural effusion is new in the interval.  Hepatobiliary: Scattered tiny hypodensities in the liver parenchyma are compatible with a cyst. 3 cm calcified gallstone is evident with associated gallbladder distention, but no evidence gallbladder wall thickening or pericholecystic fluid. Mild intrahepatic biliary duct prominence is evident. The common bile duct measures 7 mm in diameter at the level of the pancreatic head, upper normal for patient age.  Pancreas: There is no evidence for  gallstones, gallbladder wall thickening, or pericholecystic fluid.  Spleen: No splenomegaly. No focal mass lesion.  Adrenals/Urinary Tract: No adrenal nodule or mass. Multiple tiny hypo attenuating lesions are seen in the kidneys bilaterally, many of which are too small to fully characterize. 3.0 x 2.5 cm exophytic homogeneous lesion is seen emanating from the upper pole of the left kidney. This has an average attenuation of about 46 Hounsfield units on precontrast imaging and increases to 67 Hounsfield units on postcontrast imaging. The lesion remains relatively homogeneous after IV contrast administration. No evidence for hydronephrosis. Left upper pole renal lesion does not extend into the central sinus fat. The left renal vein courses anterior to the aorta and is patent without filling defect.  No evidence for hydroureter.  Urinary bladder is unremarkable.  Stomach/Bowel: Stomach is nondistended. No gastric wall thickening. No evidence of outlet obstruction. Duodenum is normally positioned as is the ligament of Treitz. No small bowel wall thickening. No  small bowel dilatation. Terminal ileum is normal. The appendix is normal. Small bowel anastomosis is seen in the central pelvis. There is anastomotic suture line in the distal sigmoid colon.  Vascular/Lymphatic: There is abdominal aortic atherosclerosis without aneurysm. No abdominal lymphadenopathy. No evidence for pelvic sidewall lymphadenopathy.  Reproductive: Prostate gland and seminal vesicles are unremarkable.  Other: No intraperitoneal free fluid.  Musculoskeletal: Soft tissue replacement is seen bilaterally in the lower bony pelvis with destruction of the right inferior pubic ramus. This is compatible with multiple myeloma and was evaluated by MRI on 03/14/2014. Bony involvement is seen and multiple vertebral bodies and there is near complete collapse of L3 with superior endplate compression deformity visible at T12, L2, and L5.  IMPRESSION: 1. 2.5 cm homogeneous exophytic lesion in the upper pole the left kidney shows low level homogeneous enhancement after IV contrast administration. This lesion has increased in size from 1.2 cm on the study from 4 years ago. As such, imaging features are concerning for neoplasm and papillary renal cell carcinoma would be a consideration. 2. Interval development of patchy airspace disease in the left lower lobe, suspicious for pneumonia. Previous is seen alveolar disease in the right lower lobe has improved slightly in the interval. 3. Bony changes consistent with the reported history of multiple myeloma.   Electronically Signed   By: Misty Stanley M.D.   On: 08/16/2014 14:15   Dg Chest 2 View  08/15/2014   CLINICAL DATA:  New onset fever, recent completion of chemotherapy for multiple myeloma.  EXAM: CHEST  2 VIEW  COMPARISON:  Chest radiograph August 02, 2014 and CT chest August 02, 2014  FINDINGS: Cardiac silhouette is unremarkable, mildly calcified aortic knob. Increased lung volumes with flattened hemidiaphragms, mild chronic interstitial changes consistent with COPD.  No pneumothorax. Osteopenia with multiple old anterior rib fractures. Expansile lytic RIGHT scapula.  IMPRESSION: COPD without superimposed acute cardiopulmonary process.  Osteopenia with expansile lytic lesion RIGHT scapula consistent with history of multiple myeloma.   Electronically Signed   By: Elon Alas M.D.   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  PLAN:    1.  Multiple myeloma: Continue to hold Revlimid at this time. His M spike has plateaued and is now 0.6. Continue 81 mg aspirin daily. Proceed with Zometa today. Return to clinic in 3 weeks for laboratory work and then in 4 weeks for continuation of Zometa. 2.  History of DVT/PE: He  is no longer taking Coumadin. Patient has reinitiated 81 mg aspirin. 3   Pain: Continue current regimen. XRT has completed. 4.  Hypokalemia: Resolved, continue to monitor.   5.  Diarrhea: Patient has been instructed to use OTC Imodium as needed. 6.  Back pain: Chronic.  Continue ibuprofen as needed.  7.  Kidney mass: Highly suspicious for underlying malignancy.  Patient had evaluation by urology and opted for surveillance with repeat CT scan in 6 months.   Patient expressed understanding and was in agreement with this plan. He also understands that He can call clinic at any time with any questions, concerns, or complaints.    Lloyd Huger, MD   09/13/2014 2:50 PM

## 2014-09-25 ENCOUNTER — Other Ambulatory Visit: Payer: Self-pay | Admitting: *Deleted

## 2014-09-26 MED ORDER — BACLOFEN 10 MG PO TABS: 10.0000 mg | ORAL_TABLET | Freq: Three times a day (TID) | ORAL | Status: DC | PRN

## 2014-09-28 ENCOUNTER — Other Ambulatory Visit: Payer: Commercial Managed Care - HMO

## 2014-10-05 ENCOUNTER — Other Ambulatory Visit: Payer: Self-pay | Admitting: *Deleted

## 2014-10-05 NOTE — Telephone Encounter (Signed)
Requesting a 90 supply of his baclofen which would be 270 tablets

## 2014-10-06 MED ORDER — BACLOFEN 10 MG PO TABS: 10.0000 mg | ORAL_TABLET | Freq: Three times a day (TID) | ORAL | Status: DC | PRN

## 2014-10-06 NOTE — Telephone Encounter (Signed)
90 day supply approved per Dr. Grayland Ormond.

## 2014-10-09 ENCOUNTER — Inpatient Hospital Stay: Payer: Commercial Managed Care - HMO

## 2014-10-09 ENCOUNTER — Inpatient Hospital Stay: Payer: Commercial Managed Care - HMO | Attending: Oncology | Admitting: Oncology

## 2014-10-09 ENCOUNTER — Other Ambulatory Visit: Payer: Self-pay | Admitting: *Deleted

## 2014-10-09 VITALS — BP 137/74 | HR 66 | Temp 96.9°F | Resp 20 | Wt 161.6 lb

## 2014-10-09 DIAGNOSIS — C9030 Solitary plasmacytoma not having achieved remission: Secondary | ICD-10-CM

## 2014-10-09 DIAGNOSIS — I10 Essential (primary) hypertension: Secondary | ICD-10-CM | POA: Diagnosis not present

## 2014-10-09 DIAGNOSIS — J449 Chronic obstructive pulmonary disease, unspecified: Secondary | ICD-10-CM | POA: Insufficient documentation

## 2014-10-09 DIAGNOSIS — G8929 Other chronic pain: Secondary | ICD-10-CM | POA: Insufficient documentation

## 2014-10-09 DIAGNOSIS — E876 Hypokalemia: Secondary | ICD-10-CM | POA: Insufficient documentation

## 2014-10-09 DIAGNOSIS — E119 Type 2 diabetes mellitus without complications: Secondary | ICD-10-CM | POA: Insufficient documentation

## 2014-10-09 DIAGNOSIS — N2889 Other specified disorders of kidney and ureter: Secondary | ICD-10-CM | POA: Diagnosis not present

## 2014-10-09 DIAGNOSIS — Z86711 Personal history of pulmonary embolism: Secondary | ICD-10-CM | POA: Diagnosis not present

## 2014-10-09 DIAGNOSIS — F17210 Nicotine dependence, cigarettes, uncomplicated: Secondary | ICD-10-CM | POA: Diagnosis not present

## 2014-10-09 DIAGNOSIS — Z794 Long term (current) use of insulin: Secondary | ICD-10-CM | POA: Diagnosis not present

## 2014-10-09 DIAGNOSIS — Z86718 Personal history of other venous thrombosis and embolism: Secondary | ICD-10-CM | POA: Insufficient documentation

## 2014-10-09 DIAGNOSIS — M25511 Pain in right shoulder: Secondary | ICD-10-CM

## 2014-10-09 DIAGNOSIS — M549 Dorsalgia, unspecified: Secondary | ICD-10-CM | POA: Diagnosis not present

## 2014-10-09 DIAGNOSIS — Z79899 Other long term (current) drug therapy: Secondary | ICD-10-CM

## 2014-10-09 DIAGNOSIS — C9002 Multiple myeloma in relapse: Secondary | ICD-10-CM

## 2014-10-09 DIAGNOSIS — R197 Diarrhea, unspecified: Secondary | ICD-10-CM | POA: Insufficient documentation

## 2014-10-09 DIAGNOSIS — C9000 Multiple myeloma not having achieved remission: Secondary | ICD-10-CM

## 2014-10-09 LAB — CBC
HEMATOCRIT: 34.6 % — AB (ref 40.0–52.0)
HEMOGLOBIN: 11.6 g/dL — AB (ref 13.0–18.0)
MCH: 33.2 pg (ref 26.0–34.0)
MCHC: 33.5 g/dL (ref 32.0–36.0)
MCV: 99.1 fL (ref 80.0–100.0)
Platelets: 154 10*3/uL (ref 150–440)
RBC: 3.5 MIL/uL — AB (ref 4.40–5.90)
RDW: 15.9 % — ABNORMAL HIGH (ref 11.5–14.5)
WBC: 5 10*3/uL (ref 3.8–10.6)

## 2014-10-09 LAB — BASIC METABOLIC PANEL
ANION GAP: 4 — AB (ref 5–15)
BUN: 24 mg/dL — ABNORMAL HIGH (ref 6–20)
CHLORIDE: 107 mmol/L (ref 101–111)
CO2: 29 mmol/L (ref 22–32)
Calcium: 8.4 mg/dL — ABNORMAL LOW (ref 8.9–10.3)
Creatinine, Ser: 0.87 mg/dL (ref 0.61–1.24)
GFR calc Af Amer: >60 mL/min (ref >60)
GFR calc non Af Amer: >60 mL/min (ref >60)
Glucose, Bld: 142 mg/dL — ABNORMAL HIGH (ref 65–99)
Potassium: 4.2 mmol/L (ref 3.5–5.1)
Sodium: 140 mmol/L (ref 135–145)

## 2014-10-09 MED ORDER — ALTEPLASE 2 MG IJ SOLR: 2.0000 mg | Freq: Once | INTRAMUSCULAR | Status: DC | PRN

## 2014-10-09 MED ORDER — MORPHINE SULFATE ER 15 MG PO TBCR
15.0000 mg | EXTENDED_RELEASE_TABLET | Freq: Three times a day (TID) | ORAL | Status: DC
Start: 2014-10-09 — End: 2014-11-08

## 2014-10-09 MED ORDER — SODIUM CHLORIDE 0.9 % IJ SOLN
3.0000 mL | Freq: Once | INTRAMUSCULAR | Status: DC | PRN
  Filled 2014-10-09: qty 10

## 2014-10-09 MED ORDER — SODIUM CHLORIDE 0.9 % IJ SOLN
10.0000 mL | INTRAMUSCULAR | Status: DC | PRN
  Administered 2014-10-09: 10 mL
  Filled 2014-10-09: qty 10

## 2014-10-09 MED ORDER — HEPARIN SOD (PORK) LOCK FLUSH 100 UNIT/ML IV SOLN: 250.0000 [IU] | Freq: Once | INTRAVENOUS | Status: DC | PRN

## 2014-10-09 MED ORDER — ZOLEDRONIC ACID 4 MG/100ML IV SOLN
4.0000 mg | Freq: Once | INTRAVENOUS | Status: AC
  Administered 2014-10-09: 4 mg via INTRAVENOUS
  Filled 2014-10-09: qty 100

## 2014-10-09 MED ORDER — LIDOCAINE-PRILOCAINE 2.5-2.5 % EX CREA: 1.0000 "application " | TOPICAL_CREAM | CUTANEOUS | Status: AC | PRN

## 2014-10-09 MED ORDER — HEPARIN SOD (PORK) LOCK FLUSH 100 UNIT/ML IV SOLN
500.0000 [IU] | Freq: Once | INTRAVENOUS | Status: AC | PRN
  Administered 2014-10-09: 500 [IU]
  Filled 2014-10-09: qty 5

## 2014-10-09 NOTE — Progress Notes (Signed)
Patient here today for ongoing follow up and treatment consideration regarding myeloma. Patient reports pain to right shoulder.

## 2014-10-10 LAB — PROTEIN ELECTROPHORESIS, SERUM
A/G RATIO SPE: 1.2 (ref 0.7–1.7)
ALBUMIN ELP: 3.1 g/dL (ref 2.9–4.4)
ALPHA-1-GLOBULIN: 0.3 g/dL (ref 0.0–0.4)
ALPHA-2-GLOBULIN: 0.6 g/dL (ref 0.4–1.0)
BETA GLOBULIN: 0.8 g/dL (ref 0.7–1.3)
Gamma Globulin: 0.9 g/dL (ref 0.4–1.8)
Globulin, Total: 2.6 g/dL (ref 2.2–3.9)
M-Spike, %: 0.5 g/dL — ABNORMAL HIGH
Total Protein ELP: 5.7 g/dL — ABNORMAL LOW (ref 6.0–8.5)

## 2014-10-14 NOTE — Progress Notes (Signed)
[place]  Telephone:(336) [phone] [phone]) [phone]  ID: Nanetta Batty OB: 01-04-1946  MR#: [id number]  YHC#:[id number]  Patient Care Team: Glean Hess, MD as PCP - General (Family Medicine) Algernon Huxley, MD as Referring Physician (Vascular Surgery)  CHIEF COMPLAINT:  Multiple myeloma.  INTERVAL HISTORY: Patient returns to clinic today for laboratory work, further evaluation, and continuation of Zometa.  He continues to have weakness and fatigue, but states it is improved.  He has worsening right shoulder pain.  He has now completed his XRT and his pain is significantly better controlled. He denies any abdominal pain.  He denies any chest pain or shortness of breath.  He denies any fevers, chills, or night sweats.  He denies any nausea, vomiting, constipation, or diarrhea.  He denies any easy bleeding or bruising.  Patient offers no further specific complaints today.  REVIEW OF SYSTEMS:   Review of Systems  Constitutional: Positive for malaise/fatigue.  Respiratory: Negative.   Cardiovascular: Negative.   Musculoskeletal: Positive for back pain and joint pain.  Neurological: Positive for weakness.    As per HPI. Otherwise, a complete review of systems is negatve.  PAST MEDICAL HISTORY: Past Medical History  Diagnosis Date  . DVT (deep venous thrombosis)   . Hypertension   . COPD (chronic obstructive pulmonary disease)   . Diabetes mellitus without complication     Patient states he takes Glimeperide  . Plasmocytoma 2004  . Multiple myeloma     PAST SURGICAL HISTORY: Past Surgical History  Procedure Laterality Date  . Limbal stem cell transplant      Multiple Myleoma  . Tonsillectomy    . 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 Cataract Right   . Ileostomy closure  2012    FAMILY HISTORY:  Unchanged. No report of malignancy or other chronic disease.     ADVANCED DIRECTIVES:    HEALTH MAINTENANCE: Social History   Substance Use Topics  . Smoking status: Current Every Day Smoker -- 0.50 packs/day for 50 years    Types: Cigarettes  . Smokeless tobacco: Never Used  . Alcohol Use: No     Colonoscopy:  PAP:  Bone density:  Lipid panel:  No Known Allergies  Current Outpatient Prescriptions  Medication Sig Dispense Refill  . albuterol (PROVENTIL HFA;VENTOLIN HFA) 108 (90 BASE) MCG/ACT inhaler Inhale 2 puffs into the lungs every 6 (six) hours as needed for wheezing or shortness of breath. 3 Inhaler 3  . azelastine (ASTELIN) 0.1 % nasal spray Place 2 sprays into both nostrils 2 (two) times daily. Use in each nostril as directed 30 mL 0  . baclofen (LIORESAL) 10 MG tablet Take 1 tablet (10 mg total) by mouth 3 (three) times daily as needed for muscle spasms. 270 each 1  . diazepam (VALIUM) 2 MG tablet Take 1 tablet (2 mg total) by mouth at bedtime as needed for anxiety. 30 tablet 1  . fluticasone (FLONASE) 50 MCG/ACT nasal spray Place 2 sprays into both nostrils daily as needed for rhinitis.    Marland Kitchen gabapentin (NEURONTIN) 300 MG capsule Take 300 mg by mouth 2 (two) times daily.    Marland Kitchen glimepiride (AMARYL) 2 MG tablet Take 2 mg by mouth daily with breakfast.    . ibuprofen (ADVIL,MOTRIN) 800 MG tablet Take 800 mg by mouth every 8 (eight) hours as needed for moderate pain.     Marland Kitchen insulin aspart (NOVOLOG FLEXPEN) 100 UNIT/ML FlexPen Inject into the skin  3 (three) times daily with meals.    Marland Kitchen ipratropium-albuterol (DUONEB) 0.5-2.5 (3) MG/3ML SOLN Take 3 mLs by nebulization every 6 (six) hours as needed. 360 mL 3  . levalbuterol (XOPENEX HFA) 45 MCG/ACT inhaler Inhale into the lungs every 4 (four) hours as needed for wheezing.    . Melatonin 5 MG TABS Take 1 tablet by mouth at bedtime as needed (for sleep).    . morphine (MS CONTIN) 15 MG 12 hr tablet Take 1 tablet (15 mg total) by mouth every 8 (eight) hours. 90 tablet 0  . omeprazole (PRILOSEC) 20 MG capsule Take 20 mg by mouth daily as needed (for  indigestion/heartburn).     . prednisoLONE acetate (PRED FORTE) 1 % ophthalmic suspension Place 1 drop into the right eye 2 (two) times daily.    . prochlorperazine (COMPAZINE) 10 MG tablet Take 10 mg by mouth every 6 (six) hours as needed for nausea or vomiting.    . valACYclovir (VALTREX) 500 MG tablet Take 500 mg by mouth at bedtime.    . lidocaine-prilocaine (EMLA) cream Apply 1 application topically as needed. Apply to port then cover with saran wrap 1-2 hours before appointment 30 g 2   No current facility-administered medications for this visit.    OBJECTIVE: Filed Vitals:   10/09/14 1439  BP: 137/74  Pulse: 66  Temp: 96.9 F (36.1 C)  Resp: 20     Body mass index is 23.19 kg/(m^2).    ECOG FS:0 - Asymptomatic  General: Well-developed, well-nourished, no acute distress. Eyes: anicteric sclera. Lungs: Clear to auscultation bilaterally. Heart: Regular rate and rhythm. No rubs, murmurs, or gallops. Abdomen: Soft, nontender, nondistended. No organomegaly noted, normoactive bowel sounds. Musculoskeletal: No edema, cyanosis, or clubbing. Neuro: Alert, answering all questions appropriately. Cranial nerves grossly intact. Skin: No rashes or petechiae noted. Psych: Normal affect.    LAB RESULTS:  Lab Results  Component Value Date   NA 140 10/09/2014   K 4.2 10/09/2014   CL 107 10/09/2014   CO2 29 10/09/2014   GLUCOSE 142* 10/09/2014   BUN 24* 10/09/2014   CREATININE 0.87 10/09/2014   CALCIUM 8.4* 10/09/2014   PROT 5.9* 08/15/2014   ALBUMIN 2.9* 08/15/2014   AST 27 08/15/2014   ALT 24 08/15/2014   ALKPHOS 83 08/15/2014   BILITOT 0.7 08/15/2014   GFRNONAA >60 10/09/2014   GFRAA >60 10/09/2014    Lab Results  Component Value Date   WBC 5.0 10/09/2014   NEUTROABS 3.1 08/15/2014   HGB 11.6* 10/09/2014   HCT 34.6* 10/09/2014   MCV 99.1 10/09/2014   PLT 154 10/09/2014     STUDIES: No results found.  ASSESSMENT: Multiple myeloma, autologous bone marrow  transplant in April 2006 at [facility] in [place], [place].  PLAN:    1.  Multiple myeloma: Continue to hold Revlimid at this time. His M spike has plateaued and is now 0.5. Continue 81 mg aspirin daily. Proceed with Zometa today. Patient now has a port, therefore return to clinic in 4 weeks for laboratory work and continuation of Zometa.  2.  History of DVT/PE: He is no longer taking Coumadin. Patient has reinitiated 81 mg aspirin. 3   Pain: Continue current regimen. XRT has completed. 4.  Hypokalemia: Resolved, continue to monitor.   5.  Diarrhea: Patient has been instructed to use OTC Imodium as needed. 6.  Back pain: Chronic.  Continue ibuprofen as needed.  7.  Kidney mass: Highly suspicious for underlying malignancy.  Patient  had evaluation by urology and opted for surveillance with repeat CT scan in 6 months. Repeat CT scan in January 2017. 8. Right shoulder pain: Will get MRI to further evaluate.  Patient expressed understanding and was in agreement with this plan. He also understands that He can call clinic at any time with any questions, concerns, or complaints.    Lloyd Huger, MD   10/14/2014 3:25 PM

## 2014-10-20 ENCOUNTER — Ambulatory Visit
Admission: RE | Admit: 2014-10-20 | Discharge: 2014-10-20 | Disposition: A | Discharge status: Home / Self Care | Payer: Commercial Managed Care - HMO | Source: Ambulatory Visit | Attending: Oncology | Admitting: Oncology

## 2014-10-20 DIAGNOSIS — M75101 Unspecified rotator cuff tear or rupture of right shoulder, not specified as traumatic: Secondary | ICD-10-CM | POA: Insufficient documentation

## 2014-10-20 DIAGNOSIS — C9000 Multiple myeloma not having achieved remission: Secondary | ICD-10-CM | POA: Insufficient documentation

## 2014-10-20 DIAGNOSIS — M7551 Bursitis of right shoulder: Secondary | ICD-10-CM | POA: Diagnosis not present

## 2014-10-20 DIAGNOSIS — R52 Pain, unspecified: Secondary | ICD-10-CM | POA: Diagnosis present

## 2014-10-20 MED ORDER — GADOBENATE DIMEGLUMINE 529 MG/ML IV SOLN
15.0000 mL | Freq: Once | INTRAVENOUS | Status: AC | PRN
  Administered 2014-10-20: 15 mL via INTRAVENOUS

## 2014-10-24 ENCOUNTER — Encounter: Payer: Self-pay | Admitting: Urology

## 2014-11-06 ENCOUNTER — Inpatient Hospital Stay: Payer: Commercial Managed Care - HMO

## 2014-11-06 ENCOUNTER — Inpatient Hospital Stay (HOSPITAL_BASED_OUTPATIENT_CLINIC_OR_DEPARTMENT_OTHER): Payer: Commercial Managed Care - HMO | Admitting: Oncology

## 2014-11-06 ENCOUNTER — Inpatient Hospital Stay: Payer: Commercial Managed Care - HMO | Attending: Oncology

## 2014-11-06 VITALS — BP 124/76 | HR 83 | Temp 96.1°F | Resp 20 | Wt 162.0 lb

## 2014-11-06 DIAGNOSIS — E119 Type 2 diabetes mellitus without complications: Secondary | ICD-10-CM | POA: Diagnosis not present

## 2014-11-06 DIAGNOSIS — M549 Dorsalgia, unspecified: Secondary | ICD-10-CM | POA: Insufficient documentation

## 2014-11-06 DIAGNOSIS — C9000 Multiple myeloma not having achieved remission: Secondary | ICD-10-CM

## 2014-11-06 DIAGNOSIS — E876 Hypokalemia: Secondary | ICD-10-CM

## 2014-11-06 DIAGNOSIS — Z79899 Other long term (current) drug therapy: Secondary | ICD-10-CM | POA: Diagnosis not present

## 2014-11-06 DIAGNOSIS — N2889 Other specified disorders of kidney and ureter: Secondary | ICD-10-CM | POA: Diagnosis not present

## 2014-11-06 DIAGNOSIS — F17200 Nicotine dependence, unspecified, uncomplicated: Secondary | ICD-10-CM | POA: Insufficient documentation

## 2014-11-06 DIAGNOSIS — C9030 Solitary plasmacytoma not having achieved remission: Secondary | ICD-10-CM

## 2014-11-06 DIAGNOSIS — Z86711 Personal history of pulmonary embolism: Secondary | ICD-10-CM | POA: Insufficient documentation

## 2014-11-06 DIAGNOSIS — Z794 Long term (current) use of insulin: Secondary | ICD-10-CM | POA: Diagnosis not present

## 2014-11-06 DIAGNOSIS — Z86718 Personal history of other venous thrombosis and embolism: Secondary | ICD-10-CM | POA: Diagnosis not present

## 2014-11-06 DIAGNOSIS — M25511 Pain in right shoulder: Secondary | ICD-10-CM | POA: Diagnosis not present

## 2014-11-06 DIAGNOSIS — J449 Chronic obstructive pulmonary disease, unspecified: Secondary | ICD-10-CM | POA: Insufficient documentation

## 2014-11-06 DIAGNOSIS — G8929 Other chronic pain: Secondary | ICD-10-CM

## 2014-11-06 DIAGNOSIS — I10 Essential (primary) hypertension: Secondary | ICD-10-CM | POA: Diagnosis not present

## 2014-11-06 DIAGNOSIS — C9002 Multiple myeloma in relapse: Secondary | ICD-10-CM

## 2014-11-06 DIAGNOSIS — C9001 Multiple myeloma in remission: Secondary | ICD-10-CM

## 2014-11-06 LAB — BASIC METABOLIC PANEL
ANION GAP: 3 — AB (ref 5–15)
BUN: 24 mg/dL — ABNORMAL HIGH (ref 6–20)
CALCIUM: 9 mg/dL (ref 8.9–10.3)
CO2: 31 mmol/L (ref 22–32)
Chloride: 105 mmol/L (ref 101–111)
Creatinine, Ser: 0.85 mg/dL (ref 0.61–1.24)
Glucose, Bld: 114 mg/dL — ABNORMAL HIGH (ref 65–99)
Potassium: 3.8 mmol/L (ref 3.5–5.1)
SODIUM: 139 mmol/L (ref 135–145)

## 2014-11-06 LAB — CBC WITH DIFFERENTIAL/PLATELET
BASOS PCT: 0 %
Basophils Absolute: 0 10*3/uL (ref 0–0.1)
Eosinophils Absolute: 0.1 10*3/uL (ref 0–0.7)
Eosinophils Relative: 2 %
HCT: 37.2 % — ABNORMAL LOW (ref 40.0–52.0)
HEMOGLOBIN: 12.7 g/dL — AB (ref 13.0–18.0)
Lymphocytes Relative: 23 %
Lymphs Abs: 1.3 10*3/uL (ref 1.0–3.6)
MCH: 33.3 pg (ref 26.0–34.0)
MCHC: 34.2 g/dL (ref 32.0–36.0)
MCV: 97.4 fL (ref 80.0–100.0)
MONOS PCT: 11 %
Monocytes Absolute: 0.6 10*3/uL (ref 0.2–1.0)
NEUTROS ABS: 3.8 10*3/uL (ref 1.4–6.5)
NEUTROS PCT: 64 %
Platelets: 166 10*3/uL (ref 150–440)
RBC: 3.82 MIL/uL — ABNORMAL LOW (ref 4.40–5.90)
RDW: 14.1 % (ref 11.5–14.5)
WBC: 5.8 10*3/uL (ref 3.8–10.6)

## 2014-11-06 MED ORDER — SODIUM CHLORIDE 0.9 % IV SOLN
INTRAVENOUS | Status: DC
  Administered 2014-11-06: 16:00:00 via INTRAVENOUS
  Filled 2014-11-06: qty 1000

## 2014-11-06 MED ORDER — HEPARIN SOD (PORK) LOCK FLUSH 100 UNIT/ML IV SOLN
INTRAVENOUS | Status: AC
  Filled 2014-11-06: qty 5

## 2014-11-06 MED ORDER — SODIUM CHLORIDE 0.9 % IV SOLN
4.0000 mg | Freq: Once | INTRAVENOUS | Status: AC
Start: 2014-11-06 — End: 2014-11-06
  Administered 2014-11-06: 4 mg via INTRAVENOUS
  Filled 2014-11-06: qty 5

## 2014-11-06 MED ORDER — SODIUM CHLORIDE 0.9 % IJ SOLN
10.0000 mL | INTRAMUSCULAR | Status: DC | PRN
  Administered 2014-11-06: 10 mL
  Filled 2014-11-06: qty 10

## 2014-11-06 MED ORDER — HEPARIN SOD (PORK) LOCK FLUSH 100 UNIT/ML IV SOLN: 500.0000 [IU] | Freq: Once | INTRAVENOUS | Status: DC | PRN

## 2014-11-06 MED ORDER — HEPARIN SOD (PORK) LOCK FLUSH 100 UNIT/ML IV SOLN
500.0000 [IU] | Freq: Once | INTRAVENOUS | Status: AC | PRN
  Administered 2014-11-06: 500 [IU]

## 2014-11-08 ENCOUNTER — Other Ambulatory Visit: Payer: Self-pay | Admitting: *Deleted

## 2014-11-08 ENCOUNTER — Telehealth: Payer: Self-pay | Admitting: *Deleted

## 2014-11-08 MED ORDER — DIAZEPAM 2 MG PO TABS: 2.0000 mg | ORAL_TABLET | Freq: Every evening | ORAL | Status: DC | PRN

## 2014-11-08 MED ORDER — MORPHINE SULFATE ER 15 MG PO TBCR: 15.0000 mg | EXTENDED_RELEASE_TABLET | Freq: Three times a day (TID) | ORAL | Status: DC

## 2014-11-08 NOTE — Telephone Encounter (Signed)
rx faxed to [facility] 

## 2014-11-08 NOTE — Telephone Encounter (Signed)
Informed that prescription is ready to pick up  

## 2014-11-10 ENCOUNTER — Other Ambulatory Visit: Payer: Self-pay | Admitting: *Deleted

## 2014-11-10 MED ORDER — DIAZEPAM 2 MG PO TABS: 2.0000 mg | ORAL_TABLET | Freq: Every evening | ORAL | Status: DC | PRN

## 2014-11-10 NOTE — Telephone Encounter (Signed)
This was done already adn when I called [facility] they confirmed they have it

## 2014-11-17 NOTE — Progress Notes (Signed)
[place]  [phone] [phone]  ID: Russell Keller OB: [birth year]  MR#: [id number]  HTD#:[id number]  Patient Care Team: Glean Hess, MD as PCP - General (Family Medicine) Algernon Huxley, MD as Referring Physician (Vascular Surgery) Glean Hess, MD (Family Medicine)  CHIEF COMPLAINT:  Multiple myeloma.  INTERVAL HISTORY: Patient returns to clinic today for laboratory work, further evaluation, and continuation of Zometa.  He continues to have weakness and fatigue, but states it is improved.  His right shoulder pain is still evident, but well-controlled. He denies any abdominal pain.  He denies any chest pain or shortness of breath.  He denies any fevers, chills, or night sweats.  He denies any nausea, vomiting, constipation, or diarrhea.  He denies any easy bleeding or bruising.  Patient offers no further specific complaints today.  REVIEW OF SYSTEMS:   Review of Systems  Constitutional: Positive for malaise/fatigue.  Respiratory: Negative.   Cardiovascular: Negative.   Musculoskeletal: Positive for back pain and joint pain.  Neurological: Positive for weakness.    As per HPI. Otherwise, a complete review of systems is negatve.  PAST MEDICAL HISTORY: Past Medical History  Diagnosis Date  . Diabetes mellitus without complication   . Cancer   . DVT (deep venous thrombosis)   . Hypertension   . COPD (chronic obstructive pulmonary disease)   . Diabetes mellitus without complication     Patient states he takes Glimeperide  . Plasmocytoma 2004  . Multiple myeloma     PAST SURGICAL HISTORY: Past Surgical History  Procedure Laterality Date  . Peripheral vascular catheterization N/A 08/22/2014    Procedure: Glori Luis Cath Insertion;  Surgeon: Katha Cabal, MD;  Location: [facility];  Service: Cardiovascular;  Laterality: N/A;  . Limbal stem cell transplant      Multiple Myleoma  . Tonsillectomy    . 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 Cataract Right   . Ileostomy closure  2012    FAMILY HISTORY:  Unchanged. No report of malignancy or other chronic disease.     ADVANCED DIRECTIVES:    HEALTH MAINTENANCE: Social History  Substance Use Topics  . Smoking status: Current Every Day Smoker -- 0.50 packs/day for 50 years    Types: Cigarettes  . Smokeless tobacco: Never Used  . Alcohol Use: No     Colonoscopy:  PAP:  Bone density:  Lipid panel:  No Known Allergies  Current Outpatient Prescriptions  Medication Sig Dispense Refill  . albuterol (PROVENTIL HFA;VENTOLIN HFA) 108 (90 BASE) MCG/ACT inhaler Inhale 2 puffs into the lungs every 6 (six) hours as needed for wheezing or shortness of breath. 3 Inhaler 3  . azelastine (ASTELIN) 0.1 % nasal spray Place 2 sprays into both nostrils 2 (two) times daily. Use in each nostril as directed 30 mL 0  . baclofen (LIORESAL) 10 MG tablet Take 1 tablet (10 mg total) by mouth 3 (three) times daily as needed for muscle spasms. 270 each 1  . baclofen (LIORESAL) 10 MG tablet Take 10 mg by mouth 3 (three) times daily.    Marland Kitchen dexamethasone (DECADRON) 4 MG tablet TAKE 5 TABLETS ONE TIME WEEKLY ON MONDAY  60 tablet 5  . fluticasone (FLONASE) 50 MCG/ACT nasal spray Place 2 sprays into both nostrils daily as needed for rhinitis.    Marland Kitchen gabapentin (NEURONTIN) 300 MG capsule Take 300 mg by mouth 2 (two) times daily.    Marland Kitchen gabapentin (  NEURONTIN) 300 MG capsule Take 300 mg by mouth 2 (two) times daily.    Marland Kitchen glimepiride (AMARYL) 2 MG tablet Take 2 mg by mouth daily with breakfast.    . glimepiride (AMARYL) 2 MG tablet Take 2 mg by mouth daily with breakfast.    . ibuprofen (ADVIL,MOTRIN) 800 MG tablet Take 800 mg by mouth every 8 (eight) hours as needed for moderate pain.     Marland Kitchen ibuprofen (ADVIL,MOTRIN) 800 MG tablet Take 800 mg by mouth 3 (three) times daily.    . insulin aspart (NOVOLOG FLEXPEN) 100 UNIT/ML FlexPen Inject into the skin 3 (three)  times daily with meals.    . insulin aspart (NOVOLOG) 100 UNIT/ML injection Inject into the skin 3 (three) times daily before meals.    Marland Kitchen ipratropium-albuterol (DUONEB) 0.5-2.5 (3) MG/3ML SOLN Take 3 mLs by nebulization every 6 (six) hours as needed. 360 mL 3  . levalbuterol (XOPENEX HFA) 45 MCG/ACT inhaler Inhale into the lungs every 4 (four) hours as needed for wheezing.    . levalbuterol (XOPENEX HFA) 45 MCG/ACT inhaler Inhale 2 puffs into the lungs every 8 (eight) hours as needed for wheezing.    . lidocaine-prilocaine (EMLA) cream Apply 1 application topically as needed. Apply to port then cover with saran wrap 1-2 hours before appointment 30 g 2  . Melatonin 5 MG TABS Take 1 tablet by mouth at bedtime as needed (for sleep).    Marland Kitchen omeprazole (PRILOSEC) 20 MG capsule Take 20 mg by mouth daily as needed (for indigestion/heartburn).     Marland Kitchen omeprazole (PRILOSEC) 20 MG capsule Take 20 mg by mouth 2 (two) times daily before a meal.    . prednisoLONE acetate (PRED FORTE) 1 % ophthalmic suspension Place 1 drop into the right eye 2 (two) times daily.    . prednisoLONE acetate (PRED FORTE) 1 % ophthalmic suspension 1 drop 4 (four) times daily.    . prochlorperazine (COMPAZINE) 10 MG tablet Take 10 mg by mouth every 6 (six) hours as needed for nausea or vomiting.    . valACYclovir (VALTREX) 500 MG tablet Take 500 mg by mouth daily.    . valACYclovir (VALTREX) 500 MG tablet Take 500 mg by mouth at bedtime.    . diazepam (VALIUM) 2 MG tablet Take 1 tablet (2 mg total) by mouth at bedtime as needed for anxiety. 30 tablet 1  . morphine (MS CONTIN) 15 MG 12 hr tablet Take 1 tablet (15 mg total) by mouth every 8 (eight) hours. 90 tablet 0   No current facility-administered medications for this visit.    OBJECTIVE: Filed Vitals:   11/06/14 1459  BP: 124/76  Pulse: 83  Temp: 96.1 F (35.6 C)  Resp: 20     Body mass index is 23.25 kg/(m^2).    ECOG FS:0 - Asymptomatic  General: Well-developed,  well-nourished, no acute distress. Eyes: anicteric sclera. Lungs: Clear to auscultation bilaterally. Heart: Regular rate and rhythm. No rubs, murmurs, or gallops. Abdomen: Soft, nontender, nondistended. No organomegaly noted, normoactive bowel sounds. Musculoskeletal: No edema, cyanosis, or clubbing. Neuro: Alert, answering all questions appropriately. Cranial nerves grossly intact. Skin: No rashes or petechiae noted. Psych: Normal affect.    LAB RESULTS:  Lab Results  Component Value Date   NA 139 11/06/2014   K 3.8 11/06/2014   CL 105 11/06/2014   CO2 31 11/06/2014   GLUCOSE 114* 11/06/2014   BUN 24* 11/06/2014   CREATININE 0.85 11/06/2014   CALCIUM 9.0 11/06/2014   PROT 5.9*  08/15/2014   ALBUMIN 2.9* 08/15/2014   AST 27 08/15/2014   ALT 24 08/15/2014   ALKPHOS 83 08/15/2014   BILITOT 0.7 08/15/2014   GFRNONAA >60 11/06/2014   GFRAA >60 11/06/2014    Lab Results  Component Value Date   WBC 5.8 11/06/2014   NEUTROABS 3.8 11/06/2014   HGB 12.7* 11/06/2014   HCT 37.2* 11/06/2014   MCV 97.4 11/06/2014   PLT 166 11/06/2014     STUDIES: Mr Shoulder Right W Wo Contrast  10/20/2014   CLINICAL DATA:  History of multiple myeloma. Right scapular lesion. Lateral right shoulder pain.  EXAM: MRI OF THE RIGHT SHOULDER WITHOUT AND WITH CONTRAST  TECHNIQUE: Multiplanar, multisequence MR imaging was performed both before and after administration of intravenous contrast.  CONTRAST:  57m MULTIHANCE GADOBENATE DIMEGLUMINE 529 MG/ML IV SOLN  COMPARISON:  CT chest 08/02/2014  FINDINGS: Rotator cuff: There is moderate tendinosis of the supraspinatus tendon with a partial thickness bursal surface tear of the anterior fibers. Infraspinatus tendon is intact. Teres minor tendon is intact. Subscapularis tendon is intact.  Muscles: No atrophy or fatty replacement of the muscles of the rotator cuff. There is a linear band of T2 hyperintense signal in the posterior deltoid with enhancement on  postcontrast imaging adjacent to the musculotendinous junction suggesting muscle strain or contusion.  Biceps Long Head: Intraarticular and extraarticular portions of the biceps tendon are intact.  Acromioclavicular Joint: Moderate degenerative changes of the acromioclavicular joint. Type II acromion. Small amount of fluid present in the subacromial/subdeltoid bursa which is consistent with bursitis.  Glenohumeral Joint: No joint effusion.  No chondral defect.  Labrum: Limited evaluation secondary to lack of intra-articular contrast.  Bones: There is a 2.2 x 3.9 x 2.9 cm expansile mass of the body of the scapula consistent with malignancy similar in appearance to the prior exam of 08/02/2014. There is a T1 hypo intense and T2 hyperintense enhancing right rib lesion consistent with malignancy. There are 2 rounded hypointense lesions in the neck of the glenoid. There is subchondral reactive marrow change in the superior posterior humeral head and at the supraspinatus insertion.  IMPRESSION: 1. Moderate tendinosis of the supraspinatus tendon with a partial thickness bursal surface tear of the anterior fibers. 2. Linear band of T2 hyperintense signal in the posterior deltoid with enhancement suggesting muscle strain or contusion. 3. Multiple osseous lesions with the largest expansile mass in the body of the scapula consistent multiple myeloma given the patient's history. The osseous lesions are essentially unchanged compared with the prior CT chest dated 08/02/2014. 4. Mild subacromial/subdeltoid bursitis.   Electronically Signed   By: HKathreen Devoid  On: 10/20/2014 14:17    ASSESSMENT: Multiple myeloma[facility] [place]  PLAN:    1.  Multiple myeloma: Continue to hold Revlimid at this time. His M spike has plateaued and is now 0.5. Continue 81 mg aspirin daily. Proceed with Zometa today. Patient now has a port, therefore return to clinic in 4  weeks for laboratory work and continuation of Zometa.  2.  History of DVT/PE: He is no longer taking Coumadin. Patient has reinitiated 81 mg aspirin. 3   Pain: Continue current regimen. XRT has completed. 4.  Hypokalemia: Resolved, continue to monitor.   5.  Diarrhea: Patient has been instructed to use OTC Imodium as needed. 6.  Back pain: Chronic.  Continue ibuprofen as needed.  7.  Kidney mass: Highly suspicious for underlying  malignancy.  Patient had evaluation by urology and opted for surveillance with repeat CT scan in 6 months. Repeat CT scan in January 2017. 8. Right shoulder pain: MRI results reviewed independently and reported as above. Osseous lesions are consistent with myeloma and essentially unchanged. Patient's pain is likely related to the partial thickness tear of his bursal surface.  Patient expressed understanding and was in agreement with this plan. He also understands that He can call clinic at any time with any questions, concerns, or complaints.    Lloyd Huger, MD   11/17/2014 7:30 PM

## 2014-12-07 ENCOUNTER — Telehealth: Payer: Self-pay | Admitting: *Deleted

## 2014-12-07 MED ORDER — MORPHINE SULFATE ER 15 MG PO TBCR: 15.0000 mg | EXTENDED_RELEASE_TABLET | Freq: Three times a day (TID) | ORAL | Status: DC

## 2014-12-07 NOTE — Telephone Encounter (Signed)
Informed that prescription is ready to pick up  

## 2014-12-11 ENCOUNTER — Inpatient Hospital Stay: Payer: Commercial Managed Care - HMO

## 2014-12-11 ENCOUNTER — Inpatient Hospital Stay: Payer: Commercial Managed Care - HMO | Attending: Oncology | Admitting: Oncology

## 2014-12-11 VITALS — BP 112/75 | HR 71 | Temp 97.1°F | Resp 18 | Wt 167.1 lb

## 2014-12-11 DIAGNOSIS — R197 Diarrhea, unspecified: Secondary | ICD-10-CM | POA: Insufficient documentation

## 2014-12-11 DIAGNOSIS — M549 Dorsalgia, unspecified: Secondary | ICD-10-CM

## 2014-12-11 DIAGNOSIS — F17210 Nicotine dependence, cigarettes, uncomplicated: Secondary | ICD-10-CM | POA: Insufficient documentation

## 2014-12-11 DIAGNOSIS — C9000 Multiple myeloma not having achieved remission: Secondary | ICD-10-CM | POA: Insufficient documentation

## 2014-12-11 DIAGNOSIS — J449 Chronic obstructive pulmonary disease, unspecified: Secondary | ICD-10-CM | POA: Insufficient documentation

## 2014-12-11 DIAGNOSIS — C9001 Multiple myeloma in remission: Secondary | ICD-10-CM

## 2014-12-11 DIAGNOSIS — C9002 Multiple myeloma in relapse: Secondary | ICD-10-CM

## 2014-12-11 DIAGNOSIS — I10 Essential (primary) hypertension: Secondary | ICD-10-CM | POA: Diagnosis not present

## 2014-12-11 DIAGNOSIS — E119 Type 2 diabetes mellitus without complications: Secondary | ICD-10-CM | POA: Diagnosis not present

## 2014-12-11 DIAGNOSIS — M25511 Pain in right shoulder: Secondary | ICD-10-CM | POA: Diagnosis not present

## 2014-12-11 DIAGNOSIS — G8929 Other chronic pain: Secondary | ICD-10-CM | POA: Insufficient documentation

## 2014-12-11 DIAGNOSIS — Z86718 Personal history of other venous thrombosis and embolism: Secondary | ICD-10-CM | POA: Insufficient documentation

## 2014-12-11 DIAGNOSIS — Z79899 Other long term (current) drug therapy: Secondary | ICD-10-CM | POA: Insufficient documentation

## 2014-12-11 DIAGNOSIS — N2889 Other specified disorders of kidney and ureter: Secondary | ICD-10-CM | POA: Diagnosis not present

## 2014-12-11 LAB — BASIC METABOLIC PANEL
ANION GAP: 4 — AB (ref 5–15)
BUN: 35 mg/dL — ABNORMAL HIGH (ref 6–20)
CHLORIDE: 108 mmol/L (ref 101–111)
CO2: 27 mmol/L (ref 22–32)
Calcium: 8.4 mg/dL — ABNORMAL LOW (ref 8.9–10.3)
Creatinine, Ser: 0.87 mg/dL (ref 0.61–1.24)
Glucose, Bld: 100 mg/dL — ABNORMAL HIGH (ref 65–99)
POTASSIUM: 3.8 mmol/L (ref 3.5–5.1)
SODIUM: 139 mmol/L (ref 135–145)

## 2014-12-11 MED ORDER — HEPARIN SOD (PORK) LOCK FLUSH 100 UNIT/ML IV SOLN
500.0000 [IU] | Freq: Once | INTRAVENOUS | Status: AC | PRN
  Administered 2014-12-11: 500 [IU]
  Filled 2014-12-11: qty 5

## 2014-12-11 MED ORDER — ZOLEDRONIC ACID 4 MG/100ML IV SOLN
4.0000 mg | Freq: Once | INTRAVENOUS | Status: AC
  Administered 2014-12-11: 4 mg via INTRAVENOUS
  Filled 2014-12-11: qty 100

## 2014-12-11 MED ORDER — SODIUM CHLORIDE 0.9 % IV SOLN
Freq: Once | INTRAVENOUS | Status: AC
  Administered 2014-12-11: 11:00:00 via INTRAVENOUS
  Filled 2014-12-11: qty 1000

## 2014-12-11 NOTE — Progress Notes (Signed)
[place]  [phone]) [phone] [phone]) [phone]  ID: Nanetta Batty OB: 06-12-1945  MR#: [id number]  ZDG#:[id number]  Patient Care Team: Glean Hess, MD as PCP - General (Family Medicine) Algernon Huxley, MD as Referring Physician (Vascular Surgery) Glean Hess, MD (Family Medicine)  CHIEF COMPLAINT:  Multiple myeloma.  INTERVAL HISTORY: Patient returns to clinic today for laboratory work, further evaluation, and continuation of Zometa.  He continues to have weakness and fatigue.  His right shoulder pain is still evident, but well-controlled. He denies any abdominal pain.  He denies any chest pain or shortness of breath.  He denies any fevers, chills, or night sweats.  He denies any nausea, vomiting, constipation, or diarrhea.  He denies any easy bleeding or bruising.  Patient offers no further specific complaints today.  REVIEW OF SYSTEMS:   Review of Systems  Constitutional: Positive for malaise/fatigue. Negative for fever.  Respiratory: Negative.   Cardiovascular: Negative.   Musculoskeletal: Positive for back pain and joint pain.  Neurological: Positive for weakness.    As per HPI. Otherwise, a complete review of systems is negatve.  PAST MEDICAL HISTORY: Past Medical History  Diagnosis Date  . Diabetes mellitus without complication   . Cancer   . DVT (deep venous thrombosis)   . Hypertension   . COPD (chronic obstructive pulmonary disease)   . Diabetes mellitus without complication     Patient states he takes Glimeperide  . Plasmocytoma 2004  . Multiple myeloma     PAST SURGICAL HISTORY: Past Surgical History  Procedure Laterality Date  . Peripheral vascular catheterization N/A 08/22/2014    Procedure: Glori Luis Cath Insertion;  Surgeon: Katha Cabal, MD;  Location: [facility];  Service: Cardiovascular;  Laterality: N/A;  . Limbal stem cell transplant      Multiple Myleoma  . Tonsillectomy    . Colon, bladder  fistula repair  2012    with ileostomy  . Cataract Right   . Ileostomy closure  2012    FAMILY HISTORY:  Unchanged. No report of malignancy or other chronic disease.     ADVANCED DIRECTIVES:    HEALTH MAINTENANCE: Social History  Substance Use Topics  . Smoking status: Current Every Day Smoker -- 0.50 packs/day for 50 years    Types: Cigarettes  . Smokeless tobacco: Never Used  . Alcohol Use: No     Colonoscopy:  PAP:  Bone density:  Lipid panel:  No Known Allergies  Current Outpatient Prescriptions  Medication Sig Dispense Refill  . albuterol (PROVENTIL HFA;VENTOLIN HFA) 108 (90 BASE) MCG/ACT inhaler Inhale 2 puffs into the lungs every 6 (six) hours as needed for wheezing or shortness of breath. 3 Inhaler 3  . azelastine (ASTELIN) 0.1 % nasal spray Place 2 sprays into both nostrils 2 (two) times daily. Use in each nostril as directed 30 mL 0  . baclofen (LIORESAL) 10 MG tablet Take 1 tablet (10 mg total) by mouth 3 (three) times daily as needed for muscle spasms. 270 each 1  . baclofen (LIORESAL) 10 MG tablet Take 10 mg by mouth 3 (three) times daily.    Marland Kitchen dexamethasone (DECADRON) 4 MG tablet TAKE 5 TABLETS ONE TIME WEEKLY ON MONDAY  60 tablet 5  . diazepam (VALIUM) 2 MG tablet Take 1 tablet (2 mg total) by mouth at bedtime as needed for anxiety. 30 tablet 1  . fluticasone (FLONASE) 50 MCG/ACT nasal spray Place 2 sprays into both nostrils daily as needed for rhinitis.    Marland Kitchen  gabapentin (NEURONTIN) 300 MG capsule Take 300 mg by mouth 2 (two) times daily.    Marland Kitchen glimepiride (AMARYL) 2 MG tablet Take 2 mg by mouth daily with breakfast.    . ibuprofen (ADVIL,MOTRIN) 800 MG tablet Take 800 mg by mouth every 8 (eight) hours as needed for moderate pain.     Marland Kitchen ipratropium-albuterol (DUONEB) 0.5-2.5 (3) MG/3ML SOLN Take 3 mLs by nebulization every 6 (six) hours as needed. 360 mL 3  . levalbuterol (XOPENEX HFA) 45 MCG/ACT inhaler Inhale into the lungs every 4 (four) hours as needed for  wheezing.    . lidocaine-prilocaine (EMLA) cream Apply 1 application topically as needed. Apply to port then cover with saran wrap 1-2 hours before appointment 30 g 2  . Melatonin 5 MG TABS Take 1 tablet by mouth at bedtime as needed (for sleep).    . morphine (MS CONTIN) 15 MG 12 hr tablet Take 1 tablet (15 mg total) by mouth every 8 (eight) hours. 90 tablet 0  . omeprazole (PRILOSEC) 20 MG capsule Take 20 mg by mouth daily as needed (for indigestion/heartburn).     . prednisoLONE acetate (PRED FORTE) 1 % ophthalmic suspension Place 1 drop into the right eye 2 (two) times daily.    . prochlorperazine (COMPAZINE) 10 MG tablet Take 10 mg by mouth every 6 (six) hours as needed for nausea or vomiting.    . valACYclovir (VALTREX) 500 MG tablet Take 500 mg by mouth daily.     No current facility-administered medications for this visit.    OBJECTIVE: Filed Vitals:   12/11/14 1012  BP: 112/75  Pulse: 71  Temp: 97.1 F (36.2 C)  Resp: 18     Body mass index is 23.98 kg/(m^2).    ECOG FS:0 - Asymptomatic  General: Well-developed, well-nourished, no acute distress. Eyes: anicteric sclera. Lungs: Clear to auscultation bilaterally. Heart: Regular rate and rhythm. No rubs, murmurs, or gallops. Abdomen: Soft, nontender, nondistended. No organomegaly noted, normoactive bowel sounds. Musculoskeletal: No edema, cyanosis, or clubbing. Neuro: Alert, answering all questions appropriately. Cranial nerves grossly intact. Skin: No rashes or petechiae noted. Psych: Normal affect.    LAB RESULTS:  Lab Results  Component Value Date   NA 139 12/11/2014   K 3.8 12/11/2014   CL 108 12/11/2014   CO2 27 12/11/2014   GLUCOSE 100* 12/11/2014   BUN 35* 12/11/2014   CREATININE 0.87 12/11/2014   CALCIUM 8.4* 12/11/2014   PROT 5.9* 08/15/2014   ALBUMIN 2.9* 08/15/2014   AST 27 08/15/2014   ALT 24 08/15/2014   ALKPHOS 83 08/15/2014   BILITOT 0.7 08/15/2014   GFRNONAA >60 12/11/2014   GFRAA >60  12/11/2014    Lab Results  Component Value Date   WBC 5.8 11/06/2014   NEUTROABS 3.8 11/06/2014   HGB 12.7* 11/06/2014   HCT 37.2* 11/06/2014   MCV 97.4 11/06/2014   PLT 166 11/06/2014     STUDIES: No results found.  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  PLAN:    1.  Multiple myeloma: Continue to hold Revlimid at this time. His M spike has plateaued and is now 0.5. Continue 81 mg aspirin daily. Proceed with Zometa today. Patient has asked for less frequent visits, therefore return to clinic in six weeks for laboratory work and continuation of Zometa. Patient now has a port. 2.  History of DVT/PE: He is no longer taking Coumadin. Continue 81 mg aspirin. 3  Pain: Continue current narcotic regimen. XRT has completed. 4.  Hypokalemia: Resolved, continue to monitor.   5.  Diarrhea: Patient has been instructed to use OTC Imodium as needed. 6.  Back pain: Chronic.  Continue ibuprofen as needed.  7.  Kidney mass: Highly suspicious for underlying malignancy.  Patient had evaluation by urology and opted for surveillance with repeat CT scan in January 2017. 8. Right shoulder pain: MRI results reviewed independently and reported as above. Osseous lesions are consistent with myeloma and essentially unchanged. Patient's pain is likely related to the partial thickness tear of his bursal surface.  Patient expressed understanding and was in agreement with this plan. He also understands that He can call clinic at any time with any questions, concerns, or complaints.    Lloyd Huger, MD   12/11/2014 7:00 PM

## 2014-12-12 LAB — PROTEIN ELECTROPHORESIS, SERUM
A/G RATIO SPE: 1.4 (ref 0.7–1.7)
ALPHA-2-GLOBULIN: 0.6 g/dL (ref 0.4–1.0)
Albumin ELP: 3.5 g/dL (ref 2.9–4.4)
Alpha-1-Globulin: 0.2 g/dL (ref 0.0–0.4)
BETA GLOBULIN: 0.7 g/dL (ref 0.7–1.3)
GAMMA GLOBULIN: 1 g/dL (ref 0.4–1.8)
Globulin, Total: 2.5 g/dL (ref 2.2–3.9)
M-Spike, %: 0.6 g/dL — ABNORMAL HIGH
Total Protein ELP: 6 g/dL (ref 6.0–8.5)

## 2015-01-19 ENCOUNTER — Other Ambulatory Visit: Payer: Self-pay | Admitting: *Deleted

## 2015-01-19 DIAGNOSIS — C9002 Multiple myeloma in relapse: Secondary | ICD-10-CM

## 2015-01-22 ENCOUNTER — Inpatient Hospital Stay (HOSPITAL_BASED_OUTPATIENT_CLINIC_OR_DEPARTMENT_OTHER): Payer: Commercial Managed Care - HMO | Admitting: Oncology

## 2015-01-22 ENCOUNTER — Inpatient Hospital Stay: Payer: Commercial Managed Care - HMO | Attending: Oncology

## 2015-01-22 ENCOUNTER — Inpatient Hospital Stay: Payer: Commercial Managed Care - HMO

## 2015-01-22 VITALS — BP 138/79 | HR 90 | Temp 97.9°F | Resp 18 | Wt 164.2 lb

## 2015-01-22 DIAGNOSIS — Z86711 Personal history of pulmonary embolism: Secondary | ICD-10-CM | POA: Diagnosis not present

## 2015-01-22 DIAGNOSIS — N2889 Other specified disorders of kidney and ureter: Secondary | ICD-10-CM | POA: Insufficient documentation

## 2015-01-22 DIAGNOSIS — Z86718 Personal history of other venous thrombosis and embolism: Secondary | ICD-10-CM | POA: Insufficient documentation

## 2015-01-22 DIAGNOSIS — J449 Chronic obstructive pulmonary disease, unspecified: Secondary | ICD-10-CM | POA: Insufficient documentation

## 2015-01-22 DIAGNOSIS — G8929 Other chronic pain: Secondary | ICD-10-CM | POA: Diagnosis not present

## 2015-01-22 DIAGNOSIS — D72819 Decreased white blood cell count, unspecified: Secondary | ICD-10-CM | POA: Insufficient documentation

## 2015-01-22 DIAGNOSIS — C9000 Multiple myeloma not having achieved remission: Secondary | ICD-10-CM

## 2015-01-22 DIAGNOSIS — Z79899 Other long term (current) drug therapy: Secondary | ICD-10-CM | POA: Insufficient documentation

## 2015-01-22 DIAGNOSIS — M549 Dorsalgia, unspecified: Secondary | ICD-10-CM | POA: Insufficient documentation

## 2015-01-22 DIAGNOSIS — M25512 Pain in left shoulder: Secondary | ICD-10-CM | POA: Diagnosis not present

## 2015-01-22 DIAGNOSIS — I10 Essential (primary) hypertension: Secondary | ICD-10-CM | POA: Insufficient documentation

## 2015-01-22 DIAGNOSIS — F17210 Nicotine dependence, cigarettes, uncomplicated: Secondary | ICD-10-CM

## 2015-01-22 DIAGNOSIS — M25511 Pain in right shoulder: Secondary | ICD-10-CM | POA: Diagnosis not present

## 2015-01-22 DIAGNOSIS — R197 Diarrhea, unspecified: Secondary | ICD-10-CM

## 2015-01-22 DIAGNOSIS — C9002 Multiple myeloma in relapse: Secondary | ICD-10-CM

## 2015-01-22 DIAGNOSIS — C9001 Multiple myeloma in remission: Secondary | ICD-10-CM

## 2015-01-22 LAB — CBC WITH DIFFERENTIAL/PLATELET
BASOS ABS: 0 10*3/uL (ref 0–0.1)
BASOS PCT: 0 %
Eosinophils Absolute: 0.1 10*3/uL (ref 0–0.7)
Eosinophils Relative: 5 %
HEMATOCRIT: 35.5 % — AB (ref 40.0–52.0)
HEMOGLOBIN: 12.1 g/dL — AB (ref 13.0–18.0)
LYMPHS PCT: 36 %
Lymphs Abs: 0.8 10*3/uL — ABNORMAL LOW (ref 1.0–3.6)
MCH: 32 pg (ref 26.0–34.0)
MCHC: 34 g/dL (ref 32.0–36.0)
MCV: 93.9 fL (ref 80.0–100.0)
MONOS PCT: 20 %
Monocytes Absolute: 0.5 10*3/uL (ref 0.2–1.0)
NEUTROS ABS: 0.9 10*3/uL — AB (ref 1.4–6.5)
NEUTROS PCT: 39 %
Platelets: 161 10*3/uL (ref 150–440)
RBC: 3.78 MIL/uL — ABNORMAL LOW (ref 4.40–5.90)
RDW: 13.7 % (ref 11.5–14.5)
WBC: 2.3 10*3/uL — ABNORMAL LOW (ref 3.8–10.6)

## 2015-01-22 LAB — BASIC METABOLIC PANEL
Anion gap: 5 (ref 5–15)
BUN: 29 mg/dL — AB (ref 6–20)
CO2: 26 mmol/L (ref 22–32)
CREATININE: 0.83 mg/dL (ref 0.61–1.24)
Calcium: 9.5 mg/dL (ref 8.9–10.3)
Chloride: 105 mmol/L (ref 101–111)
GFR calc Af Amer: >60 mL/min (ref >60)
GLUCOSE: 149 mg/dL — AB (ref 65–99)
POTASSIUM: 3.3 mmol/L — AB (ref 3.5–5.1)
Sodium: 136 mmol/L (ref 135–145)

## 2015-01-22 MED ORDER — HEPARIN SOD (PORK) LOCK FLUSH 100 UNIT/ML IV SOLN: 500.0000 [IU] | Freq: Once | INTRAVENOUS | Status: DC

## 2015-01-22 MED ORDER — SODIUM CHLORIDE 0.9 % IJ SOLN
10.0000 mL | INTRAMUSCULAR | Status: DC | PRN
  Filled 2015-01-22: qty 10

## 2015-01-22 MED ORDER — SODIUM CHLORIDE 0.9 % IJ SOLN
10.0000 mL | INTRAMUSCULAR | Status: DC | PRN
  Administered 2015-01-22: 10 mL via INTRAVENOUS
  Filled 2015-01-22: qty 10

## 2015-01-22 MED ORDER — SODIUM CHLORIDE 0.9 % IV SOLN
Freq: Once | INTRAVENOUS | Status: AC
  Administered 2015-01-22: 16:00:00 via INTRAVENOUS
  Filled 2015-01-22: qty 1000

## 2015-01-22 MED ORDER — HEPARIN SOD (PORK) LOCK FLUSH 100 UNIT/ML IV SOLN
500.0000 [IU] | Freq: Once | INTRAVENOUS | Status: AC
  Administered 2015-01-22: 500 [IU] via INTRAVENOUS
  Filled 2015-01-22: qty 5

## 2015-01-22 MED ORDER — ZOLEDRONIC ACID 4 MG/100ML IV SOLN
4.0000 mg | Freq: Once | INTRAVENOUS | Status: AC
  Administered 2015-01-22: 4 mg via INTRAVENOUS
  Filled 2015-01-22: qty 100

## 2015-01-22 MED ORDER — MORPHINE SULFATE ER 15 MG PO TBCR: 15.0000 mg | EXTENDED_RELEASE_TABLET | Freq: Three times a day (TID) | ORAL | Status: DC

## 2015-01-22 NOTE — Progress Notes (Signed)
Patient unable to provide a urine sample at this time. MD, Dr. Grayland Ormond, notified via telephone. Per MD, Dr. Grayland Ormond, order: staff does not need to collect urine specimen today.

## 2015-01-22 NOTE — Progress Notes (Signed)
Patient hurt his left shoulder when moving furniture about 1 week ago and has been seeing a Restaurant manager, fast food.  Also having low back pain since his last visit.  The pain medication regimen helps with the back pain but it does nothing for the shoulder pain.

## 2015-01-23 LAB — PROTEIN ELECTROPHORESIS, SERUM
A/G RATIO SPE: 1.1 (ref 0.7–1.7)
ALBUMIN ELP: 3.3 g/dL (ref 2.9–4.4)
ALPHA-2-GLOBULIN: 0.7 g/dL (ref 0.4–1.0)
Alpha-1-Globulin: 0.3 g/dL (ref 0.0–0.4)
BETA GLOBULIN: 0.7 g/dL (ref 0.7–1.3)
Gamma Globulin: 1.3 g/dL (ref 0.4–1.8)
Globulin, Total: 3.1 g/dL (ref 2.2–3.9)
M-Spike, %: 1 g/dL — ABNORMAL HIGH
Total Protein ELP: 6.4 g/dL (ref 6.0–8.5)

## 2015-01-24 ENCOUNTER — Telehealth: Payer: Self-pay | Admitting: *Deleted

## 2015-01-24 NOTE — Telephone Encounter (Signed)
Asking if he can double up on his baclofen dosage for increased shoulder pain

## 2015-01-24 NOTE — Telephone Encounter (Signed)
Pre Dr Grayland Ormond, not to increase baclofen. He is to use his oxycodone and MS. Discussed this with Cecille Rubin

## 2015-02-06 ENCOUNTER — Other Ambulatory Visit: Payer: Self-pay | Admitting: *Deleted

## 2015-02-06 MED ORDER — OXYCODONE HCL 5 MG PO TABS: 5.0000 mg | ORAL_TABLET | ORAL | Status: DC | PRN

## 2015-02-07 ENCOUNTER — Other Ambulatory Visit: Payer: Self-pay | Admitting: Oncology

## 2015-02-07 ENCOUNTER — Other Ambulatory Visit: Payer: Self-pay | Admitting: Nurse Practitioner

## 2015-02-07 ENCOUNTER — Other Ambulatory Visit: Payer: Self-pay | Admitting: *Deleted

## 2015-02-07 MED ORDER — DIAZEPAM 2 MG PO TABS: 2.0000 mg | ORAL_TABLET | Freq: Every evening | ORAL | Status: AC | PRN

## 2015-02-10 NOTE — Progress Notes (Signed)
[place]  Telephone:(336) [phone] Fax:(336) [phone]  ID: Nanetta Batty OB: [birth year]  MR#: [id number]  XKG#:[id number]  Patient Care Team: Glean Hess, MD as PCP - General (Family Medicine) Algernon Huxley, MD as Referring Physician (Vascular Surgery) Glean Hess, MD (Family Medicine)  CHIEF COMPLAINT:  Multiple myeloma.  INTERVAL HISTORY: Patient returns to clinic today for laboratory work, further evaluation, and continuation of Zometa.  He continues to have weakness and fatigue.  He now has left shoulder pain secondary to moving furniture approximately one week ago. He also continues to have back pain. He has no neurologic complaints.  He denies any abdominal pain.  He denies any chest pain or shortness of breath.  He denies any fevers, chills, or night sweats.  He denies any nausea, vomiting, constipation, or diarrhea.  He denies any easy bleeding or bruising.  Patient offers no further specific complaints today.  REVIEW OF SYSTEMS:   Review of Systems  Constitutional: Positive for malaise/fatigue. Negative for fever.  Respiratory: Negative.   Cardiovascular: Negative.   Gastrointestinal: Negative.   Musculoskeletal: Positive for back pain and joint pain.  Neurological: Positive for weakness.    As per HPI. Otherwise, a complete review of systems is negatve.  PAST MEDICAL HISTORY: Past Medical History  Diagnosis Date  . Diabetes mellitus without complication   . Cancer   . DVT (deep venous thrombosis)   . Hypertension   . COPD (chronic obstructive pulmonary disease)   . Diabetes mellitus without complication     Patient states he takes Glimeperide  . Plasmocytoma 2004  . Multiple myeloma     PAST SURGICAL HISTORY: Past Surgical History  Procedure Laterality Date  . Peripheral vascular catheterization N/A 08/22/2014    Procedure: Glori Luis Cath Insertion;  Surgeon: Katha Cabal, MD;  Location: [facility];  Service:  Cardiovascular;  Laterality: N/A;  . Limbal stem cell transplant      Multiple Myleoma  . Tonsillectomy    . 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 Cataract Right   . Ileostomy closure  2012    FAMILY HISTORY:  Unchanged. No report of malignancy or other chronic disease.     ADVANCED DIRECTIVES:    HEALTH MAINTENANCE: Social History  Substance Use Topics  . Smoking status: Current Every Day Smoker -- 0.50 packs/day for 50 years    Types: Cigarettes  . Smokeless tobacco: Never Used  . Alcohol Use: No     Colonoscopy:  PAP:  Bone density:  Lipid panel:  No Known Allergies  Current Outpatient Prescriptions  Medication Sig Dispense Refill  . albuterol (PROVENTIL HFA;VENTOLIN HFA) 108 (90 BASE) MCG/ACT inhaler Inhale 2 puffs into the lungs every 6 (six) hours as needed for wheezing or shortness of breath. 3 Inhaler 3  . azelastine (ASTELIN) 0.1 % nasal spray Place 2 sprays into both nostrils 2 (two) times daily. Use in each nostril as directed 30 mL 0  . baclofen (LIORESAL) 10 MG tablet Take 1 tablet (10 mg total) by mouth 3 (three) times daily as needed for muscle spasms. 270 each 1  . dexamethasone (DECADRON) 4 MG tablet TAKE 5 TABLETS ONE TIME WEEKLY ON MONDAY  60 tablet 5  . fluticasone (FLONASE) 50 MCG/ACT nasal spray Place 2 sprays into both nostrils daily as needed for rhinitis.    Marland Kitchen gabapentin (NEURONTIN) 300 MG capsule Take 300 mg by mouth 2 (two) times daily.    Marland Kitchen  glimepiride (AMARYL) 2 MG tablet Take 2 mg by mouth daily with breakfast.    . ipratropium-albuterol (DUONEB) 0.5-2.5 (3) MG/3ML SOLN Take 3 mLs by nebulization every 6 (six) hours as needed. 360 mL 3  . levalbuterol (XOPENEX HFA) 45 MCG/ACT inhaler Inhale into the lungs every 4 (four) hours as needed for wheezing.    . lidocaine-prilocaine (EMLA) cream Apply 1 application topically as needed. Apply to port then cover with saran wrap 1-2 hours before appointment 30 g 2  . Melatonin 5 MG  TABS Take 1 tablet by mouth at bedtime as needed (for sleep).    . morphine (MS CONTIN) 15 MG 12 hr tablet Take 1 tablet (15 mg total) by mouth every 8 (eight) hours. 90 tablet 0  . omeprazole (PRILOSEC) 20 MG capsule Take 20 mg by mouth daily as needed (for indigestion/heartburn).     . prednisoLONE acetate (PRED FORTE) 1 % ophthalmic suspension Place 1 drop into the right eye 2 (two) times daily.    . prochlorperazine (COMPAZINE) 10 MG tablet Take 10 mg by mouth every 6 (six) hours as needed for nausea or vomiting.    . valACYclovir (VALTREX) 500 MG tablet Take 500 mg by mouth daily.    . baclofen (LIORESAL) 10 MG tablet Take 10 mg by mouth 3 (three) times daily.    . diazepam (VALIUM) 2 MG tablet Take 1 tablet (2 mg total) by mouth at bedtime as needed for anxiety. 30 tablet 1  . ibuprofen (ADVIL,MOTRIN) 800 MG tablet TAKE 1 TABLET THREE TIMES DAILY 270 tablet 3  . oxyCODONE (OXY IR/ROXICODONE) 5 MG immediate release tablet Take 1 tablet (5 mg total) by mouth every 4 (four) hours as needed for severe pain. 30 tablet 0   No current facility-administered medications for this visit.    OBJECTIVE: Filed Vitals:   01/22/15 1459  BP: 138/79  Pulse: 90  Temp: 97.9 F (36.6 C)  Resp: 18     Body mass index is 23.57 kg/(m^2).    ECOG FS:0 - Asymptomatic  General: Well-developed, well-nourished, no acute distress. Eyes: anicteric sclera. Lungs: Clear to auscultation bilaterally. Heart: Regular rate and rhythm. No rubs, murmurs, or gallops. Abdomen: Soft, nontender, nondistended. No organomegaly noted, normoactive bowel sounds. Musculoskeletal: No edema, cyanosis, or clubbing. Neuro: Alert, answering all questions appropriately. Cranial nerves grossly intact. Skin: No rashes or petechiae noted. Psych: Normal affect.    LAB RESULTS:  Lab Results  Component Value Date   NA 136 01/22/2015   K 3.3* 01/22/2015   CL 105 01/22/2015   CO2 26 01/22/2015   GLUCOSE 149* 01/22/2015   BUN 29*  01/22/2015   CREATININE 0.83 01/22/2015   CALCIUM 9.5 01/22/2015   PROT 5.9* 08/15/2014   ALBUMIN 2.9* 08/15/2014   AST 27 08/15/2014   ALT 24 08/15/2014   ALKPHOS 83 08/15/2014   BILITOT 0.7 08/15/2014   GFRNONAA >60 01/22/2015   GFRAA >60 01/22/2015    Lab Results  Component Value Date   WBC 2.3* 01/22/2015   NEUTROABS 0.9* 01/22/2015   HGB 12.1* 01/22/2015   HCT 35.5* 01/22/2015   MCV 93.9 01/22/2015   PLT 161 01/22/2015     STUDIES: No results found.  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  PLAN:    1.  Multiple myeloma: Continue to hold Revlimid at this time. His M spike seems to be trending back up and is now 1.0. Continue 81  mg aspirin daily. Proceed with Zometa today. Patient has asked for less frequent visits, therefore return to clinic in six weeks for laboratory work and continuation of Zometa. Patient now has a port. 2.  History of DVT/PE: He is no longer taking Coumadin. Continue 81 mg aspirin. 3   Pain: Continue current narcotic regimen. XRT has completed. 4.  Hypokalemia: Resolved, continue to monitor.   5.  Diarrhea: Patient has been instructed to use OTC Imodium as needed. 6.  Back pain: Chronic.  Continue ibuprofen as needed.  7.  Kidney mass: Highly suspicious for underlying malignancy.  Patient had evaluation by urology and opted for surveillance with repeat CT scan in January 2017. 8.  Shoulder pain: Patient previously was complaining of right shoulder pain, he now has left shoulder pain. Possibly musculoskeletal nature but given his increasing M spike, will monitor closely. 9.  Leukopenia: Mild, possibly secondary to progression of disease. Monitor.    Patient expressed understanding and was in agreement with this plan. He also understands that He can call clinic at any time with any questions, concerns, or complaints.    Lloyd Huger, MD   02/10/2015 8:01  AM

## 2015-02-16 ENCOUNTER — Other Ambulatory Visit: Payer: Self-pay | Admitting: *Deleted

## 2015-02-16 ENCOUNTER — Encounter: Payer: Self-pay | Admitting: Internal Medicine

## 2015-02-16 ENCOUNTER — Ambulatory Visit (INDEPENDENT_AMBULATORY_CARE_PROVIDER_SITE_OTHER): Payer: Commercial Managed Care - HMO | Admitting: Internal Medicine

## 2015-02-16 VITALS — BP 122/68 | HR 74 | Ht 70.0 in | Wt 164.6 lb

## 2015-02-16 DIAGNOSIS — E119 Type 2 diabetes mellitus without complications: Secondary | ICD-10-CM

## 2015-02-16 DIAGNOSIS — R222 Localized swelling, mass and lump, trunk: Secondary | ICD-10-CM | POA: Diagnosis not present

## 2015-02-16 DIAGNOSIS — J438 Other emphysema: Secondary | ICD-10-CM

## 2015-02-16 DIAGNOSIS — C9002 Multiple myeloma in relapse: Secondary | ICD-10-CM | POA: Diagnosis not present

## 2015-02-16 NOTE — Progress Notes (Signed)
Date:  02/16/2015   Name:  Russell Keller   DOB:  1945-12-07   MRN:  [id number]   Chief Complaint: Lump On Chest Diabetes He presents for his follow-up diabetic visit. He has type 2 diabetes mellitus. His disease course has been stable. Pertinent negatives for diabetes include no chest pain and no polyuria. There are no diabetic complications. Current diabetic treatment includes oral agent (monotherapy). He is compliant with treatment most of the time. His weight is stable. His breakfast blood glucose is taken between 8-9 am. His breakfast blood glucose range is generally 110-130 mg/dl.   COPD - He reports breathing issues are stable. He has noticed that if he twists or breathes deeply he will have a popping sensation in his lower ribs posteriorly. There is no pain associated with this but he is wondering what is causing. He's had no recent infections although several family members are ill and he is concerned about contracting there are infection.  Chest wall mass - Several days ago noticed swelling in his right upper chest near his collarbone. The mass itself is not tender at the joint just above is slightly tender to palpation. He does not recall a fracture or injury to the area. He does have multiple myeloma and is followed closely by oncology. We discussed an x-ray today but he'll wait until his oncology visit in 2 weeks.   Review of Systems  Constitutional: Negative for fever and chills.  HENT: Negative for trouble swallowing.   Respiratory: Positive for cough and shortness of breath. Negative for chest tightness and stridor.   Cardiovascular: Negative for chest pain and palpitations.  Endocrine: Negative for polyuria.  Musculoskeletal: Positive for myalgias and arthralgias. Negative for back pain.    Patient Active Problem List   Diagnosis Date Noted  . Renal mass, left 08/10/2014  . Hypokalemia 08/10/2014  . Acute respiratory failure ([place]) 08/03/2014  . PNA (pneumonia)  08/02/2014  . Benign prostatic hypertrophy without urinary obstruction 07/15/2014  . Current smoker 07/15/2014  . Arthralgia of hip 07/15/2014  . Acquired polyneuropathy ([place]) 07/15/2014  . Chronic obstructive pulmonary emphysema ([place]) 07/15/2014  . CA of skin 07/15/2014  . Muscle spasm 07/13/2014  . Community acquired pneumonia 06/17/2014  . Uncomplicated type 2 diabetes mellitus ([place]) 06/17/2014  . Essential hypertension 06/17/2014  . DVT (deep venous thrombosis) ([place])   . Plasmocytoma ([place])   . Multiple myeloma in relapse ([place]) 06/04/2014    Prior to Admission medications   Medication Sig Start Date End Date Taking? Authorizing Provider  baclofen (LIORESAL) 10 MG tablet Take 1 tablet (10 mg total) by mouth 3 (three) times daily as needed for muscle spasms. 10/06/14  Yes Lloyd Huger, MD  diazepam (VALIUM) 2 MG tablet Take 1 tablet (2 mg total) by mouth at bedtime as needed for anxiety. 02/07/15  Yes Lloyd Huger, MD  gabapentin (NEURONTIN) 300 MG capsule Take 300 mg by mouth 2 (two) times daily.   Yes Historical Provider, MD  glimepiride (AMARYL) 2 MG tablet Take 2 mg by mouth daily with breakfast.   Yes Historical Provider, MD  ibuprofen (ADVIL,MOTRIN) 800 MG tablet TAKE 1 TABLET THREE TIMES DAILY 02/07/15  Yes Lloyd Huger, MD  ipratropium-albuterol (DUONEB) 0.5-2.5 (3) MG/3ML SOLN Take 3 mLs by nebulization every 6 (six) hours as needed. 08/04/14  Yes Aldean Jewett, MD  levalbuterol [facility] HFA) 45 MCG/ACT inhaler Inhale into the lungs every 4 (four) hours as needed for wheezing.  Yes Historical Provider, MD  lidocaine-prilocaine (EMLA) cream Apply 1 application topically as needed. Apply to port then cover with saran wrap 1-2 hours before appointment 10/09/14  Yes Lloyd Huger, MD  Melatonin 5 MG TABS Take 1 tablet by mouth at bedtime as needed (for sleep).   Yes Historical Provider, MD  morphine (MS CONTIN) 15 MG 12 hr tablet Take 1 tablet (15 mg total)  by mouth every 8 (eight) hours. 01/22/15  Yes Lloyd Huger, MD  omeprazole (PRILOSEC) 20 MG capsule Take 20 mg by mouth daily as needed (for indigestion/heartburn).    Yes Historical Provider, MD  oxyCODONE (OXY IR/ROXICODONE) 5 MG immediate release tablet Take 1 tablet (5 mg total) by mouth every 4 (four) hours as needed for severe pain. 02/06/15  Yes Lloyd Huger, MD  prednisoLONE acetate (PRED FORTE) 1 % ophthalmic suspension Place 1 drop into the right eye 2 (two) times daily.   Yes Historical Provider, MD  valACYclovir (VALTREX) 500 MG tablet Take 500 mg by mouth daily.   Yes Historical Provider, MD  albuterol (PROVENTIL HFA;VENTOLIN HFA) 108 (90 BASE) MCG/ACT inhaler Inhale 2 puffs into the lungs every 6 (six) hours as needed for wheezing or shortness of breath. Patient not taking: Reported on 02/16/2015 08/29/14   Glean Hess, MD  azelastine (ASTELIN) 0.1 % nasal spray Place 2 sprays into both nostrils 2 (two) times daily. Use in each nostril as directed Patient not taking: Reported on 02/16/2015 08/10/14   Glean Hess, MD  dexamethasone (DECADRON) 4 MG tablet TAKE 5 TABLETS ONE TIME WEEKLY ON MONDAY  Patient not taking: Reported on 02/16/2015 07/11/14   Lloyd Huger, MD  fluticasone [facility]) 50 MCG/ACT nasal spray Place 2 sprays into both nostrils daily as needed for rhinitis. Reported on 02/16/2015    Historical Provider, MD  prochlorperazine (COMPAZINE) 10 MG tablet Take 10 mg by mouth every 6 (six) hours as needed for nausea or vomiting. Reported on 02/16/2015    Historical Provider, MD    No Known Allergies  Past Surgical History  Procedure Laterality Date  . Peripheral vascular catheterization N/A 08/22/2014    Procedure: Glori Luis Cath Insertion;  Surgeon: Katha Cabal, MD;  Location: [facility];  Service: Cardiovascular;  Laterality: N/A;  . Limbal stem cell transplant      Multiple Myleoma  . Tonsillectomy    . Colon, bladder fistula repair  2012     with ileostomy  . Cataract Right   . Ileostomy closure  2012    Social History  Substance Use Topics  . Smoking status: Current Every Day Smoker -- 0.50 packs/day for 50 years    Types: Cigarettes  . Smokeless tobacco: Never Used  . Alcohol Use: No    Medication list has been reviewed and updated.  Physical Exam  Constitutional: He is oriented to person, place, and time. No distress.  Neck: No thyromegaly present.  Cardiovascular: Normal rate, regular rhythm and normal heart sounds.   Pulmonary/Chest: Effort normal. He has decreased breath sounds. He has no wheezes. He has no rhonchi. He exhibits mass. He exhibits no deformity and no retraction.    Lymphadenopathy:    He has no cervical adenopathy.  Neurological: He is alert and oriented to person, place, and time.  Skin: Skin is warm, dry and intact.     Psychiatric: He has a normal mood and affect.  Nursing note and vitals reviewed.   BP 122/68 mmHg  Pulse 74  Ht 5' 10"  (1.778 m)  Wt 164 lb 9.6 oz (74.662 kg)  BMI 23.62 kg/m2  Assessment and Plan: 1. Chest wall mass Patient is offered x-ray today but he wants to wait until he sees his oncologist later this month  2. Multiple myeloma in relapse [facility]) Follow-up with oncology  3. Other emphysema ([place]) Stable on current regimen Respiratory contact precautions are discussed  4. Type 2 diabetes mellitus without complication, without long-term current use of insulin ([place]) Recent glucoses have been well controlled Will ask oncology to obtain an A1c at next visit   Halina Maidens, MD [place]  02/16/2015

## 2015-02-20 ENCOUNTER — Other Ambulatory Visit: Payer: Self-pay | Admitting: *Deleted

## 2015-02-20 MED ORDER — OXYCODONE HCL 5 MG PO TABS: 5.0000 mg | ORAL_TABLET | ORAL | Status: AC | PRN

## 2015-02-20 NOTE — Telephone Encounter (Signed)
Asking for a thirty day supply instead of a 10 day supply Last got #30 on 12/27

## 2015-02-21 ENCOUNTER — Telehealth: Payer: Self-pay | Admitting: *Deleted

## 2015-02-21 NOTE — Telephone Encounter (Signed)
Appt tomorrow per pt request

## 2015-02-22 ENCOUNTER — Inpatient Hospital Stay: Payer: Commercial Managed Care - HMO | Attending: Oncology | Admitting: Oncology

## 2015-02-22 ENCOUNTER — Other Ambulatory Visit: Payer: Self-pay

## 2015-02-22 VITALS — BP 111/69 | HR 89 | Temp 96.1°F | Wt 168.2 lb

## 2015-02-22 DIAGNOSIS — M25511 Pain in right shoulder: Secondary | ICD-10-CM | POA: Diagnosis not present

## 2015-02-22 DIAGNOSIS — R590 Localized enlarged lymph nodes: Secondary | ICD-10-CM | POA: Diagnosis not present

## 2015-02-22 DIAGNOSIS — R197 Diarrhea, unspecified: Secondary | ICD-10-CM | POA: Diagnosis not present

## 2015-02-22 DIAGNOSIS — E876 Hypokalemia: Secondary | ICD-10-CM | POA: Diagnosis not present

## 2015-02-22 DIAGNOSIS — D72819 Decreased white blood cell count, unspecified: Secondary | ICD-10-CM | POA: Diagnosis not present

## 2015-02-22 DIAGNOSIS — F17210 Nicotine dependence, cigarettes, uncomplicated: Secondary | ICD-10-CM | POA: Insufficient documentation

## 2015-02-22 DIAGNOSIS — C9000 Multiple myeloma not having achieved remission: Secondary | ICD-10-CM | POA: Diagnosis present

## 2015-02-22 DIAGNOSIS — Z79899 Other long term (current) drug therapy: Secondary | ICD-10-CM | POA: Diagnosis not present

## 2015-02-22 DIAGNOSIS — R0781 Pleurodynia: Secondary | ICD-10-CM | POA: Diagnosis not present

## 2015-02-22 DIAGNOSIS — M549 Dorsalgia, unspecified: Secondary | ICD-10-CM | POA: Diagnosis not present

## 2015-02-22 DIAGNOSIS — R0602 Shortness of breath: Secondary | ICD-10-CM

## 2015-02-22 DIAGNOSIS — Z86718 Personal history of other venous thrombosis and embolism: Secondary | ICD-10-CM | POA: Insufficient documentation

## 2015-02-22 DIAGNOSIS — N2889 Other specified disorders of kidney and ureter: Secondary | ICD-10-CM | POA: Diagnosis not present

## 2015-02-22 MED ORDER — MORPHINE SULFATE ER 15 MG PO TBCR: 15.0000 mg | EXTENDED_RELEASE_TABLET | Freq: Three times a day (TID) | ORAL | Status: AC

## 2015-02-23 ENCOUNTER — Other Ambulatory Visit: Payer: Self-pay | Admitting: Oncology

## 2015-02-23 NOTE — Progress Notes (Signed)
[place]  Telephone:(336) [phone] Fax:(336) [phone]  ID: Nanetta Batty OB: [birth year]  MR#: [id number]  UUE#:[id number]  Patient Care Team: Glean Hess, MD as PCP - General (Family Medicine) Algernon Huxley, MD as Referring Physician (Vascular Surgery) Glean Hess, MD (Family Medicine)  CHIEF COMPLAINT:  Multiple myeloma.  INTERVAL HISTORY: Patient returns to clinic today as an add-on with complaints of a new lump to the right of the sternum as well as increasing weakness and fatigue, shortness of breath, and pleuritic chest pain.for  He also continues to have back pain and left shoulder pain. He has no neurologic complaints.  He denies any abdominal pain. He denies any fevers, chills, or night sweats.  He denies any nausea, vomiting, constipation, or diarrhea.  He denies any easy bleeding or bruising.  Patient offers no further specific complaints today.  REVIEW OF SYSTEMS:   Review of Systems  Constitutional: Positive for malaise/fatigue. Negative for fever.  Respiratory: Positive for shortness of breath. Negative for cough and hemoptysis.   Cardiovascular: Positive for chest pain.  Gastrointestinal: Negative.   Musculoskeletal: Positive for back pain and joint pain.  Neurological: Positive for weakness.    As per HPI. Otherwise, a complete review of systems is negatve.  PAST MEDICAL HISTORY: Past Medical History  Diagnosis Date  . Diabetes mellitus without complication ([place])   . Cancer ([place])   . DVT (deep venous thrombosis) ([place])   . Hypertension   . COPD (chronic obstructive pulmonary disease) ([place])   . Diabetes mellitus without complication [facility])     Patient states he takes Glimeperide  . Plasmocytoma ([place]) 2004  . Multiple myeloma ([place])     PAST SURGICAL HISTORY: Past Surgical History  Procedure Laterality Date  . Peripheral vascular catheterization N/A 08/22/2014    Procedure: Glori Luis Cath Insertion;  Surgeon: Katha Cabal,  MD;  Location: [facility];  Service: Cardiovascular;  Laterality: N/A;  . Limbal stem cell transplant      Multiple Myleoma  . Tonsillectomy    . 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 Cataract Right   . Ileostomy closure  2012    FAMILY HISTORY:  Unchanged. No report of malignancy or other chronic disease.     ADVANCED DIRECTIVES:    HEALTH MAINTENANCE: Social History  Substance Use Topics  . Smoking status: Current Every Day Smoker -- 0.50 packs/day for 50 years    Types: Cigarettes  . Smokeless tobacco: Never Used  . Alcohol Use: No     Colonoscopy:  PAP:  Bone density:  Lipid panel:  No Known Allergies  Current Outpatient Prescriptions  Medication Sig Dispense Refill  . albuterol (PROVENTIL HFA;VENTOLIN HFA) 108 (90 BASE) MCG/ACT inhaler Inhale 2 puffs into the lungs every 6 (six) hours as needed for wheezing or shortness of breath. 3 Inhaler 3  . azelastine (ASTELIN) 0.1 % nasal spray Place 2 sprays into both nostrils 2 (two) times daily. Use in each nostril as directed 30 mL 0  . baclofen (LIORESAL) 10 MG tablet Take 1 tablet (10 mg total) by mouth 3 (three) times daily as needed for muscle spasms. 270 each 1  . dexamethasone (DECADRON) 4 MG tablet TAKE 5 TABLETS ONE TIME WEEKLY ON MONDAY  60 tablet 5  . diazepam (VALIUM) 2 MG tablet Take 1 tablet (2 mg total) by mouth at bedtime as needed for anxiety. 30 tablet 1  . fluticasone (FLONASE) 50 MCG/ACT nasal  spray Place 2 sprays into both nostrils daily as needed for rhinitis. Reported on 02/16/2015    . gabapentin (NEURONTIN) 300 MG capsule Take 300 mg by mouth 2 (two) times daily.    Marland Kitchen glimepiride (AMARYL) 2 MG tablet Take 2 mg by mouth daily with breakfast.    . ibuprofen (ADVIL,MOTRIN) 800 MG tablet TAKE 1 TABLET THREE TIMES DAILY 270 tablet 3  . ipratropium-albuterol (DUONEB) 0.5-2.5 (3) MG/3ML SOLN Take 3 mLs by nebulization every 6 (six) hours as needed. 360 mL 3  . levalbuterol (XOPENEX  HFA) 45 MCG/ACT inhaler Inhale into the lungs every 4 (four) hours as needed for wheezing.    . lidocaine-prilocaine (EMLA) cream Apply 1 application topically as needed. Apply to port then cover with saran wrap 1-2 hours before appointment 30 g 2  . Melatonin 5 MG TABS Take 1 tablet by mouth at bedtime as needed (for sleep).    Marland Kitchen omeprazole (PRILOSEC) 20 MG capsule Take 20 mg by mouth daily as needed (for indigestion/heartburn).     Marland Kitchen oxyCODONE (OXY IR/ROXICODONE) 5 MG immediate release tablet Take 1 tablet (5 mg total) by mouth every 4 (four) hours as needed for severe pain. 60 tablet 0  . prednisoLONE acetate (PRED FORTE) 1 % ophthalmic suspension Place 1 drop into the right eye 2 (two) times daily.    . prochlorperazine (COMPAZINE) 10 MG tablet Take 10 mg by mouth every 6 (six) hours as needed for nausea or vomiting. Reported on 02/16/2015    . valACYclovir (VALTREX) 500 MG tablet Take 500 mg by mouth daily.    Marland Kitchen morphine (MS CONTIN) 15 MG 12 hr tablet Take 1 tablet (15 mg total) by mouth every 8 (eight) hours. 90 tablet 0   No current facility-administered medications for this visit.    OBJECTIVE: Filed Vitals:   02/22/15 0937  BP: 111/69  Pulse: 89  Temp: 96.1 F (35.6 C)     Body mass index is 24.14 kg/(m^2).    ECOG FS:1 - Symptomatic but completely ambulatory  General: Well-developed, well-nourished, no acute distress. Eyes: anicteric sclera. Chest wall: Easily palpable 1-2 cm cystic like mass to the right of sternum. Lungs: Clear to auscultation bilaterally. Heart: Regular rate and rhythm. No rubs, murmurs, or gallops. Abdomen: Soft, nontender, nondistended. No organomegaly noted, normoactive bowel sounds. Musculoskeletal: No edema, cyanosis, or clubbing. Neuro: Alert, answering all questions appropriately. Cranial nerves grossly intact. Skin: No rashes or petechiae noted. Psych: Normal affect.    LAB RESULTS:  Lab Results  Component Value Date   NA 136 01/22/2015   K  3.3* 01/22/2015   CL 105 01/22/2015   CO2 26 01/22/2015   GLUCOSE 149* 01/22/2015   BUN 29* 01/22/2015   CREATININE 0.83 01/22/2015   CALCIUM 9.5 01/22/2015   PROT 5.9* 08/15/2014   ALBUMIN 2.9* 08/15/2014   AST 27 08/15/2014   ALT 24 08/15/2014   ALKPHOS 83 08/15/2014   BILITOT 0.7 08/15/2014   GFRNONAA >60 01/22/2015   GFRAA >60 01/22/2015    Lab Results  Component Value Date   WBC 2.3* 01/22/2015   NEUTROABS 0.9* 01/22/2015   HGB 12.1* 01/22/2015   HCT 35.5* 01/22/2015   MCV 93.9 01/22/2015   PLT 161 01/22/2015     STUDIES: No results found.  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  PLAN:    1.  Multiple myeloma: Continue to hold Revlimid at this time. His M spike  seems to be trending back up and is now 1.0. The new area on his chest wall is also concerning for a plasmacytoma, therefore will get a chest CT to further evaluate. Continue 81 mg aspirin daily. Return to clinic one to 2 days after his CT scan to discuss the results. Patient now has a port. 2.  History of DVT/PE: He is no longer taking Coumadin. Continue 81 mg aspirin. Given his shortness of breath and pleuritic chest pain we will get PE protocol CT as above. 3   Pain: Continue current narcotic regimen. XRT has completed. 4.  Hypokalemia: Resolved, continue to monitor.   5.  Diarrhea: Patient has been instructed to use OTC Imodium as needed. 6.  Back pain: Chronic.  Continue ibuprofen as needed.  7.  Kidney mass: Highly suspicious for underlying malignancy.  Patient had evaluation by urology and opted for surveillance with repeat CT scan in January 2017. 8.  Shoulder pain: Patient previously was complaining of right shoulder pain, he now has left shoulder pain. Possibly musculoskeletal nature but given his increasing M spike, will monitor closely. 9.  Leukopenia: Mild, possibly secondary to progression of disease. Monitor.  10.  Shortness of breath/pleuritic chest pain: PE protocol CT as above.   Patient expressed understanding and was in agreement with this plan. He also understands that He can call clinic at any time with any questions, concerns, or complaints.    Lloyd Huger, MD   02/23/2015 11:37 AM

## 2015-02-25 ENCOUNTER — Emergency Department
Admission: EM | Admit: 2015-02-25 | Discharge: 2015-02-25 | Disposition: A | Discharge status: Home / Self Care | Payer: Commercial Managed Care - HMO | Attending: Emergency Medicine | Admitting: Emergency Medicine

## 2015-02-25 ENCOUNTER — Other Ambulatory Visit: Payer: Self-pay

## 2015-02-25 ENCOUNTER — Emergency Department: Payer: Commercial Managed Care - HMO

## 2015-02-25 DIAGNOSIS — J441 Chronic obstructive pulmonary disease with (acute) exacerbation: Secondary | ICD-10-CM | POA: Diagnosis not present

## 2015-02-25 DIAGNOSIS — F17210 Nicotine dependence, cigarettes, uncomplicated: Secondary | ICD-10-CM | POA: Insufficient documentation

## 2015-02-25 DIAGNOSIS — E119 Type 2 diabetes mellitus without complications: Secondary | ICD-10-CM | POA: Insufficient documentation

## 2015-02-25 DIAGNOSIS — Z79899 Other long term (current) drug therapy: Secondary | ICD-10-CM | POA: Diagnosis not present

## 2015-02-25 DIAGNOSIS — I10 Essential (primary) hypertension: Secondary | ICD-10-CM | POA: Diagnosis not present

## 2015-02-25 DIAGNOSIS — R0602 Shortness of breath: Secondary | ICD-10-CM | POA: Diagnosis present

## 2015-02-25 LAB — CBC
HCT: 29.6 % — ABNORMAL LOW (ref 40.0–52.0)
Hemoglobin: 9.9 g/dL — ABNORMAL LOW (ref 13.0–18.0)
MCH: 30.6 pg (ref 26.0–34.0)
MCHC: 33.3 g/dL (ref 32.0–36.0)
MCV: 91.9 fL (ref 80.0–100.0)
PLATELETS: 159 10*3/uL (ref 150–440)
RBC: 3.23 MIL/uL — AB (ref 4.40–5.90)
RDW: 14.4 % (ref 11.5–14.5)
WBC: 2.9 10*3/uL — AB (ref 3.8–10.6)

## 2015-02-25 LAB — COMPREHENSIVE METABOLIC PANEL
ALBUMIN: 3.3 g/dL — AB (ref 3.5–5.0)
ALT: 17 U/L (ref 17–63)
AST: 26 U/L (ref 15–41)
Alkaline Phosphatase: 78 U/L (ref 38–126)
Anion gap: 4 — ABNORMAL LOW (ref 5–15)
BUN: 24 mg/dL — AB (ref 6–20)
CHLORIDE: 103 mmol/L (ref 101–111)
CO2: 30 mmol/L (ref 22–32)
CREATININE: 1.08 mg/dL (ref 0.61–1.24)
Calcium: 10.9 mg/dL — ABNORMAL HIGH (ref 8.9–10.3)
GFR calc Af Amer: >60 mL/min (ref >60)
GLUCOSE: 100 mg/dL — AB (ref 65–99)
Potassium: 3.3 mmol/L — ABNORMAL LOW (ref 3.5–5.1)
Sodium: 137 mmol/L (ref 135–145)
TOTAL PROTEIN: 6.8 g/dL (ref 6.5–8.1)
Total Bilirubin: 0.4 mg/dL (ref 0.3–1.2)

## 2015-02-25 LAB — TROPONIN I

## 2015-02-25 MED ORDER — PREDNISONE 20 MG PO TABS: 40.0000 mg | ORAL_TABLET | Freq: Every day | ORAL | Status: DC

## 2015-02-25 MED ORDER — IPRATROPIUM-ALBUTEROL 0.5-2.5 (3) MG/3ML IN SOLN
3.0000 mL | Freq: Once | RESPIRATORY_TRACT | Status: AC
  Administered 2015-02-25: 3 mL via RESPIRATORY_TRACT
  Filled 2015-02-25: qty 3

## 2015-02-25 MED ORDER — METHYLPREDNISOLONE SODIUM SUCC 125 MG IJ SOLR
125.0000 mg | Freq: Once | INTRAMUSCULAR | Status: AC
  Administered 2015-02-25: 125 mg via INTRAVENOUS
  Filled 2015-02-25: qty 2

## 2015-02-25 NOTE — ED Notes (Signed)
96% on room air

## 2015-02-25 NOTE — Discharge Instructions (Signed)

## 2015-02-25 NOTE — ED Notes (Signed)
Pt reports for past couple of weeks SOB. Pain in ribs with taking a deep breath Pt has history of COPD and states he was getting 02 sat of 85% prior to coming in. 88% on room air in triage placed on oxygen 2L [place] increased to 99%.

## 2015-02-25 NOTE — ED Provider Notes (Signed)
[facility] Emergency Department Provider Note  Time seen: 2:30 PM  I have reviewed the triage vital signs and the nursing notes.   HISTORY  Chief Complaint Shortness of Breath    HPI Russell Keller is a 70 y.o. male with a past medical history of diabetes, multiple myeloma, hypertension, COPD, diabetes, who presents to the emergency department increased shortness of breath. According to the patient for the past 3-4 days he has been increasingly short of breath with wheeze. He has been using home treatments including nebulizer treatments without success.  Patient states he has been prescribed home O2 in the past that was years ago and he no longer uses it. He has no means of obtaining home O2 again. He saw his primary care doctor 5 days ago however at that time he was feeling well. Denies chest pain. Denies fever or recent cough/congestion.      Past Medical History  Diagnosis Date  . Diabetes mellitus without complication ([place])   . Cancer ([place])   . DVT (deep venous thrombosis) ([place])   . Hypertension   . COPD (chronic obstructive pulmonary disease) ([place])   . Diabetes mellitus without complication [facility])     Patient states he takes Glimeperide  . Plasmocytoma ([place]) 2004  . Multiple myeloma [facility])     Patient Active Problem List   Diagnosis Date Noted  . Type 2 diabetes mellitus without complication, without long-term current use of insulin ([place]) 02/16/2015  . Chest wall mass 02/16/2015  . Renal mass, left 08/10/2014  . Hypokalemia 08/10/2014  . Acute respiratory failure ([place]) 08/03/2014  . PNA (pneumonia) 08/02/2014  . Benign prostatic hypertrophy without urinary obstruction 07/15/2014  . Current smoker 07/15/2014  . Arthralgia of hip 07/15/2014  . Acquired polyneuropathy ([place]) 07/15/2014  . Chronic obstructive pulmonary emphysema ([place]) 07/15/2014  . CA of skin 07/15/2014  . Muscle spasm 07/13/2014  . Community acquired pneumonia 06/17/2014   . Essential hypertension 06/17/2014  . DVT (deep venous thrombosis) ([place])   . Plasmocytoma ([place])   . Multiple myeloma in relapse ([place]) 06/04/2014    Past Surgical History  Procedure Laterality Date  . Peripheral vascular catheterization N/A 08/22/2014    Procedure: Glori Luis Cath Insertion;  Surgeon: Katha Cabal, MD;  Location: [place] [facility];  Service: Cardiovascular;  Laterality: N/A;  . Limbal stem cell transplant      Multiple Myleoma  . Tonsillectomy    . 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 Cataract Right   . Ileostomy closure  2012    Current Outpatient Rx  Name  Route  Sig  Dispense  Refill  . albuterol (PROVENTIL HFA;VENTOLIN HFA) 108 (90 BASE) MCG/ACT inhaler   Inhalation   Inhale 2 puffs into the lungs every 6 (six) hours as needed for wheezing or shortness of breath.   3 Inhaler   3   . azelastine (ASTELIN) 0.1 % nasal spray   Each Nare   Place 2 sprays into both nostrils 2 (two) times daily. Use in each nostril as directed   30 mL   0   . baclofen (LIORESAL) 10 MG tablet      TAKE 1 TABLET THREE TIMES DAILY AS NEEDED FOR MUSCLE SPASM(S)   270 tablet   1   . dexamethasone (DECADRON) 4 MG tablet      TAKE 5 TABLETS ONE TIME WEEKLY ON MONDAY    60 tablet   5   .  diazepam (VALIUM) 2 MG tablet   Oral   Take 1 tablet (2 mg total) by mouth at bedtime as needed for anxiety.   30 tablet   1   . fluticasone (FLONASE) 50 MCG/ACT nasal spray   Each Nare   Place 2 sprays into both nostrils daily as needed for rhinitis. Reported on 02/16/2015         . gabapentin (NEURONTIN) 300 MG capsule   Oral   Take 300 mg by mouth 2 (two) times daily.         Marland Kitchen glimepiride (AMARYL) 2 MG tablet   Oral   Take 2 mg by mouth daily with breakfast.         . ibuprofen (ADVIL,MOTRIN) 800 MG tablet      TAKE 1 TABLET THREE TIMES DAILY   270 tablet   3   . ipratropium-albuterol (DUONEB) 0.5-2.5 (3) MG/3ML SOLN   Nebulization   Take 3  mLs by nebulization every 6 (six) hours as needed.   360 mL   3   . levalbuterol (XOPENEX HFA) 45 MCG/ACT inhaler   Inhalation   Inhale into the lungs every 4 (four) hours as needed for wheezing.         . lidocaine-prilocaine (EMLA) cream   Topical   Apply 1 application topically as needed. Apply to port then cover with saran wrap 1-2 hours before appointment   30 g   2   . Melatonin 5 MG TABS   Oral   Take 1 tablet by mouth at bedtime as needed (for sleep).         . morphine (MS CONTIN) 15 MG 12 hr tablet   Oral   Take 1 tablet (15 mg total) by mouth every 8 (eight) hours.   90 tablet   0   . omeprazole (PRILOSEC) 20 MG capsule   Oral   Take 20 mg by mouth daily as needed (for indigestion/heartburn).          Marland Kitchen oxyCODONE (OXY IR/ROXICODONE) 5 MG immediate release tablet   Oral   Take 1 tablet (5 mg total) by mouth every 4 (four) hours as needed for severe pain.   60 tablet   0   . prednisoLONE acetate (PRED FORTE) 1 % ophthalmic suspension   Right Eye   Place 1 drop into the right eye 2 (two) times daily.         . prochlorperazine (COMPAZINE) 10 MG tablet   Oral   Take 10 mg by mouth every 6 (six) hours as needed for nausea or vomiting. Reported on 02/16/2015         . valACYclovir (VALTREX) 500 MG tablet   Oral   Take 500 mg by mouth daily.           Allergies Review of patient's allergies indicates no known allergies.  Family History  Problem Relation Age of Onset  . Diabetes Mother   . Cancer Mother     Social History Social History  Substance Use Topics  . Smoking status: Current Every Day Smoker -- 0.50 packs/day for 50 years    Types: Cigarettes  . Smokeless tobacco: Never Used  . Alcohol Use: No    Review of Systems Constitutional: Negative for fever. Cardiovascular: Negative for chest pain. Respiratory: Positive for shortness of breath. Gastrointestinal: Negative for abdominal pain Neurological: Negative for  headache 10-point ROS otherwise negative.  ____________________________________________   PHYSICAL EXAM:  VITAL SIGNS: ED Triage Vitals  Enc  Vitals Group     BP 02/25/15 1308 107/54 mmHg     Pulse Rate 02/25/15 1308 83     Resp 02/25/15 1308 16     Temp 02/25/15 1308 98.1 F (36.7 C)     Temp Source 02/25/15 1308 Oral     SpO2 02/25/15 1308 88 %     Weight 02/25/15 1308 163 lb (73.936 kg)     Height 02/25/15 1308 5' 10"  (1.778 m)     Head Cir --      Peak Flow --      Pain Score 02/25/15 1306 6     Pain Loc --      Pain Edu? --      Excl. in [place]? --     Constitutional: Alert and oriented. Well appearing and in no distress. Eyes: Normal exam ENT   Head: Normocephalic and atraumatic.   Mouth/Throat: Mucous membranes are moist. Cardiovascular: Normal rate, regular rhythm. No murmur Respiratory: Normal respiratory rate. Decreased breath sounds bilateral lung fields. Moderate wheeze. Gastrointestinal: Soft and nontender. No distention. Musculoskeletal: Nontender with normal range of motion in all extremities. Neurologic:  Normal speech and language. No gross focal neurologic deficits  Skin:  Skin is warm, dry and intact.  Psychiatric: Mood and affect are normal. Speech and behavior are normal.   ____________________________________________    EKG  EKG reviewed and interpreted by myself shows what appears to be sinus rhythm at 88 bpm, widened QRS, normal axis, nonspecific ST changes. No ST elevations.  ____________________________________________    RADIOLOGY  Chest x-ray does not appear to show any acute abnormality  ____________________________________________    INITIAL IMPRESSION / ASSESSMENT AND PLAN / ED COURSE  Pertinent labs & imaging results that were available during my care of the patient were reviewed by me and considered in my medical decision making (see chart for details).  Patient presents with likely COPD exacerbation. Sats 92-95 percent  on room air however with the slightest exertion drops to 85% with a good waveform. We will begin DuoNeb's, Solu-Medrol, and check basic labs was a chest x-ray. Patient is quite hesitant to be admitted to the hospital for COPD, he believes he just needs and oxygen prescription and he will be fine. I discussed with the patient that we do not prescribe oxygen from the emergency department, and if he remains hypoxic after treatment he will likely require admission to the hospital.  Labs within normal limits, chest x-ray within normal limits.  Chest x-ray and labs are largely within normal limits. I discussed the plan of care for admission to the hospital given the patient continues to desat into the upper 80s with any exertion. The patient states he would much prefer to go home and follow up with his primary care physician tomorrow. He is currently 93% on room air while resting in bed. I again insisted that the patient be admitted, he much prefers to go home. Given his near-normal room air O2 saturation and believe this to be a reasonable option as long as he follows up tomorrow which she ensures me that he will. We have discussed return precautions. We will discharge her steroids and the patient is to be using home nebulizers every 4 hours.    ____________________________________________   FINAL CLINICAL IMPRESSION(S) / ED DIAGNOSES  Dyspnea COPD exacerbation   Harvest Dark, MD 02/25/15 [phone]

## 2015-02-26 ENCOUNTER — Encounter: Payer: Self-pay | Admitting: Internal Medicine

## 2015-02-26 ENCOUNTER — Ambulatory Visit (INDEPENDENT_AMBULATORY_CARE_PROVIDER_SITE_OTHER): Payer: Commercial Managed Care - HMO | Admitting: Internal Medicine

## 2015-02-26 VITALS — BP 118/60 | HR 114 | Ht 70.0 in | Wt 165.0 lb

## 2015-02-26 DIAGNOSIS — J438 Other emphysema: Secondary | ICD-10-CM | POA: Diagnosis not present

## 2015-02-26 DIAGNOSIS — E119 Type 2 diabetes mellitus without complications: Secondary | ICD-10-CM | POA: Diagnosis not present

## 2015-02-26 DIAGNOSIS — G5621 Lesion of ulnar nerve, right upper limb: Secondary | ICD-10-CM

## 2015-02-26 NOTE — Progress Notes (Signed)
Date:  02/26/2015   Name:  Russell Keller   DOB:  08-17-1945   MRN:  [id number]   Chief Complaint: Breathing Problem COPD Flare - seen last night at ER for low O2 sats - 88% - and shortness of breath.  He was given nebulizers and IM solumedrol.  He was sent home to continue nebulizers and to take a prednisone taper. He is improving but concerned that his O2 might drop again.  He had home O2 previously but turned it in several months ago.  Numbness - patient noted numbness in the fourth and fifth fingers on his right hand yesterday. He dropped a glass and thought it was because his oxygen was low but then discovered that his hand was actually numb. Denies any pain in the hand and no recent trauma.   Review of Systems  Constitutional: Positive for fatigue. Negative for fever and chills.  HENT: Negative for ear discharge, ear pain and nosebleeds.   Respiratory: Positive for shortness of breath. Negative for cough, chest tightness and wheezing.   Cardiovascular: Negative for chest pain and palpitations.  Gastrointestinal: Negative for abdominal pain.  Musculoskeletal: Positive for back pain, arthralgias and gait problem.  Skin: Negative for color change, pallor and rash.  Psychiatric/Behavioral: Negative for confusion and dysphoric mood.    Patient Active Problem List   Diagnosis Date Noted  . Type 2 diabetes mellitus without complication, without long-term current use of insulin ([place]) 02/16/2015  . Chest wall mass 02/16/2015  . Renal mass, left 08/10/2014  . Hypokalemia 08/10/2014  . Acute respiratory failure ([place]) 08/03/2014  . PNA (pneumonia) 08/02/2014  . Benign prostatic hypertrophy without urinary obstruction 07/15/2014  . Current smoker 07/15/2014  . Arthralgia of hip 07/15/2014  . Acquired polyneuropathy ([place]) 07/15/2014  . Chronic obstructive pulmonary emphysema ([place]) 07/15/2014  . CA of skin 07/15/2014  . Muscle spasm 07/13/2014  . Community acquired pneumonia  06/17/2014  . Essential hypertension 06/17/2014  . DVT (deep venous thrombosis) ([place])   . Plasmocytoma ([place])   . Multiple myeloma in relapse ([place]) 06/04/2014    Prior to Admission medications   Medication Sig Start Date End Date Taking? Authorizing Provider  albuterol (PROVENTIL HFA;VENTOLIN HFA) 108 (90 BASE) MCG/ACT inhaler Inhale 2 puffs into the lungs every 6 (six) hours as needed for wheezing or shortness of breath. 08/29/14  Yes Glean Hess, MD  azelastine (ASTELIN) 0.1 % nasal spray Place 2 sprays into both nostrils 2 (two) times daily. Use in each nostril as directed 08/10/14  Yes Glean Hess, MD  baclofen (LIORESAL) 10 MG tablet TAKE 1 TABLET THREE TIMES DAILY AS NEEDED FOR MUSCLE SPASM(S) 02/23/15  Yes Lloyd Huger, MD  dexamethasone (DECADRON) 4 MG tablet TAKE 5 TABLETS ONE TIME WEEKLY ON MONDAY  07/11/14  Yes Lloyd Huger, MD  diazepam (VALIUM) 2 MG tablet Take 1 tablet (2 mg total) by mouth at bedtime as needed for anxiety. 02/07/15  Yes Lloyd Huger, MD  fluticasone (FLONASE) 50 MCG/ACT nasal spray Place 2 sprays into both nostrils daily as needed for rhinitis. Reported on 02/16/2015   Yes Historical Provider, MD  gabapentin (NEURONTIN) 300 MG capsule Take 300 mg by mouth 2 (two) times daily.   Yes Historical Provider, MD  glimepiride (AMARYL) 2 MG tablet Take 2 mg by mouth daily with breakfast.   Yes Historical Provider, MD  ibuprofen (ADVIL,MOTRIN) 800 MG tablet TAKE 1 TABLET THREE TIMES DAILY 02/07/15  Yes Lloyd Huger,  MD  ipratropium-albuterol (DUONEB) 0.5-2.5 (3) MG/3ML SOLN Take 3 mLs by nebulization every 6 (six) hours as needed. 08/04/14  Yes Aldean Jewett, MD  levalbuterol [facility] HFA) 45 MCG/ACT inhaler Inhale into the lungs every 4 (four) hours as needed for wheezing.   Yes Historical Provider, MD  lidocaine-prilocaine (EMLA) cream Apply 1 application topically as needed. Apply to port then cover with saran wrap 1-2 hours before  appointment 10/09/14  Yes Lloyd Huger, MD  Melatonin 5 MG TABS Take 1 tablet by mouth at bedtime as needed (for sleep).   Yes Historical Provider, MD  morphine (MS CONTIN) 15 MG 12 hr tablet Take 1 tablet (15 mg total) by mouth every 8 (eight) hours. 02/22/15  Yes Lloyd Huger, MD  omeprazole (PRILOSEC) 20 MG capsule Take 20 mg by mouth daily as needed (for indigestion/heartburn).    Yes Historical Provider, MD  oxyCODONE (OXY IR/ROXICODONE) 5 MG immediate release tablet Take 1 tablet (5 mg total) by mouth every 4 (four) hours as needed for severe pain. 02/20/15  Yes Lloyd Huger, MD  prednisoLONE acetate (PRED FORTE) 1 % ophthalmic suspension Place 1 drop into the right eye 2 (two) times daily.   Yes Historical Provider, MD  predniSONE (DELTASONE) 20 MG tablet Take 2 tablets (40 mg total) by mouth daily. 02/25/15  Yes Harvest Dark, MD  prochlorperazine (COMPAZINE) 10 MG tablet Take 10 mg by mouth every 6 (six) hours as needed for nausea or vomiting. Reported on 02/16/2015   Yes Historical Provider, MD  valACYclovir (VALTREX) 500 MG tablet Take 500 mg by mouth daily.   Yes Historical Provider, MD    No Known Allergies  Past Surgical History  Procedure Laterality Date  . Peripheral vascular catheterization N/A 08/22/2014    Procedure: Glori Luis Cath Insertion;  Surgeon: Katha Cabal, MD;  Location: [place] [facility];  Service: Cardiovascular;  Laterality: N/A;  . Limbal stem cell transplant      Multiple Myleoma  . Tonsillectomy    . 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 Cataract Right   . Ileostomy closure  2012    Social History  Substance Use Topics  . Smoking status: Current Every Day Smoker -- 0.50 packs/day for 50 years    Types: Cigarettes  . Smokeless tobacco: Never Used  . Alcohol Use: No     Medication list has been reviewed and updated.   Physical Exam  Constitutional: He appears well-developed. He appears cachectic. He has a  sickly appearance. No distress.  Cardiovascular: Regular rhythm and normal heart sounds.   No extrasystoles are present. Tachycardia present.   Pulses:      Radial pulses are 1+ on the right side, and 1+ on the left side.  Circulation intact to right hand - radial and ulnar  Pulmonary/Chest: Accessory muscle usage present. No respiratory distress. He has decreased breath sounds. He has no wheezes. He has no rhonchi.  Neurological: He is alert. He has normal strength and normal reflexes. A sensory deficit (right 4th and 5th fingers) is present.    BP 118/60 mmHg  Pulse 105  Ht 5' 10"  (1.778 m)  Wt 165 lb (74.844 kg)  BMI 23.68 kg/m2  SpO2 93%  Assessment and Plan: 1. Other emphysema ([place]) O2 sat 88% in ER Lowest here with exertion 89% Will refer for Home O2  2. Ulnar tunnel syndrome of right wrist May improved with prednisone taper If persistent - will  refer to Ortho Hand specialist  3. Type 2 diabetes mellitus without complication, without long-term current use of insulin ([place]) Stable on current therapy   Halina Maidens, MD [facility]  02/26/2015

## 2015-02-28 ENCOUNTER — Ambulatory Visit
Admission: RE | Admit: 2015-02-28 | Discharge: 2015-02-28 | Disposition: A | Discharge status: Home / Self Care | Payer: Commercial Managed Care - HMO | Source: Ambulatory Visit | Attending: Oncology | Admitting: Oncology

## 2015-02-28 ENCOUNTER — Other Ambulatory Visit: Payer: Self-pay

## 2015-02-28 DIAGNOSIS — R071 Chest pain on breathing: Secondary | ICD-10-CM | POA: Diagnosis not present

## 2015-02-28 DIAGNOSIS — R918 Other nonspecific abnormal finding of lung field: Secondary | ICD-10-CM | POA: Insufficient documentation

## 2015-02-28 DIAGNOSIS — N2889 Other specified disorders of kidney and ureter: Secondary | ICD-10-CM

## 2015-02-28 DIAGNOSIS — J439 Emphysema, unspecified: Secondary | ICD-10-CM | POA: Diagnosis not present

## 2015-02-28 DIAGNOSIS — C9000 Multiple myeloma not having achieved remission: Secondary | ICD-10-CM | POA: Diagnosis not present

## 2015-02-28 DIAGNOSIS — I700 Atherosclerosis of aorta: Secondary | ICD-10-CM | POA: Insufficient documentation

## 2015-02-28 DIAGNOSIS — R590 Localized enlarged lymph nodes: Secondary | ICD-10-CM | POA: Diagnosis not present

## 2015-02-28 MED ORDER — IOHEXOL 350 MG/ML SOLN
100.0000 mL | Freq: Once | INTRAVENOUS | Status: AC | PRN
  Administered 2015-02-28: 100 mL via INTRAVENOUS

## 2015-03-01 ENCOUNTER — Other Ambulatory Visit: Payer: Self-pay | Admitting: Internal Medicine

## 2015-03-05 ENCOUNTER — Ambulatory Visit: Payer: Self-pay

## 2015-03-05 ENCOUNTER — Other Ambulatory Visit: Payer: Self-pay

## 2015-03-05 ENCOUNTER — Ambulatory Visit: Payer: Self-pay | Admitting: Oncology

## 2015-03-06 ENCOUNTER — Ambulatory Visit (INDEPENDENT_AMBULATORY_CARE_PROVIDER_SITE_OTHER): Payer: Commercial Managed Care - HMO | Admitting: Urology

## 2015-03-06 ENCOUNTER — Encounter: Payer: Self-pay | Admitting: Urology

## 2015-03-06 VITALS — BP 122/71 | HR 101 | Ht 69.0 in | Wt 158.8 lb

## 2015-03-06 DIAGNOSIS — N2889 Other specified disorders of kidney and ureter: Secondary | ICD-10-CM | POA: Diagnosis not present

## 2015-03-06 NOTE — Progress Notes (Addendum)
03/06/2015 8:34 AM   Russell Keller [birth year] [id number]  Referring provider: Glean Hess, MD [address] [place], [place] [address]  Chief Complaint  Patient presents with  . Follow-up    Results  . Mass    Left renal    HPI: Patient is a 70 year old Caucasian male with multiple co morbidities including relapsing multiple myeloma who presents today to discuss his CT scan results.  Previous history Patient was initially referred for evaluation of an incidental 2.8 cm left upper pole renal mass solid appearing enhancing renal mass.  Of note, the mass was seen on previous imaging back in 2012 described as 1.2 cm low attenuating renal lesion which was indeterminate on that particular scan button did not meet the criteria for simple cyst.  He does report a possible family history of renal cell carcinoma in his mother. He thinks his mother may have passed away in her 75's from metastatic RCC but is not 100% sure. She also had other malignancies including lung and liver cancer but is unclear from the history whether these were possibly metastatic lesions.  Today, he returns today for follow-up after dedicated CT abdomen  (renal mass protocol) performed on 03/10/2015 demonstrates a 3.5 cm homogeneous exophytic lesion in the left upper pole of the kidney with low grade enhancement. This is an increase from 2.5 cm on the previous exam in 07/2014.  No evidence of vein thrombosis.  Lytic and sclerotic lesions are seen in the axial and appendicular skeleton would could be related to the patient's history of multiple myeloma.    He denies any history of flank pain or gross hematuria. He denies any urinary complaints today other than getting up once nightly to void, and occasional difficulty starting and stopping his stream. He does also have a rare dribbling of urine if he can get to the bathroom on time but no frank incontinence.     PMH: Past Medical History    Diagnosis Date  . Diabetes mellitus without complication ([place])   . Cancer ([place])   . DVT (deep venous thrombosis) ([place])   . Hypertension   . COPD (chronic obstructive pulmonary disease) ([place])   . Diabetes mellitus without complication [facility])     Patient states he takes Glimeperide  . 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 Multiple myeloma [facility])     Surgical History: Past Surgical History  Procedure Laterality Date  . Peripheral vascular catheterization N/A 08/22/2014    Procedure: Glori Luis Cath Insertion;  Surgeon: Katha Cabal, MD;  Location: [facility];  Service: Cardiovascular;  Laterality: N/A;  . Limbal stem cell transplant      Multiple Myleoma  . Tonsillectomy    . 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 Cataract Right   . Ileostomy closure  2012    Home Medications:    Medication List       This list is accurate as of: 03/06/15 11:59 PM.  Always use your most recent med list.               albuterol 108 (90 Base) MCG/ACT inhaler  Commonly known as:  PROVENTIL HFA;VENTOLIN HFA  Inhale 2 puffs into the lungs every 6 (six) hours as needed for wheezing or shortness of breath.     azelastine 0.1 % nasal spray  Commonly known as:  ASTELIN  Place 2 sprays into both nostrils 2 (two) times daily. Use in each nostril  as directed     baclofen 10 MG tablet  Commonly known as:  LIORESAL  TAKE 1 TABLET THREE TIMES DAILY AS NEEDED FOR MUSCLE SPASM(S)     dexamethasone 4 MG tablet  Commonly known as:  DECADRON  TAKE 5 TABLETS ONE TIME WEEKLY ON MONDAY     diazepam 2 MG tablet  Commonly known as:  VALIUM  Take 1 tablet (2 mg total) by mouth at bedtime as needed for anxiety.     fluticasone 50 MCG/ACT nasal spray  Commonly known as:  FLONASE  Place 2 sprays into both nostrils daily as needed for rhinitis. Reported on 02/16/2015     gabapentin 300 MG capsule  Commonly known as:  NEURONTIN  Take 300 mg by mouth 2 (two) times daily. Reported on 03/06/2015      glimepiride 2 MG tablet  Commonly known as:  AMARYL  TAKE 1 TABLET TWICE DAILY     ibuprofen 800 MG tablet  Commonly known as:  ADVIL,MOTRIN  TAKE 1 TABLET THREE TIMES DAILY     ipratropium-albuterol 0.5-2.5 (3) MG/3ML Soln  Commonly known as:  DUONEB  Take 3 mLs by nebulization every 6 (six) hours as needed.     levalbuterol 45 MCG/ACT inhaler  Commonly known as:  XOPENEX HFA  Inhale into the lungs every 4 (four) hours as needed for wheezing. Reported on 03/06/2015     lidocaine-prilocaine cream  Commonly known as:  EMLA  Apply 1 application topically as needed. Apply to port then cover with saran wrap 1-2 hours before appointment     Melatonin 5 MG Tabs  Take 1 tablet by mouth at bedtime as needed (for sleep).     morphine 15 MG 12 hr tablet  Commonly known as:  MS CONTIN  Take 1 tablet (15 mg total) by mouth every 8 (eight) hours.     omeprazole 20 MG capsule  Commonly known as:  PRILOSEC  Take 20 mg by mouth daily as needed (for indigestion/heartburn).     oxyCODONE 5 MG immediate release tablet  Commonly known as:  Oxy IR/ROXICODONE  Take 1 tablet (5 mg total) by mouth every 4 (four) hours as needed for severe pain.     prednisoLONE acetate 1 % ophthalmic suspension  Commonly known as:  PRED FORTE  Place 1 drop into the right eye 2 (two) times daily.     predniSONE 20 MG tablet  Commonly known as:  DELTASONE  Take 2 tablets (40 mg total) by mouth daily.     prochlorperazine 10 MG tablet  Commonly known as:  COMPAZINE  Take 10 mg by mouth every 6 (six) hours as needed for nausea or vomiting. Reported on 03/06/2015     valACYclovir 500 MG tablet  Commonly known as:  VALTREX  Take 500 mg by mouth daily.        Allergies: No Known Allergies  Family History: Family History  Problem Relation Age of Onset  . Diabetes Mother   . Cancer Mother   . Kidney disease Mother   . Prostate cancer Neg Hx     Social History:  reports that he has been smoking  Cigarettes.  He has a 25 pack-year smoking history. He has never used smokeless tobacco. He reports that he does not drink alcohol or use illicit drugs.  ROS: UROLOGY Frequent Urination?: No Hard to postpone urination?: Yes Burning/pain with urination?: No Get up at night to urinate?: Yes Leakage of urine?: Yes Urine stream starts and  stops?: Yes Trouble starting stream?: Yes Do you have to strain to urinate?: Yes Blood in urine?: No Urinary tract infection?: No Sexually transmitted disease?: No Injury to kidneys or bladder?: No Painful intercourse?: No Weak stream?: Yes Erection problems?: No Penile pain?: No  Gastrointestinal Nausea?: No Vomiting?: No Indigestion/heartburn?: No Diarrhea?: No Constipation?: Yes  Constitutional Fever: No Night sweats?: No Weight loss?: Yes Fatigue?: Yes  Skin Skin rash/lesions?: No Itching?: No  Eyes Blurred vision?: Yes Double vision?: Yes  Ears/Nose/Throat Sore throat?: No Sinus problems?: No  Hematologic/Lymphatic Swollen glands?: No Easy bruising?: Yes  Cardiovascular Leg swelling?: No Chest pain?: No  Respiratory Cough?: Yes Shortness of breath?: Yes  Endocrine Excessive thirst?: Yes  Musculoskeletal Back pain?: Yes Joint pain?: No  Neurological Headaches?: No Dizziness?: Yes  Psychologic Depression?: No Anxiety?: No  Physical Exam: BP 122/71 mmHg  Pulse 101  Ht 5' 9"  (1.753 m)  Wt 158 lb 12.8 oz (72.031 kg)  BMI 23.44 kg/m2  Constitutional: Well nourished. Alert and oriented, No acute distress. HEENT: [place] AT, moist mucus membranes. Trachea midline, no masses. Cardiovascular: No clubbing, cyanosis, or edema. Respiratory: Labored respiratory effort,  increased work of breathing. Skin: No rashes, bruises or suspicious lesions. Lymph: No cervical or inguinal adenopathy. Neurologic: Grossly intact, no focal deficits, moving all 4 extremities. Psychiatric: Normal mood and affect.  Laboratory  Data: Lab Results  Component Value Date   WBC 2.9* 02/25/2015   HGB 9.9* 02/25/2015   HCT 29.6* 02/25/2015   MCV 91.9 02/25/2015   PLT 159 02/25/2015    Lab Results  Component Value Date   CREATININE 1.08 02/25/2015    Lab Results  Component Value Date   PSA 1.5 11/11/2012   Lab Results  Component Value Date   HGBA1C 6.0 12/27/2013    Lab Results  Component Value Date   AST 26 02/25/2015   Lab Results  Component Value Date   ALT 17 02/25/2015     Pertinent Imaging: CLINICAL DATA: Three-month history of pain across diaphragm with painful breathing. Mass on upper or right mid sided chest for 3 weeks. History of multiple myeloma.  EXAM: CT ANGIOGRAPHY CHEST  CT ABDOMEN WITH CONTRAST  TECHNIQUE: Multidetector CT imaging of the chest was performed using the standard protocol during bolus administration of intravenous contrast. Multiplanar CT image reconstructions and MIPs were obtained to evaluate the vascular anatomy. Multidetector CT imaging of the abdomen was performed using the standard protocol during bolus administration of intravenous contrast.  CONTRAST: 162m OMNIPAQUE IOHEXOL 350 MG/ML SOLN  COMPARISON: Chest CT from 08/02/2014. Abdomen and pelvis CT from 08/16/2014.  FINDINGS: CTA CHEST FINDINGS  Mediastinum / Lymph Nodes: No evidence for filling defect in the opacified pulmonary arteries to suggest the presence of an acute pulmonary embolus no evidence for dissection of the thoracic aorta.  No axillary lymphadenopathy. 3.2 cm short axis prevascular lymph node seen on image 21 series 504. 1.9 cm short axis prevascular lymph node is seen on image 22. No paratracheal or subcarinal lymphadenopathy. No evidence for hilar lymphadenopathy. There is no axillary lymphadenopathy.  Tip of the left Port-A-Cath is non central and appears to be positioned at the confluence of the left internal jugular and subclavian veins. The heart size is  normal. No pericardial effusion. The esophagus has normal imaging features.  8 x 18 mm left paraspinal nodule is seen on image 29 of series 504.  Lungs / Pleura: Lung windows show moderate changes of emphysema bilaterally. No focal airspace consolidation. Small  area of tree-in-bud opacity in the posterior right upper lobe is visible on image 36 series 505. No evidence of pulmonary edema. Pleural-parenchymal scarring is noted in both lung apices. No pleural effusion. There is some minimal subsegmental atelectasis in the posterior left lung base.  MSK / Soft Tissues: Compression fractures at T7 and T8 are new in the interval. Superior endplate compression fracture at T12 appears relatively stable. There are numerous tiny lucencies within the vertebral bodies of the thoracic spine. Lytic lesions are visible in the sternum and both scapula. Lytic and in some places expansile lesions are seen in the ribs bilaterally.  Review of the MIP images confirms the above findings.  CT ABDOMEN and PELVIS FINDINGS  Hepatobiliary: Scattered tiny hypodensities in the liver parenchyma are likely cysts. There is mild periportal edema. 2.3 cm gallstone evident with mild diffuse gallbladder wall thickening. No intrahepatic or extrahepatic biliary dilation.  Pancreas: Pancreas is diffusely atrophic.  Spleen: No splenomegaly. No focal mass lesion.  Adrenals/Urinary Tract: No adrenal nodule or mass. 3.5 cm exophytic lesion in the upper pole the left kidney has progressed slightly in size in the interval from 2.5 cm in the same dimension previously. Lesion appears to show low level diffuse enhancement after IV contrast administration.  Tiny nonobstructing stones are seen in the kidneys bilaterally. Tiny cortical cysts are seen in the kidneys bilaterally. No evidence for hydronephrosis.  Stomach/Bowel: Stomach is nondistended. No gastric wall thickening. No evidence of outlet obstruction.  Duodenum is normally positioned as is the ligament of Treitz. Visualized small bowel and colon is nondilated.  Vascular/Lymphatic: There is abdominal aortic atherosclerosis without aneurysm.  Bulky right retrocrural lymphadenopathy is new in the interval. Index right retrocrural lymph node measures 2.1 cm in diameter on image 9 series 511. 2.6 cm short axis lymph node is seen in the retro scrotal space, at about the level of the hiatus. In the upper abdomen, there is bulky retroperitoneal lymphadenopathy. 4.7 x 4.7 cm nodal conglomeration is seen in the aortocaval space on image 41 of series 511. There is a large lymph node in the hepatoduodenal ligament and measures 5.7 x 5.5 cm in shows central necrosis. This lesion generates substantial mass portal vein does remain patent. SMV and splenic vein are patent. Fat planes around the SMA are preserved.  4.7 x 5.7 cm nodal conglomeration circumferentially encases the inferior IVC (see image 60 series 511). Abnormal lymphadenopathy extends along the right common iliac vessels although this is incompletely visualized on this abdominal CT.  Other: No evidence for intraperitoneal free fluid.  Musculoskeletal: Multiple lytic and sclerotic lesions are seen in the lumbar spine with near complete collapse of the L3 vertebral body.  IMPRESSION: 1. Left-sided Port-A-Cath tip is is not central, with tip projecting at the confluence of the left IJ and subclavian veins. 2. Prevascular lymphadenopathy in the superior mediastinum with a left paraspinal lesion identified more inferiorly in the thorax. 3. Bulky retrocrural, hepatoduodenal ligament, mesenteric and retroperitoneal lymphadenopathy in the abdomen. One of the largest lymph nodes is necrotic and positioned in the hepatoduodenal ligament, generating substantial mass effect on the portal vein although the portal vein does remain patent. Retroperitoneal lymphadenopathy  circumferentially encases the inferior IVC and tracks down along the right iliac vessels although this area was not completely assessed as no CT pelvis was performed as part of this exam. 4. Multiple mainly lytic, but some lytic and sclerotic lesions are seen in the axial and appendicular skeleton. These could be related to the  patient's history of multiple myeloma and/or metastatic disease. 5. New compression fractures at T7 and T8. These may be pathologic. T12 compression deformity was present previously and is relatively stable. 6. Slight interval increase in size of the complex lesion in the upper pole the left kidney. This may be a cyst complicated by proteinaceous debris or hemorrhage, but features suggest that there may be some low level enhancement within the lesion. MRI without and with contrast would be the study of choice to further evaluate as neoplasm cannot be excluded. 7. Abdominal aortic atherosclerosis 8. Emphysema. 9. Tree-in-bud opacity in the posterior right upper lobe suggests atypical infection.   Electronically Signed  By: Misty Stanley M.D.  On: 02/28/2015 17:10        Assessment & Plan:   1. Renal mass:   I reiterated the discussion the patient had Dr. Erlene Quan in July 2016 concerning the mass having an approximately 80% chance of representing a malignancy, but in general, lesions under 3-4 cm have a low risk of metastases and renal masses tend to be slow growing. There are the options of biopsy, cryoablation and partial nephrectomy, but with the other findings on this recent CT scan we will defer any treatment or intervention at this time.  He has an upcoming appointment with Dr. Grayland Ormond to discuss the other findings on this recent CT scan.  Dr. Erlene Quan would like to discuss this further with Dr. Grayland Ormond.    2. Multiple myeloma:   Patient's prognosis is uncertain at this time.   3. Family history of kidney cancer:   Questionable family history of  metastatic RCC and mother.    Return for Dr. Erlene Quan to speak with Dr. Grayland Ormond.  These notes generated with voice recognition software. I apologize for typographical errors.  Royden Purl  [facility] [address] [place], [place] [address] [phone]  Addendum: Patient's case was discussed at tumor board this Thursday, 03/08/2015, Dr. Grayland Ormond feels that he may have a third primary cancer and is arranging a biopsy of a lymph node.

## 2015-03-08 ENCOUNTER — Inpatient Hospital Stay: Payer: Commercial Managed Care - HMO

## 2015-03-08 ENCOUNTER — Telehealth: Payer: Self-pay | Admitting: *Deleted

## 2015-03-08 ENCOUNTER — Inpatient Hospital Stay (HOSPITAL_BASED_OUTPATIENT_CLINIC_OR_DEPARTMENT_OTHER): Payer: Commercial Managed Care - HMO | Admitting: Oncology

## 2015-03-08 ENCOUNTER — Other Ambulatory Visit: Payer: Self-pay | Admitting: *Deleted

## 2015-03-08 VITALS — BP 101/68 | HR 111 | Temp 96.7°F | Resp 18 | Wt 157.8 lb

## 2015-03-08 DIAGNOSIS — F17210 Nicotine dependence, cigarettes, uncomplicated: Secondary | ICD-10-CM

## 2015-03-08 DIAGNOSIS — C9002 Multiple myeloma in relapse: Secondary | ICD-10-CM

## 2015-03-08 DIAGNOSIS — E876 Hypokalemia: Secondary | ICD-10-CM | POA: Diagnosis not present

## 2015-03-08 DIAGNOSIS — C9000 Multiple myeloma not having achieved remission: Secondary | ICD-10-CM

## 2015-03-08 DIAGNOSIS — M25511 Pain in right shoulder: Secondary | ICD-10-CM

## 2015-03-08 DIAGNOSIS — E119 Type 2 diabetes mellitus without complications: Secondary | ICD-10-CM

## 2015-03-08 DIAGNOSIS — Z86718 Personal history of other venous thrombosis and embolism: Secondary | ICD-10-CM

## 2015-03-08 DIAGNOSIS — D72819 Decreased white blood cell count, unspecified: Secondary | ICD-10-CM

## 2015-03-08 DIAGNOSIS — R590 Localized enlarged lymph nodes: Secondary | ICD-10-CM

## 2015-03-08 DIAGNOSIS — Z79899 Other long term (current) drug therapy: Secondary | ICD-10-CM

## 2015-03-08 LAB — BASIC METABOLIC PANEL
ANION GAP: 6 (ref 5–15)
BUN: 25 mg/dL — ABNORMAL HIGH (ref 6–20)
CALCIUM: 12.7 mg/dL — AB (ref 8.9–10.3)
CO2: 26 mmol/L (ref 22–32)
Chloride: 102 mmol/L (ref 101–111)
Creatinine, Ser: 1.34 mg/dL — ABNORMAL HIGH (ref 0.61–1.24)
GFR, EST NON AFRICAN AMERICAN: 52 mL/min — AB (ref >60)
GLUCOSE: 147 mg/dL — AB (ref 65–99)
POTASSIUM: 3.3 mmol/L — AB (ref 3.5–5.1)
Sodium: 134 mmol/L — ABNORMAL LOW (ref 135–145)

## 2015-03-08 LAB — CBC WITH DIFFERENTIAL/PLATELET
BASOS ABS: 0 10*3/uL (ref 0–0.1)
BASOS PCT: 0 %
EOS PCT: 1 %
Eosinophils Absolute: 0.1 10*3/uL (ref 0–0.7)
HCT: 33.1 % — ABNORMAL LOW (ref 40.0–52.0)
Hemoglobin: 11.3 g/dL — ABNORMAL LOW (ref 13.0–18.0)
LYMPHS PCT: 10 %
Lymphs Abs: 0.6 10*3/uL — ABNORMAL LOW (ref 1.0–3.6)
MCH: 31.5 pg (ref 26.0–34.0)
MCHC: 34 g/dL (ref 32.0–36.0)
MCV: 92.5 fL (ref 80.0–100.0)
Monocytes Absolute: 0.9 10*3/uL (ref 0.2–1.0)
Monocytes Relative: 16 %
NEUTROS ABS: 4.2 10*3/uL (ref 1.4–6.5)
Neutrophils Relative %: 73 %
PLATELETS: 168 10*3/uL (ref 150–440)
RBC: 3.58 MIL/uL — AB (ref 4.40–5.90)
RDW: 14.4 % (ref 11.5–14.5)
WBC: 5.7 10*3/uL (ref 3.8–10.6)

## 2015-03-08 LAB — HEMOGLOBIN A1C: HEMOGLOBIN A1C: 6.1 % — AB (ref 4.0–6.0)

## 2015-03-08 MED ORDER — SODIUM CHLORIDE 0.9 % IJ SOLN
10.0000 mL | INTRAMUSCULAR | Status: DC | PRN
  Filled 2015-03-08: qty 10

## 2015-03-08 MED ORDER — ZOLEDRONIC ACID 4 MG/100ML IV SOLN
4.0000 mg | Freq: Once | INTRAVENOUS | Status: AC
  Administered 2015-03-08: 4 mg via INTRAVENOUS
  Filled 2015-03-08: qty 100

## 2015-03-08 MED ORDER — SODIUM CHLORIDE 0.9% FLUSH
10.0000 mL | Freq: Once | INTRAVENOUS | Status: AC
  Administered 2015-03-08: 10 mL via INTRAVENOUS
  Filled 2015-03-08: qty 10

## 2015-03-08 MED ORDER — HEPARIN SOD (PORK) LOCK FLUSH 100 UNIT/ML IV SOLN
500.0000 [IU] | Freq: Once | INTRAVENOUS | Status: AC
  Administered 2015-03-08: 500 [IU] via INTRAVENOUS

## 2015-03-08 MED ORDER — MEGESTROL ACETATE 625 MG/5ML PO SUSP: 625.0000 mg | Freq: Every day | ORAL | Status: AC

## 2015-03-08 MED ORDER — HEPARIN SOD (PORK) LOCK FLUSH 100 UNIT/ML IV SOLN
500.0000 [IU] | Freq: Once | INTRAVENOUS | Status: DC | PRN
  Filled 2015-03-08: qty 5

## 2015-03-08 MED ORDER — SODIUM CHLORIDE 0.9 % IV SOLN
Freq: Once | INTRAVENOUS | Status: AC
  Administered 2015-03-08: 12:00:00 via INTRAVENOUS
  Filled 2015-03-08: qty 1000

## 2015-03-08 MED ORDER — PROCHLORPERAZINE MALEATE 10 MG PO TABS: 10.0000 mg | ORAL_TABLET | Freq: Four times a day (QID) | ORAL | Status: AC | PRN

## 2015-03-08 NOTE — Telephone Encounter (Addendum)
Asking for med for appetite be sent to pharmacy, they got nausea med, but not appetite med

## 2015-03-08 NOTE — Progress Notes (Signed)
Patient is sleeping a lot and not eating very much.  Wife is inte,ested in discussing something to help  Increase appetite.

## 2015-03-09 ENCOUNTER — Other Ambulatory Visit: Payer: Self-pay | Admitting: Radiology

## 2015-03-09 LAB — PROTEIN ELECTROPHORESIS, SERUM
A/G RATIO SPE: 1 (ref 0.7–1.7)
ALBUMIN ELP: 3.4 g/dL (ref 2.9–4.4)
ALPHA-1-GLOBULIN: 0.3 g/dL (ref 0.0–0.4)
ALPHA-2-GLOBULIN: 0.7 g/dL (ref 0.4–1.0)
BETA GLOBULIN: 0.7 g/dL (ref 0.7–1.3)
Gamma Globulin: 1.8 g/dL (ref 0.4–1.8)
Globulin, Total: 3.5 g/dL (ref 2.2–3.9)
M-Spike, %: 1.6 g/dL — ABNORMAL HIGH
Total Protein ELP: 6.9 g/dL (ref 6.0–8.5)

## 2015-03-10 ENCOUNTER — Inpatient Hospital Stay
Admission: EM | Admit: 2015-03-10 | Discharge: 2015-03-14 | DRG: 641 | Disposition: A | Discharge status: Home / Self Care | Payer: Commercial Managed Care - HMO | Attending: Specialist | Admitting: Specialist

## 2015-03-10 ENCOUNTER — Other Ambulatory Visit: Payer: Self-pay

## 2015-03-10 ENCOUNTER — Emergency Department: Payer: Commercial Managed Care - HMO

## 2015-03-10 ENCOUNTER — Encounter: Payer: Self-pay | Admitting: *Deleted

## 2015-03-10 DIAGNOSIS — E876 Hypokalemia: Secondary | ICD-10-CM | POA: Diagnosis present

## 2015-03-10 DIAGNOSIS — Z66 Do not resuscitate: Secondary | ICD-10-CM | POA: Diagnosis present

## 2015-03-10 DIAGNOSIS — J449 Chronic obstructive pulmonary disease, unspecified: Secondary | ICD-10-CM | POA: Diagnosis present

## 2015-03-10 DIAGNOSIS — N289 Disorder of kidney and ureter, unspecified: Secondary | ICD-10-CM | POA: Diagnosis present

## 2015-03-10 DIAGNOSIS — G8929 Other chronic pain: Secondary | ICD-10-CM | POA: Diagnosis present

## 2015-03-10 DIAGNOSIS — E860 Dehydration: Secondary | ICD-10-CM

## 2015-03-10 DIAGNOSIS — I10 Essential (primary) hypertension: Secondary | ICD-10-CM | POA: Diagnosis present

## 2015-03-10 DIAGNOSIS — F17210 Nicotine dependence, cigarettes, uncomplicated: Secondary | ICD-10-CM | POA: Diagnosis present

## 2015-03-10 DIAGNOSIS — C8599 Non-Hodgkin lymphoma, unspecified, extranodal and solid organ sites: Secondary | ICD-10-CM

## 2015-03-10 DIAGNOSIS — Z86718 Personal history of other venous thrombosis and embolism: Secondary | ICD-10-CM

## 2015-03-10 DIAGNOSIS — C9000 Multiple myeloma not having achieved remission: Secondary | ICD-10-CM | POA: Diagnosis present

## 2015-03-10 DIAGNOSIS — Z841 Family history of disorders of kidney and ureter: Secondary | ICD-10-CM

## 2015-03-10 DIAGNOSIS — R531 Weakness: Secondary | ICD-10-CM

## 2015-03-10 DIAGNOSIS — K219 Gastro-esophageal reflux disease without esophagitis: Secondary | ICD-10-CM | POA: Diagnosis present

## 2015-03-10 DIAGNOSIS — Z809 Family history of malignant neoplasm, unspecified: Secondary | ICD-10-CM

## 2015-03-10 DIAGNOSIS — R251 Tremor, unspecified: Secondary | ICD-10-CM | POA: Diagnosis present

## 2015-03-10 DIAGNOSIS — Z79899 Other long term (current) drug therapy: Secondary | ICD-10-CM

## 2015-03-10 DIAGNOSIS — Z9841 Cataract extraction status, right eye: Secondary | ICD-10-CM

## 2015-03-10 DIAGNOSIS — Z9889 Other specified postprocedural states: Secondary | ICD-10-CM

## 2015-03-10 DIAGNOSIS — C9030 Solitary plasmacytoma not having achieved remission: Secondary | ICD-10-CM | POA: Diagnosis present

## 2015-03-10 DIAGNOSIS — R29898 Other symptoms and signs involving the musculoskeletal system: Secondary | ICD-10-CM

## 2015-03-10 DIAGNOSIS — E1140 Type 2 diabetes mellitus with diabetic neuropathy, unspecified: Secondary | ICD-10-CM | POA: Diagnosis present

## 2015-03-10 DIAGNOSIS — Z833 Family history of diabetes mellitus: Secondary | ICD-10-CM

## 2015-03-10 LAB — CBC
HEMATOCRIT: 29.7 % — AB (ref 40.0–52.0)
Hemoglobin: 10 g/dL — ABNORMAL LOW (ref 13.0–18.0)
MCH: 31.4 pg (ref 26.0–34.0)
MCHC: 33.8 g/dL (ref 32.0–36.0)
MCV: 92.9 fL (ref 80.0–100.0)
Platelets: 136 10*3/uL — ABNORMAL LOW (ref 150–440)
RBC: 3.2 MIL/uL — ABNORMAL LOW (ref 4.40–5.90)
RDW: 14.7 % — AB (ref 11.5–14.5)
WBC: 4.9 10*3/uL (ref 3.8–10.6)

## 2015-03-10 LAB — URINALYSIS COMPLETE WITH MICROSCOPIC (ARMC ONLY)
BILIRUBIN URINE: NEGATIVE
Bacteria, UA: NONE SEEN
GLUCOSE, UA: NEGATIVE mg/dL
KETONES UR: NEGATIVE mg/dL
LEUKOCYTES UA: NEGATIVE
NITRITE: NEGATIVE
Protein, ur: 100 mg/dL — AB
Specific Gravity, Urine: 1.017 (ref 1.005–1.030)
pH: 5 (ref 5.0–8.0)

## 2015-03-10 LAB — COMPREHENSIVE METABOLIC PANEL
ALK PHOS: 89 U/L (ref 38–126)
ALT: 19 U/L (ref 17–63)
AST: 31 U/L (ref 15–41)
Albumin: 3.3 g/dL — ABNORMAL LOW (ref 3.5–5.0)
Anion gap: 6 (ref 5–15)
BILIRUBIN TOTAL: 0.3 mg/dL (ref 0.3–1.2)
BUN: 27 mg/dL — ABNORMAL HIGH (ref 6–20)
CALCIUM: 11.5 mg/dL — AB (ref 8.9–10.3)
CO2: 30 mmol/L (ref 22–32)
Chloride: 103 mmol/L (ref 101–111)
Creatinine, Ser: 1.47 mg/dL — ABNORMAL HIGH (ref 0.61–1.24)
GFR calc Af Amer: 54 mL/min — ABNORMAL LOW (ref >60)
GFR, EST NON AFRICAN AMERICAN: 47 mL/min — AB (ref >60)
Glucose, Bld: 66 mg/dL (ref 65–99)
POTASSIUM: 2.8 mmol/L — AB (ref 3.5–5.1)
Sodium: 139 mmol/L (ref 135–145)
TOTAL PROTEIN: 7.3 g/dL (ref 6.5–8.1)

## 2015-03-10 LAB — MAGNESIUM: MAGNESIUM: 1.7 mg/dL (ref 1.7–2.4)

## 2015-03-10 LAB — GLUCOSE, CAPILLARY
GLUCOSE-CAPILLARY: 30 mg/dL — AB (ref 65–99)
GLUCOSE-CAPILLARY: 31 mg/dL — AB (ref 65–99)
GLUCOSE-CAPILLARY: 35 mg/dL — AB (ref 65–99)
Glucose-Capillary: 29 mg/dL — CL (ref 65–99)
Glucose-Capillary: 31 mg/dL — CL (ref 65–99)
Glucose-Capillary: 33 mg/dL — CL (ref 65–99)

## 2015-03-10 LAB — TSH: TSH: 0.692 u[IU]/mL (ref 0.350–4.500)

## 2015-03-10 LAB — TROPONIN I
Troponin I: 0.04 ng/mL — ABNORMAL HIGH (ref <0.031)
Troponin I: <0.03 ng/mL (ref <0.031)

## 2015-03-10 MED ORDER — POTASSIUM CHLORIDE CRYS ER 20 MEQ PO TBCR
40.0000 meq | EXTENDED_RELEASE_TABLET | Freq: Once | ORAL | Status: AC
  Administered 2015-03-10: 40 meq via ORAL
  Filled 2015-03-10: qty 2

## 2015-03-10 MED ORDER — SODIUM CHLORIDE 0.9 % IV SOLN
1000.0000 mL | Freq: Once | INTRAVENOUS | Status: AC
  Administered 2015-03-10: 1000 mL via INTRAVENOUS

## 2015-03-10 MED ORDER — AZELASTINE HCL 0.1 % NA SOLN
2.0000 | Freq: Two times a day (BID) | NASAL | Status: DC
Start: 2015-03-10 — End: 2015-03-14
  Administered 2015-03-14: 2 via NASAL
  Filled 2015-03-10: qty 30

## 2015-03-10 MED ORDER — BACLOFEN 10 MG PO TABS
10.0000 mg | ORAL_TABLET | Freq: Three times a day (TID) | ORAL | Status: DC
  Administered 2015-03-10 – 2015-03-14 (×10): 10 mg via ORAL
  Filled 2015-03-10 (×10): qty 1

## 2015-03-10 MED ORDER — ONDANSETRON HCL 4 MG PO TABS: 4.0000 mg | ORAL_TABLET | Freq: Four times a day (QID) | ORAL | Status: DC | PRN

## 2015-03-10 MED ORDER — MEGESTROL ACETATE 625 MG/5ML PO SUSP: 625.0000 mg | Freq: Every day | ORAL | Status: DC

## 2015-03-10 MED ORDER — DIAZEPAM 2 MG PO TABS
2.0000 mg | ORAL_TABLET | Freq: Every day | ORAL | Status: DC
  Administered 2015-03-10 – 2015-03-13 (×4): 2 mg via ORAL
  Filled 2015-03-10 (×4): qty 1

## 2015-03-10 MED ORDER — MELATONIN 5 MG PO TABS: 5.0000 mg | ORAL_TABLET | Freq: Every day | ORAL | Status: DC

## 2015-03-10 MED ORDER — PANTOPRAZOLE SODIUM 40 MG PO TBEC
40.0000 mg | DELAYED_RELEASE_TABLET | Freq: Every day | ORAL | Status: DC
  Administered 2015-03-12 – 2015-03-14 (×2): 40 mg via ORAL
  Filled 2015-03-10 (×4): qty 1

## 2015-03-10 MED ORDER — INSULIN ASPART 100 UNIT/ML ~~LOC~~ SOLN: 0.0000 [IU] | Freq: Every day | SUBCUTANEOUS | Status: DC

## 2015-03-10 MED ORDER — SODIUM CHLORIDE 0.9 % IV SOLN
INTRAVENOUS | Status: DC
  Administered 2015-03-10: 22:00:00 via INTRAVENOUS

## 2015-03-10 MED ORDER — MORPHINE SULFATE (PF) 2 MG/ML IV SOLN
2.0000 mg | INTRAVENOUS | Status: DC | PRN
  Administered 2015-03-10: 2 mg via INTRAVENOUS
  Filled 2015-03-10: qty 1

## 2015-03-10 MED ORDER — VALACYCLOVIR HCL 500 MG PO TABS
500.0000 mg | ORAL_TABLET | Freq: Every day | ORAL | Status: DC
  Administered 2015-03-11 – 2015-03-14 (×4): 500 mg via ORAL
  Filled 2015-03-10 (×4): qty 1

## 2015-03-10 MED ORDER — MEGESTROL ACETATE 400 MG/10ML PO SUSP
625.0000 mg | Freq: Every day | ORAL | Status: DC
  Administered 2015-03-12 – 2015-03-13 (×2): 625 mg via ORAL
  Filled 2015-03-10 (×2): qty 20
  Filled 2015-03-10: qty 10
  Filled 2015-03-10: qty 20

## 2015-03-10 MED ORDER — ONDANSETRON HCL 4 MG/2ML IJ SOLN: 4.0000 mg | Freq: Four times a day (QID) | INTRAMUSCULAR | Status: DC | PRN

## 2015-03-10 MED ORDER — HEPARIN SODIUM (PORCINE) 5000 UNIT/ML IJ SOLN
5000.0000 [IU] | Freq: Three times a day (TID) | INTRAMUSCULAR | Status: DC
  Administered 2015-03-10 – 2015-03-14 (×7): 5000 [IU] via SUBCUTANEOUS
  Filled 2015-03-10 (×8): qty 1

## 2015-03-10 MED ORDER — GABAPENTIN 300 MG PO CAPS
300.0000 mg | ORAL_CAPSULE | Freq: Two times a day (BID) | ORAL | Status: DC
  Administered 2015-03-10 – 2015-03-14 (×8): 300 mg via ORAL
  Filled 2015-03-10 (×8): qty 1

## 2015-03-10 MED ORDER — INSULIN ASPART 100 UNIT/ML ~~LOC~~ SOLN
0.0000 [IU] | Freq: Three times a day (TID) | SUBCUTANEOUS | Status: DC
  Administered 2015-03-11 – 2015-03-12 (×4): 1 [IU] via SUBCUTANEOUS
  Administered 2015-03-13 (×2): 2 [IU] via SUBCUTANEOUS
  Filled 2015-03-10: qty 2
  Filled 2015-03-10 (×3): qty 1
  Filled 2015-03-10: qty 2
  Filled 2015-03-10: qty 1

## 2015-03-10 MED ORDER — IPRATROPIUM-ALBUTEROL 0.5-2.5 (3) MG/3ML IN SOLN
3.0000 mL | Freq: Once | RESPIRATORY_TRACT | Status: AC
  Administered 2015-03-10: 3 mL via RESPIRATORY_TRACT

## 2015-03-10 MED ORDER — ACETAMINOPHEN 650 MG RE SUPP: 650.0000 mg | Freq: Four times a day (QID) | RECTAL | Status: DC | PRN

## 2015-03-10 MED ORDER — OXYCODONE HCL 5 MG PO TABS: 5.0000 mg | ORAL_TABLET | ORAL | Status: DC | PRN

## 2015-03-10 MED ORDER — IPRATROPIUM-ALBUTEROL 0.5-2.5 (3) MG/3ML IN SOLN
RESPIRATORY_TRACT | Status: AC
  Filled 2015-03-10: qty 3

## 2015-03-10 MED ORDER — ACETAMINOPHEN 325 MG PO TABS: 650.0000 mg | ORAL_TABLET | Freq: Four times a day (QID) | ORAL | Status: DC | PRN

## 2015-03-10 MED ORDER — MORPHINE SULFATE ER 15 MG PO TBCR
15.0000 mg | EXTENDED_RELEASE_TABLET | Freq: Three times a day (TID) | ORAL | Status: DC
  Administered 2015-03-10 – 2015-03-14 (×9): 15 mg via ORAL
  Filled 2015-03-10 (×9): qty 1

## 2015-03-10 MED ORDER — PREDNISOLONE ACETATE 1 % OP SUSP
1.0000 [drp] | Freq: Two times a day (BID) | OPHTHALMIC | Status: DC
  Administered 2015-03-11 – 2015-03-14 (×7): 1 [drp] via OPHTHALMIC
  Filled 2015-03-10: qty 1

## 2015-03-10 NOTE — ED Notes (Signed)
Pt O2 decreased to 85%, Pt placed on 2L. MD notified.

## 2015-03-10 NOTE — Progress Notes (Signed)
PHARMACIST - PHYSICIAN ORDER COMMUNICATION  CONCERNING: P&T Medication Policy on Herbal Medications  DESCRIPTION:  This patient's order for:  melatonin  has been noted.  This product(s) is classified as an "herbal" or natural product. Due to a lack of definitive safety studies or FDA approval, nonstandard manufacturing practices, plus the potential risk of unknown drug-drug interactions while on inpatient medications, the Pharmacy and Therapeutics Committee does not permit the use of "herbal" or natural products of this type within [place].   ACTION TAKEN: The pharmacy department is unable to verify this order at this time. Please reevaluate patient's clinical condition at discharge and address if the herbal or natural product(s) should be resumed at that time.   

## 2015-03-10 NOTE — ED Provider Notes (Signed)
[facility] Emergency Department Provider Note  ____________________________________________    I have reviewed the triage vital signs and the nursing notes.   HISTORY  Chief Complaint Weakness    HPI Russell Keller is a 70 y.o. male who presents with complaints of diffuse weakness for the last several days. Patient also reports he has a tremor which seems to be worse when he is sitting up and walking around makes it difficult for him to hold things in his hands. This does not seem to bea new thing for him but doesn't be worse over the last few days. Wife reports he is not eating or drinking either. He is followed by Dr. Grayland Ormond for multiple myeloma and is scheduled for lymph node biopsy next week. He denies fevers chills or cough.     Past Medical History  Diagnosis Date  . Diabetes mellitus without complication ([place])   . Cancer ([place])   . DVT (deep venous thrombosis) ([place])   . Hypertension   . COPD (chronic obstructive pulmonary disease) ([place])   . Diabetes mellitus without complication [facility])     Patient states he takes Glimeperide  . Plasmocytoma ([place]) 2004  . Multiple myeloma [facility])     Patient Active Problem List   Diagnosis Date Noted  . Ulnar tunnel syndrome of right wrist 02/26/2015  . Type 2 diabetes mellitus without complication, without long-term current use of insulin ([place]) 02/16/2015  . Chest wall mass 02/16/2015  . Renal mass, left 08/10/2014  . Hypokalemia 08/10/2014  . Acute respiratory failure ([place]) 08/03/2014  . PNA (pneumonia) 08/02/2014  . Benign prostatic hypertrophy without urinary obstruction 07/15/2014  . Current smoker 07/15/2014  . Arthralgia of hip 07/15/2014  . Acquired polyneuropathy ([place]) 07/15/2014  . Chronic obstructive pulmonary emphysema ([place]) 07/15/2014  . CA of skin 07/15/2014  . Muscle spasm 07/13/2014  . Community acquired pneumonia 06/17/2014  . Essential hypertension 06/17/2014  . DVT (deep  venous thrombosis) ([place])   . Plasmocytoma ([place])   . Multiple myeloma in relapse ([place]) 06/04/2014    Past Surgical History  Procedure Laterality Date  . Peripheral vascular catheterization N/A 08/22/2014    Procedure: Glori Luis Cath Insertion;  Surgeon: Katha Cabal, MD;  Location: [facility];  Service: Cardiovascular;  Laterality: N/A;  . Limbal stem cell transplant      Multiple Myleoma  . Tonsillectomy    . 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 Cataract Right   . Ileostomy closure  2012    Current Outpatient Rx  Name  Route  Sig  Dispense  Refill  . albuterol (PROVENTIL HFA;VENTOLIN HFA) 108 (90 BASE) MCG/ACT inhaler   Inhalation   Inhale 2 puffs into the lungs every 6 (six) hours as needed for wheezing or shortness of breath.   3 Inhaler   3   . azelastine (ASTELIN) 0.1 % nasal spray   Each Nare   Place 2 sprays into both nostrils 2 (two) times daily. Use in each nostril as directed   30 mL   0   . baclofen (LIORESAL) 10 MG tablet      TAKE 1 TABLET THREE TIMES DAILY AS NEEDED FOR MUSCLE SPASM(S)   270 tablet   1   . dexamethasone (DECADRON) 4 MG tablet      TAKE 5 TABLETS ONE TIME WEEKLY ON MONDAY    60 tablet   5   . diazepam (VALIUM) 2 MG tablet  Oral   Take 1 tablet (2 mg total) by mouth at bedtime as needed for anxiety.   30 tablet   1   . fluticasone (FLONASE) 50 MCG/ACT nasal spray   Each Nare   Place 2 sprays into both nostrils daily as needed for rhinitis. Reported on 02/16/2015         . gabapentin (NEURONTIN) 300 MG capsule   Oral   Take 300 mg by mouth 2 (two) times daily. Reported on 03/06/2015         . glimepiride (AMARYL) 2 MG tablet      TAKE 1 TABLET TWICE DAILY   180 tablet   3   . ibuprofen (ADVIL,MOTRIN) 800 MG tablet      TAKE 1 TABLET THREE TIMES DAILY   270 tablet   3   . ipratropium-albuterol (DUONEB) 0.5-2.5 (3) MG/3ML SOLN   Nebulization   Take 3 mLs by nebulization every 6 (six) hours  as needed.   360 mL   3   . levalbuterol (XOPENEX HFA) 45 MCG/ACT inhaler   Inhalation   Inhale into the lungs every 4 (four) hours as needed for wheezing. Reported on 03/06/2015         . lidocaine-prilocaine (EMLA) cream   Topical   Apply 1 application topically as needed. Apply to port then cover with saran wrap 1-2 hours before appointment   30 g   2   . megestrol (MEGACE ES) 625 MG/5ML suspension   Oral   Take 5 mLs (625 mg total) by mouth daily.   150 mL   2   . Melatonin 5 MG TABS   Oral   Take 1 tablet by mouth at bedtime as needed (for sleep).         . morphine (MS CONTIN) 15 MG 12 hr tablet   Oral   Take 1 tablet (15 mg total) by mouth every 8 (eight) hours.   90 tablet   0   . omeprazole (PRILOSEC) 20 MG capsule   Oral   Take 20 mg by mouth daily as needed (for indigestion/heartburn).          Marland Kitchen oxyCODONE (OXY IR/ROXICODONE) 5 MG immediate release tablet   Oral   Take 1 tablet (5 mg total) by mouth every 4 (four) hours as needed for severe pain.   60 tablet   0   . prednisoLONE acetate (PRED FORTE) 1 % ophthalmic suspension   Right Eye   Place 1 drop into the right eye 2 (two) times daily.         . predniSONE (DELTASONE) 20 MG tablet   Oral   Take 2 tablets (40 mg total) by mouth daily. Patient not taking: Reported on 03/06/2015   10 tablet   0   . prochlorperazine (COMPAZINE) 10 MG tablet   Oral   Take 1 tablet (10 mg total) by mouth every 6 (six) hours as needed for nausea or vomiting. Reported on 03/06/2015   45 tablet   1   . valACYclovir (VALTREX) 500 MG tablet   Oral   Take 500 mg by mouth daily.           Allergies Review of patient's allergies indicates no known allergies.  Family History  Problem Relation Age of Onset  . Diabetes Mother   . Cancer Mother   . Kidney disease Mother   . Prostate cancer Neg Hx     Social History Social History  Substance Use Topics  .  Smoking status: Current Every Day Smoker -- 0.50  packs/day for 50 years    Types: Cigarettes  . Smokeless tobacco: Never Used  . Alcohol Use: No    Review of Systems  Constitutional: Negative for fever. Eyes: Negative for visual changes. ENT: Decreased by mouth intake Cardiovascular: Negative for chest pain. Respiratory: Negative for shortness of breath. Negative for cough Gastrointestinal: Negative for abdominal pain, vomiting and diarrhea. Genitourinary: Negative for dysuria. Musculoskeletal: Negative for back pain. Skin: Negative for rash. Neurological: Negative for headaches or focal weakness Psychiatric: No anxiety    ____________________________________________   PHYSICAL EXAM:  VITAL SIGNS: ED Triage Vitals  Enc Vitals Group     BP 03/10/15 1514 106/58 mmHg     Pulse Rate 03/10/15 1514 103     Resp 03/10/15 1514 18     Temp 03/10/15 1514 98.5 F (36.9 C)     Temp Source 03/10/15 1514 Oral     SpO2 03/10/15 1514 94 %     Weight 03/10/15 1514 156 lb (70.761 kg)     Height 03/10/15 1514 _0  (1.778 m)     Head Cir --      Peak Flow --      Pain Score 03/10/15 1516 0     Pain Loc --      Pain Edu? --      Excl. in [place]? --      Constitutional: Alert and oriented. Well appearing and in no distress. Eyes: Conjunctivae are normal.  ENT   Head: Normocephalic and atraumatic.   Mouth/Throat: Mucous membranes are moist. Cardiovascular: Normal rate, regular rhythm. Normal and symmetric distal pulses are present in all extremities.  Respiratory: Normal respiratory effort without tachypnea nor retractions. Scattered wheezes Gastrointestinal: Soft and non-tender in all quadrants. No distention. There is no CVA tenderness. Genitourinary: deferred Musculoskeletal: Nontender with normal range of motion in all extremities. No lower extremity tenderness nor edema. Neurologic:  Normal speech and language. No gross focal neurologic deficits are appreciated. Skin:  Skin is warm, dry and intact. No rash  noted. Psychiatric: Mood and affect are normal. Patient exhibits appropriate insight and judgment.  ____________________________________________    LABS (pertinent positives/negatives)  Labs Reviewed  CBC  COMPREHENSIVE METABOLIC PANEL  TROPONIN I  URINALYSIS COMPLETEWITH MICROSCOPIC ([place])    ____________________________________________   EKG  ED ECG REPORT I, Lavonia Drafts, the attending physician, personally viewed and interpreted this ECG.   Date: 03/10/2015  EKG Time: 3:23 PM  Rate: 97  Rhythm: 1st degree AV block  Axis: Normal  Intervals:first-degree A-V block   ST&T Change: Nonspecific, unchanged from prior EKG   ____________________________________________    RADIOLOGY I have personally reviewed any xrays that were ordered on this patient: Chest x-ray unremarkable  ____________________________________________   PROCEDURES  Procedure(s) performed: none  Critical Care performed: none  ____________________________________________   INITIAL IMPRESSION / ASSESSMENT AND PLAN / ED COURSE  Pertinent labs & imaging results that were available during my care of the patient were reviewed by me and considered in my medical decision making (see chart for details).  Patient presents with diffuse weakness. Family is convinced that he is dehydrated which is certainly possible. We will also check for possible infection. We will give IV fluids via his port and reevaluate.  I discussed the case with Dr. Grayland Ormond his oncologist who is suspicious that his weakness is related to his cancer but agrees with IV fluids and possible discharge.   Patient has mild hypokalemia and  an elevation of his BUN and creatinine consistent with dehydration. We will continue to give IV fluids. He has mild elevation in his troponin we will recheck this as he does not complain of any chest pain.  Nurse notes the patient has had recent episodes of hypoxia while sleeping she did  start him on oxygen. He does not have any tachypnea.  Patient reports he is still feeling too weak and wife does not feel comfortable taking him home I will discuss with the hospitalist for admission   ____________________________________________   FINAL CLINICAL IMPRESSION(S) / ED DIAGNOSES  Final diagnoses:  Dehydration  Weakness     Lavonia Drafts, MD 03/10/15 2023

## 2015-03-10 NOTE — ED Notes (Signed)
Pt informed that urine sample was needed. Pt does not currently feel the urge to urinate. Pt told to call nurse when able to provide urine sample.

## 2015-03-10 NOTE — H&P (Signed)
[place] [facility] [place] NAME: Russell Keller    MR#:  [id number]  DATE OF BIRTH:  [birth year]   DATE OF ADMISSION:  03/10/2015  PRIMARY CARE PHYSICIAN: Halina Maidens, MD   REQUESTING/REFERRING PHYSICIAN: Corky Downs  CHIEF COMPLAINT:   Chief Complaint  Patient presents with  . Weakness    HISTORY OF PRESENT ILLNESS:  Russell Keller  is a 70 y.o. male with a known history of multiple myeloma, plasmacytoma who is presenting with generalized weakness. Patient and wife state the progressively worsening weakness and generalized over the past few days. Also notes having upper extremity and hand shaking. States he feels fatigued but denies any further symptomatology.  PAST MEDICAL HISTORY:   Past Medical History  Diagnosis Date  . Diabetes mellitus without complication ([place])   . Cancer ([place])   . DVT (deep venous thrombosis) ([place])   . Hypertension   . COPD (chronic obstructive pulmonary disease) ([place])   . Diabetes mellitus without complication [facility])     Patient states he takes Glimeperide  . Plasmocytoma ([place]) 2004  . Multiple myeloma ([place])     PAST SURGICAL HISTORY:   Past Surgical History  Procedure Laterality Date  . Peripheral vascular catheterization N/A 08/22/2014    Procedure: Glori Luis Cath Insertion;  Surgeon: Katha Cabal, MD;  Location: [facility];  Service: Cardiovascular;  Laterality: N/A;  . Limbal stem cell transplant      Multiple Myleoma  . Tonsillectomy    . 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 Cataract Right   . Ileostomy closure  2012    SOCIAL HISTORY:   Social History  Substance Use Topics  . Smoking status: Current Every Day Smoker -- 0.50 packs/day for 50 years    Types: Cigarettes  . Smokeless tobacco: Never Used  . Alcohol Use: No    FAMILY HISTORY:   Family History  Problem Relation Age of Onset  . Diabetes Mother   . Cancer Mother   . Kidney disease Mother    . Prostate cancer Neg Hx     DRUG ALLERGIES:  No Known Allergies  REVIEW OF SYSTEMS:  REVIEW OF SYSTEMS:  CONSTITUTIONAL: Denies fevers, chills, positive fatigue, weakness.  EYES: Denies blurred vision, double vision, or eye pain.  EARS, NOSE, THROAT: Denies tinnitus, ear pain, hearing loss.  RESPIRATORY: denies cough, shortness of breath, wheezing  CARDIOVASCULAR: Denies chest pain, palpitations, edema.  GASTROINTESTINAL: Denies nausea, vomiting, diarrhea, abdominal pain.  GENITOURINARY: Denies dysuria, hematuria.  ENDOCRINE: Denies nocturia or thyroid problems. HEMATOLOGIC AND LYMPHATIC: Denies easy bruising or bleeding.  SKIN: Denies rash or lesions.  MUSCULOSKELETAL: Denies pain in neck, back, shoulder, knees, hips, or further arthritic symptoms.  NEUROLOGIC: Denies paralysis, paresthesias.  PSYCHIATRIC: Denies anxiety or depressive symptoms. Otherwise full review of systems performed by me is negative.   MEDICATIONS AT HOME:   Prior to Admission medications   Medication Sig Start Date End Date Taking? Authorizing Provider  albuterol (PROVENTIL HFA;VENTOLIN HFA) 108 (90 BASE) MCG/ACT inhaler Inhale 2 puffs into the lungs every 6 (six) hours as needed for wheezing or shortness of breath. 08/29/14  Yes Glean Hess, MD  baclofen (LIORESAL) 10 MG tablet TAKE 1 TABLET THREE TIMES DAILY AS NEEDED FOR MUSCLE SPASM(S) Patient taking differently: TAKE 1 TABLET THREE TIMES DAILY. 02/23/15  Yes Lloyd Huger, MD  diazepam (VALIUM) 2 MG tablet Take 1 tablet (2 mg total) by  mouth at bedtime as needed for anxiety. Patient taking differently: Take 2 mg by mouth at bedtime.  02/07/15  Yes Lloyd Huger, MD  gabapentin (NEURONTIN) 300 MG capsule Take 300 mg by mouth 2 (two) times daily. Reported on 03/06/2015   Yes Historical Provider, MD  glimepiride (AMARYL) 2 MG tablet TAKE 1 TABLET TWICE DAILY 03/02/15  Yes Glean Hess, MD  ibuprofen (ADVIL,MOTRIN) 800 MG tablet TAKE 1  TABLET THREE TIMES DAILY 02/07/15  Yes Lloyd Huger, MD  ipratropium-albuterol (DUONEB) 0.5-2.5 (3) MG/3ML SOLN Take 3 mLs by nebulization every 6 (six) hours as needed. 08/04/14  Yes Aldean Jewett, MD  lidocaine-prilocaine (EMLA) cream Apply 1 application topically as needed. Apply to port then cover with saran wrap 1-2 hours before appointment 10/09/14  Yes Lloyd Huger, MD  Melatonin 5 MG TABS Take 5 mg by mouth at bedtime.    Yes Historical Provider, MD  morphine (MS CONTIN) 15 MG 12 hr tablet Take 1 tablet (15 mg total) by mouth every 8 (eight) hours. 02/22/15  Yes Lloyd Huger, MD  omeprazole (PRILOSEC) 20 MG capsule Take 20 mg by mouth daily as needed (for indigestion/heartburn).    Yes Historical Provider, MD  oxyCODONE (OXY IR/ROXICODONE) 5 MG immediate release tablet Take 1 tablet (5 mg total) by mouth every 4 (four) hours as needed for severe pain. 02/20/15  Yes Lloyd Huger, MD  prednisoLONE acetate (PRED FORTE) 1 % ophthalmic suspension Place 1 drop into the right eye 2 (two) times daily.   Yes Historical Provider, MD  prochlorperazine (COMPAZINE) 10 MG tablet Take 1 tablet (10 mg total) by mouth every 6 (six) hours as needed for nausea or vomiting. Reported on 03/06/2015 03/08/15  Yes Lloyd Huger, MD  valACYclovir (VALTREX) 500 MG tablet Take 500 mg by mouth daily.   Yes Historical Provider, MD  azelastine (ASTELIN) 0.1 % nasal spray Place 2 sprays into both nostrils 2 (two) times daily. Use in each nostril as directed 08/10/14   Glean Hess, MD  dexamethasone (DECADRON) 4 MG tablet TAKE 5 TABLETS ONE TIME WEEKLY ON MONDAY  07/11/14   Lloyd Huger, MD  megestrol (MEGACE ES) 625 MG/5ML suspension Take 5 mLs (625 mg total) by mouth daily. 03/08/15   Lloyd Huger, MD  predniSONE (DELTASONE) 20 MG tablet Take 2 tablets (40 mg total) by mouth daily. Patient not taking: Reported on 03/06/2015 02/25/15   Harvest Dark, MD      VITAL SIGNS:  Blood  pressure 139/70, pulse 91, temperature 98.5 F (36.9 C), temperature source Oral, resp. rate 156, height 5' 10"  (1.778 m), weight 156 lb (70.761 kg), SpO2 96 %.  PHYSICAL EXAMINATION:  VITAL SIGNS: Filed Vitals:   03/10/15 2030 03/10/15 2056  BP: 139/70   Pulse:  91  Temp:    Resp:     GENERAL:69 y.o.male currently in no acute distress. Chronically ill-appearing HEAD: Normocephalic, atraumatic.  EYES: Pupils equal, round, reactive to light. Extraocular muscles intact. No scleral icterus.  MOUTH: Moist mucosal membrane. Dentition intact. No abscess noted.  EAR, NOSE, THROAT: Clear without exudates. No external lesions.  NECK: Supple. No thyromegaly. No nodules. No JVD.  PULMONARY: Clear to ascultation, without wheeze rails or rhonci. No use of accessory muscles, Good respiratory effort. good air entry bilaterally CHEST: Nontender to palpation.  CARDIOVASCULAR: S1 and S2. Regular rate and rhythm. No murmurs, rubs, or gallops. No edema. Pedal pulses 2+ bilaterally.  GASTROINTESTINAL: Soft, nontender, nondistended.  No masses. Positive bowel sounds. No hepatosplenomegaly.  MUSCULOSKELETAL: No swelling, clubbing, or edema. Range of motion full in all extremities.  NEUROLOGIC: Resting tremor present bilateral upper extremities Cranial nerves II through XII are intact. Strength actually 5/5 upper and lower extremities including flexion extension proximal distal No gross focal neurological deficits. Sensation intact. Reflexes intact.  SKIN: No ulceration, lesions, rashes, or cyanosis. Skin warm and dry. Turgor intact.  PSYCHIATRIC: Mood, affect within normal limits. The patient is awake, alert and oriented x 3. Insight, judgment intact.    LABORATORY PANEL:   CBC  Recent Labs Lab 03/10/15 1626  WBC 4.9  HGB 10.0*  HCT 29.7*  PLT 136*   ------------------------------------------------------------------------------------------------------------------  Chemistries   Recent Labs Lab  03/10/15 1626  NA 139  K 2.8*  CL 103  CO2 30  GLUCOSE 66  BUN 27*  CREATININE 1.47*  CALCIUM 11.5*  AST 31  ALT 19  ALKPHOS 89  BILITOT 0.3   ------------------------------------------------------------------------------------------------------------------  Cardiac Enzymes  Recent Labs Lab 03/10/15 1946  TROPONINI <0.03   ------------------------------------------------------------------------------------------------------------------  RADIOLOGY:  Dg Chest Portable 1 View  03/10/2015  CLINICAL DATA:  Multiple myeloma.  Weakness. EXAM: PORTABLE CHEST 1 VIEW COMPARISON:  02/25/2015 FINDINGS: Left Port-A-Cath remains in place, unchanged. Heart and mediastinal contours are within normal limits. No focal opacities or effusions. No acute bony abnormality. IMPRESSION: No active cardiopulmonary disease. Electronically Signed   By: Rolm Baptise M.D.   On: 03/10/2015 16:15    EKG:   Orders placed or performed in visit on 03/10/15  . EKG 12-Lead    IMPRESSION AND PLAN:   70 year old Caucasian gentleman history of multiple myeloma presenting with generalized weakness  1. Generalized weakness in the setting of hypercalcemia: He does have a history of hypercalcemia we'll provide IV fluid hydration and follow calcium levels, consult oncology 2. Hypokalemia: Replace potassium 04-5, check magnesium 3. GERD without esophagitis: PPI therapy 4. Type 2 diabetes non-insulin-requiring: Hold oral agents at insulin sliding scale 5. Venous thrombi embolism prophylactic: Heparin subcutaneous    All the records are reviewed and case discussed with ED provider. Management plans discussed with the patient, family and they are in agreement.  CODE STATUS: Full  TOTAL TIME TAKING CARE OF THIS PATIENT: 40 minutes.    Hower,  Karenann Cai.D on 03/10/2015 at 9:01 PM  Between 7am to 6pm - Pager - [phone]  After 6pm: House Pager: - [place] [facility]  Office   [phone]  CC: Primary care physician; Halina Maidens, MD

## 2015-03-10 NOTE — Care Management Obs Status (Signed)
[facility] NOTIFICATION   Patient Details  Name: Russell Keller MRN: [id number] Date of Birth: May 18, 1945   Medicare Observation Status Notification Given:  Yes    CrutchfieldAntony Haste, RN 03/10/2015, 9:01 PM

## 2015-03-10 NOTE — ED Notes (Signed)
MD Kinner notified of pt's postassium of 2.8

## 2015-03-10 NOTE — ED Notes (Addendum)
States he "cant hold anything", wife says she has to hold her hands under everything he picks up, states trouble walking and keeping his head up, states hx of multiple myleoma , states he is scheduled for a CT abd biopsy on Monday because they found enlarge lymph nodes in his abd pressing on his diaphragm

## 2015-03-10 NOTE — ED Notes (Signed)
Pt refused peripheral IV stick in triage, states he has a port and wants it accessed when he gets to room

## 2015-03-10 NOTE — ED Notes (Signed)
Called floor to check on patient's bed status. Was informed floor would could back in a few minutes

## 2015-03-11 DIAGNOSIS — C8593 Non-Hodgkin lymphoma, unspecified, intra-abdominal lymph nodes: Secondary | ICD-10-CM | POA: Diagnosis present

## 2015-03-11 DIAGNOSIS — K219 Gastro-esophageal reflux disease without esophagitis: Secondary | ICD-10-CM | POA: Diagnosis present

## 2015-03-11 DIAGNOSIS — E1140 Type 2 diabetes mellitus with diabetic neuropathy, unspecified: Secondary | ICD-10-CM | POA: Diagnosis present

## 2015-03-11 DIAGNOSIS — R590 Localized enlarged lymph nodes: Secondary | ICD-10-CM

## 2015-03-11 DIAGNOSIS — Z833 Family history of diabetes mellitus: Secondary | ICD-10-CM | POA: Diagnosis not present

## 2015-03-11 DIAGNOSIS — R591 Generalized enlarged lymph nodes: Secondary | ICD-10-CM | POA: Diagnosis not present

## 2015-03-11 DIAGNOSIS — Z9841 Cataract extraction status, right eye: Secondary | ICD-10-CM | POA: Diagnosis not present

## 2015-03-11 DIAGNOSIS — C9000 Multiple myeloma not having achieved remission: Secondary | ICD-10-CM

## 2015-03-11 DIAGNOSIS — Z79899 Other long term (current) drug therapy: Secondary | ICD-10-CM | POA: Diagnosis not present

## 2015-03-11 DIAGNOSIS — E876 Hypokalemia: Secondary | ICD-10-CM | POA: Diagnosis present

## 2015-03-11 DIAGNOSIS — I10 Essential (primary) hypertension: Secondary | ICD-10-CM | POA: Diagnosis present

## 2015-03-11 DIAGNOSIS — F17210 Nicotine dependence, cigarettes, uncomplicated: Secondary | ICD-10-CM | POA: Diagnosis present

## 2015-03-11 DIAGNOSIS — R251 Tremor, unspecified: Secondary | ICD-10-CM | POA: Diagnosis present

## 2015-03-11 DIAGNOSIS — C9030 Solitary plasmacytoma not having achieved remission: Secondary | ICD-10-CM | POA: Diagnosis present

## 2015-03-11 DIAGNOSIS — Z9889 Other specified postprocedural states: Secondary | ICD-10-CM | POA: Diagnosis not present

## 2015-03-11 DIAGNOSIS — N289 Disorder of kidney and ureter, unspecified: Secondary | ICD-10-CM

## 2015-03-11 DIAGNOSIS — Z841 Family history of disorders of kidney and ureter: Secondary | ICD-10-CM | POA: Diagnosis not present

## 2015-03-11 DIAGNOSIS — J449 Chronic obstructive pulmonary disease, unspecified: Secondary | ICD-10-CM | POA: Diagnosis present

## 2015-03-11 DIAGNOSIS — E860 Dehydration: Secondary | ICD-10-CM | POA: Diagnosis present

## 2015-03-11 DIAGNOSIS — G8929 Other chronic pain: Secondary | ICD-10-CM | POA: Diagnosis present

## 2015-03-11 DIAGNOSIS — Z86718 Personal history of other venous thrombosis and embolism: Secondary | ICD-10-CM | POA: Diagnosis not present

## 2015-03-11 DIAGNOSIS — Z66 Do not resuscitate: Secondary | ICD-10-CM | POA: Diagnosis present

## 2015-03-11 DIAGNOSIS — Z809 Family history of malignant neoplasm, unspecified: Secondary | ICD-10-CM | POA: Diagnosis not present

## 2015-03-11 LAB — BASIC METABOLIC PANEL
Anion gap: 5 (ref 5–15)
BUN: 22 mg/dL — AB (ref 6–20)
CHLORIDE: 107 mmol/L (ref 101–111)
CO2: 32 mmol/L (ref 22–32)
CREATININE: 1.32 mg/dL — AB (ref 0.61–1.24)
Calcium: 10.4 mg/dL — ABNORMAL HIGH (ref 8.9–10.3)
GFR calc Af Amer: >60 mL/min (ref >60)
GFR calc non Af Amer: 53 mL/min — ABNORMAL LOW (ref >60)
GLUCOSE: 31 mg/dL — AB (ref 65–99)
POTASSIUM: 2.6 mmol/L — AB (ref 3.5–5.1)
SODIUM: 144 mmol/L (ref 135–145)

## 2015-03-11 LAB — GLUCOSE, CAPILLARY
GLUCOSE-CAPILLARY: 110 mg/dL — AB (ref 65–99)
GLUCOSE-CAPILLARY: 110 mg/dL — AB (ref 65–99)
GLUCOSE-CAPILLARY: 133 mg/dL — AB (ref 65–99)
GLUCOSE-CAPILLARY: 142 mg/dL — AB (ref 65–99)
GLUCOSE-CAPILLARY: 147 mg/dL — AB (ref 65–99)
GLUCOSE-CAPILLARY: 73 mg/dL (ref 65–99)
Glucose-Capillary: 118 mg/dL — ABNORMAL HIGH (ref 65–99)
Glucose-Capillary: 45 mg/dL — ABNORMAL LOW (ref 65–99)
Glucose-Capillary: 56 mg/dL — ABNORMAL LOW (ref 65–99)

## 2015-03-11 LAB — CBC
HEMATOCRIT: 27.3 % — AB (ref 40.0–52.0)
Hemoglobin: 9.2 g/dL — ABNORMAL LOW (ref 13.0–18.0)
MCH: 30.7 pg (ref 26.0–34.0)
MCHC: 33.5 g/dL (ref 32.0–36.0)
MCV: 91.6 fL (ref 80.0–100.0)
PLATELETS: 131 10*3/uL — AB (ref 150–440)
RBC: 2.98 MIL/uL — ABNORMAL LOW (ref 4.40–5.90)
RDW: 14.7 % — AB (ref 11.5–14.5)
WBC: 4.3 10*3/uL (ref 3.8–10.6)

## 2015-03-11 MED ORDER — DEXTROSE 50 % IV SOLN
25.0000 mL | Freq: Once | INTRAVENOUS | Status: AC
  Administered 2015-03-11: 06:00:00 25 mL via INTRAVENOUS
  Filled 2015-03-11: qty 50

## 2015-03-11 MED ORDER — IPRATROPIUM-ALBUTEROL 0.5-2.5 (3) MG/3ML IN SOLN
3.0000 mL | RESPIRATORY_TRACT | Status: DC | PRN
  Administered 2015-03-11 – 2015-03-13 (×3): 3 mL via RESPIRATORY_TRACT
  Filled 2015-03-11 (×3): qty 3

## 2015-03-11 MED ORDER — SODIUM CHLORIDE 4 MEQ/ML IV SOLN
INTRAVENOUS | Status: DC
  Administered 2015-03-11 – 2015-03-12 (×3): via INTRAVENOUS
  Filled 2015-03-11 (×5): qty 1000

## 2015-03-11 MED ORDER — GLUCOSE 4 G PO CHEW
3.0000 | CHEWABLE_TABLET | Freq: Once | ORAL | Status: AC
  Administered 2015-03-11: 12 g via ORAL
  Filled 2015-03-11: qty 3

## 2015-03-11 MED ORDER — IBUPROFEN 400 MG PO TABS
800.0000 mg | ORAL_TABLET | Freq: Three times a day (TID) | ORAL | Status: DC
  Administered 2015-03-11 – 2015-03-14 (×8): 800 mg via ORAL
  Filled 2015-03-11 (×8): qty 2

## 2015-03-11 MED ORDER — POTASSIUM CHLORIDE CRYS ER 20 MEQ PO TBCR
40.0000 meq | EXTENDED_RELEASE_TABLET | ORAL | Status: DC
  Filled 2015-03-11: qty 2

## 2015-03-11 MED ORDER — POTASSIUM CHLORIDE CRYS ER 20 MEQ PO TBCR
20.0000 meq | EXTENDED_RELEASE_TABLET | Freq: Two times a day (BID) | ORAL | Status: DC
  Administered 2015-03-11 (×2): 20 meq via ORAL
  Filled 2015-03-11 (×2): qty 1

## 2015-03-11 NOTE — Consult Note (Signed)
Patient is a 70 year old male with known history of multiple myeloma now with suspicious renal lesion and worsening lymphadenopathy. Please see clinic note dated March 08, 2015 for further details. Patient admitted overnight to the hospital with increasing weakness and fatigue and hypercalcemia. Calcium levels remain elevated, but are significantly improved since receiving Zometa in clinic several days ago. He has a CT guided biopsy scheduled for Monday to determine the etiology of his new lymphadenopathy. Patient also found to have worsening hypokalemia. Agree with current treatment and management.  Full consult to follow.

## 2015-03-11 NOTE — Consult Note (Signed)
[place]  [phone] [phone]  ID: Nanetta Batty OB: 1945-08-26  MR#: [id number]  BOF#:[id number]  Patient Care Team: Glean Hess, MD as PCP - General (Family Medicine) Algernon Huxley, MD as Referring Physician (Vascular Surgery) Glean Hess, MD (Family Medicine)  CHIEF COMPLAINT:  Chief Complaint  Patient presents with  . Weakness    INTERVAL HISTORY: Patient is a 70 year old male with a known history of progressive multiple myeloma and now with new bulky lymphadenopathy who was admitted to the hospital with worsening generalized weakness and fatigue, hypercalcemia, worsening tremor, and hypoglycemia. Since admission, patient feels significantly improved although not back to his baseline. His tremor has improved and he has no other neurologic complaints. He denies any fevers. He has a poor appetite and has lost weight. He denies any chest pain or shortness of breath. He denies any nausea, vomiting, constipation, or diarrhea. He has no urinary complaints. Patient offers no further specific complaints.   REVIEW OF SYSTEMS:   Review of Systems  Constitutional: Positive for weight loss and malaise/fatigue. Negative for fever.  HENT: Negative.   Respiratory: Negative.  Negative for cough and shortness of breath.   Cardiovascular: Negative.  Negative for chest pain.  Gastrointestinal: Negative.   Genitourinary: Negative.   Musculoskeletal: Positive for back pain and joint pain.  Neurological: Positive for tremors and weakness. Negative for dizziness.    As per HPI. Otherwise, a complete review of systems is negatve.  PAST MEDICAL HISTORY: Past Medical History  Diagnosis Date  . Diabetes mellitus without complication ([place])   . Cancer ([place])   . DVT (deep venous thrombosis) ([place])   . Hypertension   . COPD (chronic obstructive pulmonary disease) ([place])   . Diabetes mellitus without complication [facility])     Patient states he  takes Glimeperide  . 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 Multiple myeloma ([place])     PAST SURGICAL HISTORY: Past Surgical History  Procedure Laterality Date  . Peripheral vascular catheterization N/A 08/22/2014    Procedure: Glori Luis Cath Insertion;  Surgeon: Katha Cabal, MD;  Location: [facility];  Service: Cardiovascular;  Laterality: N/A;  . Limbal stem cell transplant      Multiple Myleoma  . Tonsillectomy    . 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 Cataract Right   . Ileostomy closure  2012    FAMILY HISTORY Family History  Problem Relation Age of Onset  . Diabetes Mother   . Cancer Mother   . Kidney disease Mother   . Prostate cancer Neg Hx        ADVANCED DIRECTIVES:    HEALTH MAINTENANCE: Social History  Substance Use Topics  . Smoking status: Current Every Day Smoker -- 0.50 packs/day for 50 years    Types: Cigarettes  . Smokeless tobacco: Never Used  . Alcohol Use: No     Colonoscopy:  PAP:  Bone density:  Lipid panel:  No Known Allergies  Current Facility-Administered Medications  Medication Dose Route Frequency Provider Last Rate Last Dose  . acetaminophen (TYLENOL) tablet 650 mg  650 mg Oral Q6H PRN Lytle Butte, MD       Or  . acetaminophen (TYLENOL) suppository 650 mg  650 mg Rectal Q6H PRN Lytle Butte, MD      . azelastine (ASTELIN) 0.1 % nasal spray 2 spray  2 spray Each Nare BID Lytle Butte, MD   2 spray at 03/10/15  2259  . baclofen (LIORESAL) tablet 10 mg  10 mg Oral TID Lytle Butte, MD   10 mg at 03/11/15 1312  . dextrose 10 % 1,000 mL with sodium chloride 0.45 %, potassium chloride 40 mEq/L infusion   Intravenous Continuous Harrie Foreman, MD 100 mL/hr at 03/11/15 0705    . diazepam (VALIUM) tablet 2 mg  2 mg Oral QHS Lytle Butte, MD   2 mg at 03/10/15 2338  . gabapentin (NEURONTIN) capsule 300 mg  300 mg Oral BID Lytle Butte, MD   300 mg at 03/11/15 1312  . heparin injection 5,000 Units  5,000 Units  Subcutaneous 3 times per day Lytle Butte, MD   5,000 Units at 03/10/15 2338  . insulin aspart (novoLOG) injection 0-5 Units  0-5 Units Subcutaneous QHS Lytle Butte, MD   0 Units at 03/10/15 2258  . insulin aspart (novoLOG) injection 0-9 Units  0-9 Units Subcutaneous TID WC Lytle Butte, MD   1 Units at 03/11/15 1313  . ipratropium-albuterol (DUONEB) 0.5-2.5 (3) MG/3ML nebulizer solution 3 mL  3 mL Nebulization Q4H PRN Henreitta Leber, MD      . megestrol (MEGACE) 400 MG/10ML suspension 625 mg  625 mg Oral Daily Lytle Butte, MD   400 mg at 03/11/15 1314  . morphine (MS CONTIN) 12 hr tablet 15 mg  15 mg Oral 3 times per day Lytle Butte, MD   15 mg at 03/11/15 0845  . morphine 2 MG/ML injection 2 mg  2 mg Intravenous Q4H PRN Lytle Butte, MD   2 mg at 03/10/15 2218  . ondansetron (ZOFRAN) tablet 4 mg  4 mg Oral Q6H PRN Lytle Butte, MD       Or  . ondansetron [facility]) injection 4 mg  4 mg Intravenous Q6H PRN Lytle Butte, MD      . oxyCODONE (Oxy IR/ROXICODONE) immediate release tablet 5 mg  5 mg Oral Q4H PRN Lytle Butte, MD      . pantoprazole (PROTONIX) EC tablet 40 mg  40 mg Oral Daily Lytle Butte, MD   40 mg at 03/11/15 1314  . potassium chloride SA (K-DUR,KLOR-CON) CR tablet 20 mEq  20 mEq Oral BID Henreitta Leber, MD   20 mEq at 03/11/15 1312  . prednisoLONE acetate (PRED FORTE) 1 % ophthalmic suspension 1 drop  1 drop Right Eye BID Lytle Butte, MD   1 drop at 03/11/15 1313  . valACYclovir (VALTREX) tablet 500 mg  500 mg Oral Daily Lytle Butte, MD   500 mg at 03/11/15 1312    OBJECTIVE: Filed Vitals:   03/10/15 2244 03/11/15 0504  BP: 118/62 96/54  Pulse: 90 79  Temp: 97.8 F (36.6 C) 97.5 F (36.4 C)  Resp: 20 18     Body mass index is 22.38 kg/(m^2).    ECOG FS:2 - Symptomatic, <50% confined to bed  General: Well-developed, well-nourished, no acute distress. Eyes: Pink conjunctiva, anicteric sclera. HEENT: Normocephalic, moist mucous membranes, clear  oropharnyx. Lungs: Clear to auscultation bilaterally. Heart: Regular rate and rhythm. No rubs, murmurs, or gallops. Abdomen: Soft, nontender, nondistended. No organomegaly noted, normoactive bowel sounds. Musculoskeletal: No edema, cyanosis, or clubbing. Neuro: Alert, answering all questions appropriately. Cranial nerves grossly intact. Skin: No rashes or petechiae noted. Psych: Normal affect. Lymphatics: No cervical, calvicular, axillary or inguinal LAD.   LAB RESULTS:  Lab Results  Component Value Date   NA 144  03/11/2015   K 2.6* 03/11/2015   CL 107 03/11/2015   CO2 32 03/11/2015   GLUCOSE 31* 03/11/2015   BUN 22* 03/11/2015   CREATININE 1.32* 03/11/2015   CALCIUM 10.4* 03/11/2015   PROT 7.3 03/10/2015   ALBUMIN 3.3* 03/10/2015   AST 31 03/10/2015   ALT 19 03/10/2015   ALKPHOS 89 03/10/2015   BILITOT 0.3 03/10/2015   GFRNONAA 53* 03/11/2015   GFRAA >60 03/11/2015    Lab Results  Component Value Date   WBC 4.3 03/11/2015   NEUTROABS 4.2 03/08/2015   HGB 9.2* 03/11/2015   HCT 27.3* 03/11/2015   MCV 91.6 03/11/2015   PLT 131* 03/11/2015     STUDIES: Ct Angio Chest Pe W/cm &/or Wo Cm  02/28/2015  CLINICAL DATA:  Three-month history of pain across diaphragm with painful breathing. Mass on upper or right mid sided chest for 3 weeks. History of multiple myeloma. EXAM: CT ANGIOGRAPHY CHEST CT ABDOMEN WITH CONTRAST TECHNIQUE: Multidetector CT imaging of the chest was performed using the standard protocol during bolus administration of intravenous contrast. Multiplanar CT image reconstructions and MIPs were obtained to evaluate the vascular anatomy. Multidetector CT imaging of the abdomen was performed using the standard protocol during bolus administration of intravenous contrast. CONTRAST:  164m OMNIPAQUE IOHEXOL 350 MG/ML SOLN COMPARISON:  Chest CT from 08/02/2014. Abdomen and pelvis CT from 08/16/2014. FINDINGS: CTA CHEST FINDINGS Mediastinum / Lymph Nodes: No evidence for  filling defect in the opacified pulmonary arteries to suggest the presence of an acute pulmonary embolus no evidence for dissection of the thoracic aorta. No axillary lymphadenopathy. 3.2 cm short axis prevascular lymph node seen on image 21 series 504. 1.9 cm short axis prevascular lymph node is seen on image 22. No paratracheal or subcarinal lymphadenopathy. No evidence for hilar lymphadenopathy. There is no axillary lymphadenopathy. Tip of the left Port-A-Cath is non central and appears to be positioned at the confluence of the left internal jugular and subclavian veins. The heart size is normal. No pericardial effusion. The esophagus has normal imaging features. 8 x 18 mm left paraspinal nodule is seen on image 29 of series 504. Lungs / Pleura: Lung windows show moderate changes of emphysema bilaterally. No focal airspace consolidation. Small area of tree-in-bud opacity in the posterior right upper lobe is visible on image 36 series 505. No evidence of pulmonary edema. Pleural-parenchymal scarring is noted in both lung apices. No pleural effusion. There is some minimal subsegmental atelectasis in the posterior left lung base. MSK / Soft Tissues: Compression fractures at T7 and T8 are new in the interval. Superior endplate compression fracture at T12 appears relatively stable. There are numerous tiny lucencies within the vertebral bodies of the thoracic spine. Lytic lesions are visible in the sternum and both scapula. Lytic and in some places expansile lesions are seen in the ribs bilaterally. Review of the MIP images confirms the above findings. CT ABDOMEN and PELVIS FINDINGS Hepatobiliary: Scattered tiny hypodensities in the liver parenchyma are likely cysts. There is mild periportal edema. 2.3 cm gallstone evident with mild diffuse gallbladder wall thickening. No intrahepatic or extrahepatic biliary dilation. Pancreas: Pancreas is diffusely atrophic. Spleen: No splenomegaly. No focal mass lesion.  Adrenals/Urinary Tract: No adrenal nodule or mass. 3.5 cm exophytic lesion in the upper pole the left kidney has progressed slightly in size in the interval from 2.5 cm in the same dimension previously. Lesion appears to show low level diffuse enhancement after IV contrast administration. Tiny nonobstructing stones are seen in  the kidneys bilaterally. Tiny cortical cysts are seen in the kidneys bilaterally. No evidence for hydronephrosis. Stomach/Bowel: Stomach is nondistended. No gastric wall thickening. No evidence of outlet obstruction. Duodenum is normally positioned as is the ligament of Treitz. Visualized small bowel and colon is nondilated. Vascular/Lymphatic: There is abdominal aortic atherosclerosis without aneurysm. Bulky right retrocrural lymphadenopathy is new in the interval. Index right retrocrural lymph node measures 2.1 cm in diameter on image 9 series 511. 2.6 cm short axis lymph node is seen in the retro scrotal space, at about the level of the hiatus. In the upper abdomen, there is bulky retroperitoneal lymphadenopathy. 4.7 x 4.7 cm nodal conglomeration is seen in the aortocaval space on image 41 of series 511. There is a large lymph node in the hepatoduodenal ligament and measures 5.7 x 5.5 cm in shows central necrosis. This lesion generates substantial mass effect on the portal vein, displacing it anteriorly although the portal vein does remain patent. SMV and splenic vein are patent. Fat planes around the SMA are preserved. 4.7 x 5.7 cm nodal conglomeration circumferentially encases the inferior IVC (see image 60 series 511). Abnormal lymphadenopathy extends along the right common iliac vessels although this is incompletely visualized on this abdominal CT. Other: No evidence for intraperitoneal free fluid. Musculoskeletal: Multiple lytic and sclerotic lesions are seen in the lumbar spine with near complete collapse of the L3 vertebral body. IMPRESSION: 1. Left-sided Port-A-Cath tip is is not  central, with tip projecting at the confluence of the left IJ and subclavian veins. 2. Prevascular lymphadenopathy in the superior mediastinum with a left paraspinal lesion identified more inferiorly in the thorax. 3. Bulky retrocrural, hepatoduodenal ligament, mesenteric and retroperitoneal lymphadenopathy in the abdomen. One of the largest lymph nodes is necrotic and positioned in the hepatoduodenal ligament, generating substantial mass effect on the portal vein although the portal vein does remain patent. Retroperitoneal lymphadenopathy circumferentially encases the inferior IVC and tracks down along the right iliac vessels although this area was not completely assessed as no CT pelvis was performed as part of this exam. 4. Multiple mainly lytic, but some lytic and sclerotic lesions are seen in the axial and appendicular skeleton. These could be related to the patient's history of multiple myeloma and/or metastatic disease. 5. New compression fractures at T7 and T8. These may be pathologic. T12 compression deformity was present previously and is relatively stable. 6. Slight interval increase in size of the complex lesion in the upper pole the left kidney. This may be a cyst complicated by proteinaceous debris or hemorrhage, but features suggest that there may be some low level enhancement within the lesion. MRI without and with contrast would be the study of choice to further evaluate as neoplasm cannot be excluded. 7. Abdominal aortic atherosclerosis 8. Emphysema. 9. Tree-in-bud opacity in the posterior right upper lobe suggests atypical infection. Electronically Signed   By: Misty Stanley M.D.   On: 02/28/2015 17:10   Ct Abd Wo & W Cm  02/28/2015  CLINICAL DATA:  Three-month history of pain across diaphragm with painful breathing. Mass on upper or right mid sided chest for 3 weeks. History of multiple myeloma. EXAM: CT ANGIOGRAPHY CHEST CT ABDOMEN WITH CONTRAST TECHNIQUE: Multidetector CT imaging of the  chest was performed using the standard protocol during bolus administration of intravenous contrast. Multiplanar CT image reconstructions and MIPs were obtained to evaluate the vascular anatomy. Multidetector CT imaging of the abdomen was performed using the standard protocol during bolus administration of intravenous contrast. CONTRAST:  162m OMNIPAQUE IOHEXOL 350 MG/ML SOLN COMPARISON:  Chest CT from 08/02/2014. Abdomen and pelvis CT from 08/16/2014. FINDINGS: CTA CHEST FINDINGS Mediastinum / Lymph Nodes: No evidence for filling defect in the opacified pulmonary arteries to suggest the presence of an acute pulmonary embolus no evidence for dissection of the thoracic aorta. No axillary lymphadenopathy. 3.2 cm short axis prevascular lymph node seen on image 21 series 504. 1.9 cm short axis prevascular lymph node is seen on image 22. No paratracheal or subcarinal lymphadenopathy. No evidence for hilar lymphadenopathy. There is no axillary lymphadenopathy. Tip of the left Port-A-Cath is non central and appears to be positioned at the confluence of the left internal jugular and subclavian veins. The heart size is normal. No pericardial effusion. The esophagus has normal imaging features. 8 x 18 mm left paraspinal nodule is seen on image 29 of series 504. Lungs / Pleura: Lung windows show moderate changes of emphysema bilaterally. No focal airspace consolidation. Small area of tree-in-bud opacity in the posterior right upper lobe is visible on image 36 series 505. No evidence of pulmonary edema. Pleural-parenchymal scarring is noted in both lung apices. No pleural effusion. There is some minimal subsegmental atelectasis in the posterior left lung base. MSK / Soft Tissues: Compression fractures at T7 and T8 are new in the interval. Superior endplate compression fracture at T12 appears relatively stable. There are numerous tiny lucencies within the vertebral bodies of the thoracic spine. Lytic lesions are visible in the  sternum and both scapula. Lytic and in some places expansile lesions are seen in the ribs bilaterally. Review of the MIP images confirms the above findings. CT ABDOMEN and PELVIS FINDINGS Hepatobiliary: Scattered tiny hypodensities in the liver parenchyma are likely cysts. There is mild periportal edema. 2.3 cm gallstone evident with mild diffuse gallbladder wall thickening. No intrahepatic or extrahepatic biliary dilation. Pancreas: Pancreas is diffusely atrophic. Spleen: No splenomegaly. No focal mass lesion. Adrenals/Urinary Tract: No adrenal nodule or mass. 3.5 cm exophytic lesion in the upper pole the left kidney has progressed slightly in size in the interval from 2.5 cm in the same dimension previously. Lesion appears to show low level diffuse enhancement after IV contrast administration. Tiny nonobstructing stones are seen in the kidneys bilaterally. Tiny cortical cysts are seen in the kidneys bilaterally. No evidence for hydronephrosis. Stomach/Bowel: Stomach is nondistended. No gastric wall thickening. No evidence of outlet obstruction. Duodenum is normally positioned as is the ligament of Treitz. Visualized small bowel and colon is nondilated. Vascular/Lymphatic: There is abdominal aortic atherosclerosis without aneurysm. Bulky right retrocrural lymphadenopathy is new in the interval. Index right retrocrural lymph node measures 2.1 cm in diameter on image 9 series 511. 2.6 cm short axis lymph node is seen in the retro scrotal space, at about the level of the hiatus. In the upper abdomen, there is bulky retroperitoneal lymphadenopathy. 4.7 x 4.7 cm nodal conglomeration is seen in the aortocaval space on image 41 of series 511. There is a large lymph node in the hepatoduodenal ligament and measures 5.7 x 5.5 cm in shows central necrosis. This lesion generates substantial mass effect on the portal vein, displacing it anteriorly although the portal vein does remain patent. SMV and splenic vein are patent. Fat  planes around the SMA are preserved. 4.7 x 5.7 cm nodal conglomeration circumferentially encases the inferior IVC (see image 60 series 511). Abnormal lymphadenopathy extends along the right common iliac vessels although this is incompletely visualized on this abdominal CT. Other: No evidence for intraperitoneal free  fluid. Musculoskeletal: Multiple lytic and sclerotic lesions are seen in the lumbar spine with near complete collapse of the L3 vertebral body. IMPRESSION: 1. Left-sided Port-A-Cath tip is is not central, with tip projecting at the confluence of the left IJ and subclavian veins. 2. Prevascular lymphadenopathy in the superior mediastinum with a left paraspinal lesion identified more inferiorly in the thorax. 3. Bulky retrocrural, hepatoduodenal ligament, mesenteric and retroperitoneal lymphadenopathy in the abdomen. One of the largest lymph nodes is necrotic and positioned in the hepatoduodenal ligament, generating substantial mass effect on the portal vein although the portal vein does remain patent. Retroperitoneal lymphadenopathy circumferentially encases the inferior IVC and tracks down along the right iliac vessels although this area was not completely assessed as no CT pelvis was performed as part of this exam. 4. Multiple mainly lytic, but some lytic and sclerotic lesions are seen in the axial and appendicular skeleton. These could be related to the patient's history of multiple myeloma and/or metastatic disease. 5. New compression fractures at T7 and T8. These may be pathologic. T12 compression deformity was present previously and is relatively stable. 6. Slight interval increase in size of the complex lesion in the upper pole the left kidney. This may be a cyst complicated by proteinaceous debris or hemorrhage, but features suggest that there may be some low level enhancement within the lesion. MRI without and with contrast would be the study of choice to further evaluate as neoplasm cannot be  excluded. 7. Abdominal aortic atherosclerosis 8. Emphysema. 9. Tree-in-bud opacity in the posterior right upper lobe suggests atypical infection. Electronically Signed   By: Misty Stanley M.D.   On: 02/28/2015 17:10   Dg Chest Portable 1 View  03/10/2015  CLINICAL DATA:  Multiple myeloma.  Weakness. EXAM: PORTABLE CHEST 1 VIEW COMPARISON:  02/25/2015 FINDINGS: Left Port-A-Cath remains in place, unchanged. Heart and mediastinal contours are within normal limits. No focal opacities or effusions. No acute bony abnormality. IMPRESSION: No active cardiopulmonary disease. Electronically Signed   By: Rolm Baptise M.D.   On: 03/10/2015 16:15   Dg Chest Portable 1 View  02/25/2015  CLINICAL DATA:  Shortness breath for past couple weeks, pain in ribs with deep breath, COPD, oxygen desaturation, diabetes mellitus, hypertension, COPD, multiple myeloma, smoker EXAM: PORTABLE CHEST 1 VIEW COMPARISON:  Portable exam 1438 hours compared 08/15/2014 FINDINGS: LEFT jugular Port-A-Cath with tip projecting over SVC. Normal heart size, mediastinal contours, and pulmonary vascularity. Atherosclerotic calcification aorta. Emphysematous changes with biapical scarring greater on RIGHT. No definite acute infiltrate, pleural effusion or pneumothorax. Diffuse osseous demineralization without discrete destructive lesion. Question nipple shadows, not definitely seen on prior exams. IMPRESSION: COPD changes with biapical scarring. Question BILATERAL nipple shadow; repeat chest radiograph with nipple markers recommended to exclude pulmonary nodules. Electronically Signed   By: Lavonia Dana M.D.   On: 02/25/2015 14:57    ASSESSMENT: Progressive multiple myeloma, hypercalcemia, hypoglycemia, lymphadenopathy.  PLAN:    1. Multiple myeloma: Continue to hold Revlimid at this time. His M spike seems this trending up and is now 1.6. There is no mention of the area on his chest wall, but his CT as multiple other abnormalities. Patient received  Zometa for his bony lesions on March 08, 2015.  2. Hypercalcemia: Likely secondary to underlying malignancy. Zometa as above. Continue IV fluids. Patient's calcium is significantly improved and nearly back to within normal limits. Monitor. 3. Hypoglycemia: Unclear etiology. Patient currently on Decadron IV. 4. Hypokalemia: Continue IV potassium replacement as ordered. 5. Lymphadenopathy: CT  scan reviewed independently. Patient has new massive lymphadenopathy. This would be unusual presentation for both myeloma and renal cell carcinoma. Patient has been instructed to keep his previously scheduled CT-guided biopsy on Monday, March 12, 2015 for further evaluation. 6. Kidney lesion: Increasing in size. Highly suspicious for underlying malignancy. Case was discussed with urology and given the patient's declining performance status as well as comorbidities will continue to monitor. 7. Port: The tip of patient's port was noted at the confluence of the left subclavian and left jugular vein, this is likely clinically insignificant. 8. Pain: Continue current narcotic regimen. XRT has completed. 9.  Disposition: Patient's symptoms are improving. Okay to discharge from an oncology standpoint after CT-guided biopsy on Monday if everything remains stable.  Appreciate consult, will follow.   Lloyd Huger, MD   03/11/2015 1:16 PM

## 2015-03-11 NOTE — Progress Notes (Signed)
PT Cancellation Note  Patient Details Name: Russell Keller MRN: [id number] DOB: [birth year]   Cancelled Treatment:    Reason Eval/Treat Not Completed: Other (comment) (Pt states he refused PT last time here, not understanding wh)y PT continues to see him.  Agreed to let PT come check again after his procedure (biopsy) in the AM to see if he might want to work with therapy.   Ramond Dial 03/11/2015, 4:41 PM   Mee Hives, PT MS Acute Rehab Dept. Number: [facility] [id number] and [place] [phone]

## 2015-03-11 NOTE — Progress Notes (Signed)
[place]  [phone] [phone]  ID: Russell Keller  MR#: [id number]  RFX#:[id number]  Patient Care Team: Glean Hess, MD as PCP - General (Family Medicine) Algernon Huxley, MD as Referring Physician (Vascular Surgery) Glean Hess, MD (Family Medicine)  CHIEF COMPLAINT:  Multiple myeloma.  INTERVAL HISTORY: Patient returns to clinic today to discuss his imaging results and continuation of Zometa. He continues to have progressive weakness and fatigue. He has a poor appetite and has lost weight. He also has persistent dyspnea on exertion and shortness of breath. He also continues to have back pain and left shoulder pain. He has no neurologic complaints.  He denies any abdominal pain. He denies any fevers, chills, or night sweats.  He denies any nausea, vomiting, constipation, or diarrhea.  He denies any easy bleeding or bruising.  Patient offers no further specific complaints today.  REVIEW OF SYSTEMS:   Review of Systems  Constitutional: Positive for malaise/fatigue. Negative for fever.  Respiratory: Positive for shortness of breath. Negative for cough and hemoptysis.   Cardiovascular: Positive for chest pain.  Gastrointestinal: Negative.   Musculoskeletal: Positive for back pain and joint pain.  Neurological: Negative.   Psychiatric/Behavioral: The patient has insomnia.     As per HPI. Otherwise, a complete review of systems is negatve.  PAST MEDICAL HISTORY: Past Medical History  Diagnosis Date  . Diabetes mellitus without complication ([place])   . Cancer ([place])   . DVT (deep venous thrombosis) ([place])   . Hypertension   . COPD (chronic obstructive pulmonary disease) ([place])   . Diabetes mellitus without complication [facility])     Patient states he takes Glimeperide  . 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 Multiple myeloma ([place])     PAST SURGICAL HISTORY: Past Surgical History  Procedure Laterality Date  . Peripheral  vascular catheterization N/A 08/22/2014    Procedure: Glori Luis Cath Insertion;  Surgeon: Katha Cabal, MD;  Location: [facility];  Service: Cardiovascular;  Laterality: N/A;  . Limbal stem cell transplant      Multiple Myleoma  . Tonsillectomy    . 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 Cataract Right   . Ileostomy closure  2012    FAMILY HISTORY:  Unchanged. No report of malignancy or other chronic disease.     ADVANCED DIRECTIVES:    HEALTH MAINTENANCE: Social History  Substance Use Topics  . Smoking status: Current Every Day Smoker -- 0.50 packs/day for 50 years    Types: Cigarettes  . Smokeless tobacco: Never Used  . Alcohol Use: No     Colonoscopy:  PAP:  Bone density:  Lipid panel:  No Known Allergies  No current facility-administered medications for this visit.   No current outpatient prescriptions on file.   Facility-Administered Medications Ordered in Other Visits  Medication Dose Route Frequency Provider Last Rate Last Dose  . acetaminophen (TYLENOL) tablet 650 mg  650 mg Oral Q6H PRN Lytle Butte, MD       Or  . acetaminophen (TYLENOL) suppository 650 mg  650 mg Rectal Q6H PRN Lytle Butte, MD      . azelastine (ASTELIN) 0.1 % nasal spray 2 spray  2 spray Each Nare BID Lytle Butte, MD   2 spray at 03/10/15 2259  . baclofen (LIORESAL) tablet 10 mg  10 mg Oral TID Lytle Butte, MD   10 mg at 03/10/15 2338  . dextrose  10 % 1,000 mL with sodium chloride 0.45 %, potassium chloride 40 mEq/L infusion   Intravenous Continuous Michael S Diamond, MD 100 mL/hr at 03/11/15 0612    . diazepam (VALIUM) tablet 2 mg  2 mg Oral QHS David K Hower, MD   2 mg at 03/10/15 2338  . gabapentin (NEURONTIN) capsule 300 mg  300 mg Oral BID David K Hower, MD   300 mg at 03/10/15 2338  . heparin injection 5,000 Units  5,000 Units Subcutaneous 3 times per day David K Hower, MD   5,000 Units at 03/10/15 2338  . insulin aspart (novoLOG) injection 0-5 Units   0-5 Units Subcutaneous QHS David K Hower, MD   0 Units at 03/10/15 2258  . insulin aspart (novoLOG) injection 0-9 Units  0-9 Units Subcutaneous TID WC David K Hower, MD      . megestrol (MEGACE) 400 MG/10ML suspension 625 mg  625 mg Oral Daily David K Hower, MD      . morphine (MS CONTIN) 12 hr tablet 15 mg  15 mg Oral 3 times per day David K Hower, MD   15 mg at 03/10/15 2338  . morphine 2 MG/ML injection 2 mg  2 mg Intravenous Q4H PRN David K Hower, MD   2 mg at 03/10/15 2218  . ondansetron (ZOFRAN) tablet 4 mg  4 mg Oral Q6H PRN David K Hower, MD       Or  . ondansetron (ZOFRAN) injection 4 mg  4 mg Intravenous Q6H PRN David K Hower, MD      . oxyCODONE (Oxy IR/ROXICODONE) immediate release tablet 5 mg  5 mg Oral Q4H PRN David K Hower, MD      . pantoprazole (PROTONIX) EC tablet 40 mg  40 mg Oral Daily David K Hower, MD      . potassium chloride SA (K-DUR,KLOR-CON) CR tablet 40 mEq  40 mEq Oral Q4H Michael S Diamond, MD   40 mEq at 03/11/15 0609  . prednisoLONE acetate (PRED FORTE) 1 % ophthalmic suspension 1 drop  1 drop Right Eye BID David K Hower, MD   1 drop at 03/11/15 0021  . valACYclovir (VALTREX) tablet 500 mg  500 mg Oral Daily David K Hower, MD        OBJECTIVE: Filed Vitals:   03/08/15 1128  BP: 101/68  Pulse: 111  Temp: 96.7 F (35.9 C)  Resp: 18     Body mass index is 23.3 kg/(m^2).    ECOG FS:1 - Symptomatic but completely ambulatory  General: Well-developed, well-nourished, no acute distress. Eyes: anicteric sclera. Chest wall: Easily palpable 1-2 cm cystic like mass to the right of sternum. Lungs: Clear to auscultation bilaterally. Heart: Regular rate and rhythm. No rubs, murmurs, or gallops. Abdomen: Soft, nontender, nondistended. No organomegaly noted, normoactive bowel sounds. Musculoskeletal: No edema, cyanosis, or clubbing. Neuro: Alert, answering all questions appropriately. Cranial nerves grossly intact. Skin: No rashes or petechiae noted. Psych: Normal  affect.    LAB RESULTS:  Lab Results  Component Value Date   NA 144 03/11/2015   K 2.6* 03/11/2015   CL 107 03/11/2015   CO2 32 03/11/2015   GLUCOSE 31* 03/11/2015   BUN 22* 03/11/2015   CREATININE 1.32* 03/11/2015   CALCIUM 10.4* 03/11/2015   PROT 7.3 03/10/2015   ALBUMIN 3.3* 03/10/2015   AST 31 03/10/2015   ALT 19 03/10/2015   ALKPHOS 89 03/10/2015   BILITOT 0.3 03/10/2015   GFRNONAA 53* 03/11/2015   GFRAA >60 03/11/2015      Lab Results  Component Value Date   WBC 4.3 03/11/2015   NEUTROABS 4.2 03/08/2015   HGB 9.2* 03/11/2015   HCT 27.3* 03/11/2015   MCV 91.6 03/11/2015   PLT 131* 03/11/2015     STUDIES: Ct Angio Chest Pe W/cm &/or Wo Cm  02/28/2015  CLINICAL DATA:  Three-month history of pain across diaphragm with painful breathing. Mass on upper or right mid sided chest for 3 weeks. History of multiple myeloma. EXAM: CT ANGIOGRAPHY CHEST CT ABDOMEN WITH CONTRAST TECHNIQUE: Multidetector CT imaging of the chest was performed using the standard protocol during bolus administration of intravenous contrast. Multiplanar CT image reconstructions and MIPs were obtained to evaluate the vascular anatomy. Multidetector CT imaging of the abdomen was performed using the standard protocol during bolus administration of intravenous contrast. CONTRAST:  100mL OMNIPAQUE IOHEXOL 350 MG/ML SOLN COMPARISON:  Chest CT from 08/02/2014. Abdomen and pelvis CT from 08/16/2014. FINDINGS: CTA CHEST FINDINGS Mediastinum / Lymph Nodes: No evidence for filling defect in the opacified pulmonary arteries to suggest the presence of an acute pulmonary embolus no evidence for dissection of the thoracic aorta. No axillary lymphadenopathy. 3.2 cm short axis prevascular lymph node seen on image 21 series 504. 1.9 cm short axis prevascular lymph node is seen on image 22. No paratracheal or subcarinal lymphadenopathy. No evidence for hilar lymphadenopathy. There is no axillary lymphadenopathy. Tip of the left  Port-A-Cath is non central and appears to be positioned at the confluence of the left internal jugular and subclavian veins. The heart size is normal. No pericardial effusion. The esophagus has normal imaging features. 8 x 18 mm left paraspinal nodule is seen on image 29 of series 504. Lungs / Pleura: Lung windows show moderate changes of emphysema bilaterally. No focal airspace consolidation. Small area of tree-in-bud opacity in the posterior right upper lobe is visible on image 36 series 505. No evidence of pulmonary edema. Pleural-parenchymal scarring is noted in both lung apices. No pleural effusion. There is some minimal subsegmental atelectasis in the posterior left lung base. MSK / Soft Tissues: Compression fractures at T7 and T8 are new in the interval. Superior endplate compression fracture at T12 appears relatively stable. There are numerous tiny lucencies within the vertebral bodies of the thoracic spine. Lytic lesions are visible in the sternum and both scapula. Lytic and in some places expansile lesions are seen in the ribs bilaterally. Review of the MIP images confirms the above findings. CT ABDOMEN and PELVIS FINDINGS Hepatobiliary: Scattered tiny hypodensities in the liver parenchyma are likely cysts. There is mild periportal edema. 2.3 cm gallstone evident with mild diffuse gallbladder wall thickening. No intrahepatic or extrahepatic biliary dilation. Pancreas: Pancreas is diffusely atrophic. Spleen: No splenomegaly. No focal mass lesion. Adrenals/Urinary Tract: No adrenal nodule or mass. 3.5 cm exophytic lesion in the upper pole the left kidney has progressed slightly in size in the interval from 2.5 cm in the same dimension previously. Lesion appears to show low level diffuse enhancement after IV contrast administration. Tiny nonobstructing stones are seen in the kidneys bilaterally. Tiny cortical cysts are seen in the kidneys bilaterally. No evidence for hydronephrosis. Stomach/Bowel: Stomach is  nondistended. No gastric wall thickening. No evidence of outlet obstruction. Duodenum is normally positioned as is the ligament of Treitz. Visualized small bowel and colon is nondilated. Vascular/Lymphatic: There is abdominal aortic atherosclerosis without aneurysm. Bulky right retrocrural lymphadenopathy is new in the interval. Index right retrocrural lymph node measures 2.1 cm in diameter on image 9 series 511. 2.6   cm short axis lymph node is seen in the retro scrotal space, at about the level of the hiatus. In the upper abdomen, there is bulky retroperitoneal lymphadenopathy. 4.7 x 4.7 cm nodal conglomeration is seen in the aortocaval space on image 41 of series 511. There is a large lymph node in the hepatoduodenal ligament and measures 5.7 x 5.5 cm in shows central necrosis. This lesion generates substantial mass effect on the portal vein, displacing it anteriorly although the portal vein does remain patent. SMV and splenic vein are patent. Fat planes around the SMA are preserved. 4.7 x 5.7 cm nodal conglomeration circumferentially encases the inferior IVC (see image 60 series 511). Abnormal lymphadenopathy extends along the right common iliac vessels although this is incompletely visualized on this abdominal CT. Other: No evidence for intraperitoneal free fluid. Musculoskeletal: Multiple lytic and sclerotic lesions are seen in the lumbar spine with near complete collapse of the L3 vertebral body. IMPRESSION: 1. Left-sided Port-A-Cath tip is is not central, with tip projecting at the confluence of the left IJ and subclavian veins. 2. Prevascular lymphadenopathy in the superior mediastinum with a left paraspinal lesion identified more inferiorly in the thorax. 3. Bulky retrocrural, hepatoduodenal ligament, mesenteric and retroperitoneal lymphadenopathy in the abdomen. One of the largest lymph nodes is necrotic and positioned in the hepatoduodenal ligament, generating substantial mass effect on the portal vein  although the portal vein does remain patent. Retroperitoneal lymphadenopathy circumferentially encases the inferior IVC and tracks down along the right iliac vessels although this area was not completely assessed as no CT pelvis was performed as part of this exam. 4. Multiple mainly lytic, but some lytic and sclerotic lesions are seen in the axial and appendicular skeleton. These could be related to the patient's history of multiple myeloma and/or metastatic disease. 5. New compression fractures at T7 and T8. These may be pathologic. T12 compression deformity was present previously and is relatively stable. 6. Slight interval increase in size of the complex lesion in the upper pole the left kidney. This may be a cyst complicated by proteinaceous debris or hemorrhage, but features suggest that there may be some low level enhancement within the lesion. MRI without and with contrast would be the study of choice to further evaluate as neoplasm cannot be excluded. 7. Abdominal aortic atherosclerosis 8. Emphysema. 9. Tree-in-bud opacity in the posterior right upper lobe suggests atypical infection. Electronically Signed   By: Eric  Mansell M.D.   On: 02/28/2015 17:10   Ct Abd Wo & W Cm  02/28/2015  CLINICAL DATA:  Three-month history of pain across diaphragm with painful breathing. Mass on upper or right mid sided chest for 3 weeks. History of multiple myeloma. EXAM: CT ANGIOGRAPHY CHEST CT ABDOMEN WITH CONTRAST TECHNIQUE: Multidetector CT imaging of the chest was performed using the standard protocol during bolus administration of intravenous contrast. Multiplanar CT image reconstructions and MIPs were obtained to evaluate the vascular anatomy. Multidetector CT imaging of the abdomen was performed using the standard protocol during bolus administration of intravenous contrast. CONTRAST:  100mL OMNIPAQUE IOHEXOL 350 MG/ML SOLN COMPARISON:  Chest CT from 08/02/2014. Abdomen and pelvis CT from 08/16/2014. FINDINGS: CTA  CHEST FINDINGS Mediastinum / Lymph Nodes: No evidence for filling defect in the opacified pulmonary arteries to suggest the presence of an acute pulmonary embolus no evidence for dissection of the thoracic aorta. No axillary lymphadenopathy. 3.2 cm short axis prevascular lymph node seen on image 21 series 504. 1.9 cm short axis prevascular lymph node is seen   on image 22. No paratracheal or subcarinal lymphadenopathy. No evidence for hilar lymphadenopathy. There is no axillary lymphadenopathy. Tip of the left Port-A-Cath is non central and appears to be positioned at the confluence of the left internal jugular and subclavian veins. The heart size is normal. No pericardial effusion. The esophagus has normal imaging features. 8 x 18 mm left paraspinal nodule is seen on image 29 of series 504. Lungs / Pleura: Lung windows show moderate changes of emphysema bilaterally. No focal airspace consolidation. Small area of tree-in-bud opacity in the posterior right upper lobe is visible on image 36 series 505. No evidence of pulmonary edema. Pleural-parenchymal scarring is noted in both lung apices. No pleural effusion. There is some minimal subsegmental atelectasis in the posterior left lung base. MSK / Soft Tissues: Compression fractures at T7 and T8 are new in the interval. Superior endplate compression fracture at T12 appears relatively stable. There are numerous tiny lucencies within the vertebral bodies of the thoracic spine. Lytic lesions are visible in the sternum and both scapula. Lytic and in some places expansile lesions are seen in the ribs bilaterally. Review of the MIP images confirms the above findings. CT ABDOMEN and PELVIS FINDINGS Hepatobiliary: Scattered tiny hypodensities in the liver parenchyma are likely cysts. There is mild periportal edema. 2.3 cm gallstone evident with mild diffuse gallbladder wall thickening. No intrahepatic or extrahepatic biliary dilation. Pancreas: Pancreas is diffusely atrophic.  Spleen: No splenomegaly. No focal mass lesion. Adrenals/Urinary Tract: No adrenal nodule or mass. 3.5 cm exophytic lesion in the upper pole the left kidney has progressed slightly in size in the interval from 2.5 cm in the same dimension previously. Lesion appears to show low level diffuse enhancement after IV contrast administration. Tiny nonobstructing stones are seen in the kidneys bilaterally. Tiny cortical cysts are seen in the kidneys bilaterally. No evidence for hydronephrosis. Stomach/Bowel: Stomach is nondistended. No gastric wall thickening. No evidence of outlet obstruction. Duodenum is normally positioned as is the ligament of Treitz. Visualized small bowel and colon is nondilated. Vascular/Lymphatic: There is abdominal aortic atherosclerosis without aneurysm. Bulky right retrocrural lymphadenopathy is new in the interval. Index right retrocrural lymph node measures 2.1 cm in diameter on image 9 series 511. 2.6 cm short axis lymph node is seen in the retro scrotal space, at about the level of the hiatus. In the upper abdomen, there is bulky retroperitoneal lymphadenopathy. 4.7 x 4.7 cm nodal conglomeration is seen in the aortocaval space on image 41 of series 511. There is a large lymph node in the hepatoduodenal ligament and measures 5.7 x 5.5 cm in shows central necrosis. This lesion generates substantial mass effect on the portal vein, displacing it anteriorly although the portal vein does remain patent. SMV and splenic vein are patent. Fat planes around the SMA are preserved. 4.7 x 5.7 cm nodal conglomeration circumferentially encases the inferior IVC (see image 60 series 511). Abnormal lymphadenopathy extends along the right common iliac vessels although this is incompletely visualized on this abdominal CT. Other: No evidence for intraperitoneal free fluid. Musculoskeletal: Multiple lytic and sclerotic lesions are seen in the lumbar spine with near complete collapse of the L3 vertebral body.  IMPRESSION: 1. Left-sided Port-A-Cath tip is is not central, with tip projecting at the confluence of the left IJ and subclavian veins. 2. Prevascular lymphadenopathy in the superior mediastinum with a left paraspinal lesion identified more inferiorly in the thorax. 3. Bulky retrocrural, hepatoduodenal ligament, mesenteric and retroperitoneal lymphadenopathy in the abdomen. One of the largest   lymph nodes is necrotic and positioned in the hepatoduodenal ligament, generating substantial mass effect on the portal vein although the portal vein does remain patent. Retroperitoneal lymphadenopathy circumferentially encases the inferior IVC and tracks down along the right iliac vessels although this area was not completely assessed as no CT pelvis was performed as part of this exam. 4. Multiple mainly lytic, but some lytic and sclerotic lesions are seen in the axial and appendicular skeleton. These could be related to the patient's history of multiple myeloma and/or metastatic disease. 5. New compression fractures at T7 and T8. These may be pathologic. T12 compression deformity was present previously and is relatively stable. 6. Slight interval increase in size of the complex lesion in the upper pole the left kidney. This may be a cyst complicated by proteinaceous debris or hemorrhage, but features suggest that there may be some low level enhancement within the lesion. MRI without and with contrast would be the study of choice to further evaluate as neoplasm cannot be excluded. 7. Abdominal aortic atherosclerosis 8. Emphysema. 9. Tree-in-bud opacity in the posterior right upper lobe suggests atypical infection. Electronically Signed   By: Misty Stanley M.D.   On: 02/28/2015 17:10   Dg Chest Portable 1 View  03/10/2015  CLINICAL DATA:  Multiple myeloma.  Weakness. EXAM: PORTABLE CHEST 1 VIEW COMPARISON:  02/25/2015 FINDINGS: Left Port-A-Cath remains in place, unchanged. Heart and mediastinal contours are within normal  limits. No focal opacities or effusions. No acute bony abnormality. IMPRESSION: No active cardiopulmonary disease. Electronically Signed   By: Rolm Baptise M.D.   On: 03/10/2015 16:15   Dg Chest Portable 1 View  02/25/2015  CLINICAL DATA:  Shortness breath for past couple weeks, pain in ribs with deep breath, COPD, oxygen desaturation, diabetes mellitus, hypertension, COPD, multiple myeloma, smoker EXAM: PORTABLE CHEST 1 VIEW COMPARISON:  Portable exam 1438 hours compared 08/15/2014 FINDINGS: LEFT jugular Port-A-Cath with tip projecting over SVC. Normal heart size, mediastinal contours, and pulmonary vascularity. Atherosclerotic calcification aorta. Emphysematous changes with biapical scarring greater on RIGHT. No definite acute infiltrate, pleural effusion or pneumothorax. Diffuse osseous demineralization without discrete destructive lesion. Question nipple shadows, not definitely seen on prior exams. IMPRESSION: COPD changes with biapical scarring. Question BILATERAL nipple shadow; repeat chest radiograph with nipple markers recommended to exclude pulmonary nodules. Electronically Signed   By: Lavonia Dana M.D.   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  PLAN:    1.  Multiple myeloma: Continue to hold Revlimid at this time. His M spike seems this trending up and is now 1.6. There is no mention of the area on his chest wall, but his CT as multiple other abnormalities. Patient wishes to proceed with Zometa today. 2.  Port: The tip of patient's port was noted at the confluence of the left subclavian and left jugular vein, this is likely clinically insignificant. 3.  Kidney mass: Increasing in size. Highly suspicious for underlying malignancy. Case was discussed with urology and given patient's declining performance status as well as other comorbidities will continue to monitor. 4.  Lymphadenopathy: CT scan  reviewed independently and reported as above. Patient has new massive lymphadenopathy. This would be unusual for both myeloma and renal cell carcinoma, therefore will get CT guided biopsy next week to determine the etiology. Patient will return approximately one week after biopsy to discuss the results. 5.  History of DVT/PE: He is no longer  taking Coumadin. Continue 81 mg aspirin.  6.  Pain: Continue current narcotic regimen. XRT has completed. 7.  Shoulder pain: Patient previously was complaining of right shoulder pain, he now has left shoulder pain. Possibly musculoskeletal nature but given his increasing M spike, will monitor closely. 8.  Hypercalcemia: Zometa as above. 9.  Hypokalemia: Patient's potassium at the time of evaluation was 3.3, continue oral supplementation as prescribed.   Patient expressed understanding and was in agreement with this plan. He also understands that He can call clinic at any time with any questions, concerns, or complaints.    Lloyd Huger, MD   03/11/2015 6:59 AM

## 2015-03-11 NOTE — Progress Notes (Signed)
[place] at [place] NAME: Russell Keller    MR#:  [id number]  DATE OF BIRTH:  1945-05-22  SUBJECTIVE:   Patient is here due to generalized weakness and noted to be hypercalcemic. He was also noted to be hypokalemic. Calcium level has improved but remains hypokalemic. Overall still feels weak and has a tremor.  REVIEW OF SYSTEMS:    Review of Systems  Constitutional: Negative for fever and chills.  HENT: Negative for congestion and tinnitus.   Eyes: Negative for blurred vision and double vision.  Respiratory: Negative for cough, shortness of breath and wheezing.   Cardiovascular: Positive for chest pain (left lower chest near the ribs). Negative for orthopnea and PND.  Gastrointestinal: Negative for nausea, vomiting, abdominal pain and diarrhea.  Genitourinary: Negative for dysuria and hematuria.  Neurological: Positive for tremors and weakness. Negative for dizziness, sensory change and focal weakness.  All other systems reviewed and are negative.   Nutrition: Heart Healthy Tolerating Diet: yes Tolerating PT: Await Eval.    DRUG ALLERGIES:  No Known Allergies  VITALS:  Blood pressure 96/54, pulse 79, temperature 97.5 F (36.4 C), temperature source Oral, resp. rate 18, height 5' 10"  (1.778 m), weight 70.761 kg (156 lb), SpO2 99 %.  PHYSICAL EXAMINATION:   Physical Exam  GENERAL:  70 y.o.-year-old patient lying in the bed in no acute distress.  EYES: Pupils equal, round, reactive to light and accommodation. No scleral icterus. Extraocular muscles intact.  HEENT: Head atraumatic, normocephalic. Oropharynx and nasopharynx clear.  NECK:  Supple, no jugular venous distention. No thyroid enlargement, no tenderness.  LUNGS: Normal breath sounds bilaterally, no wheezing, rales, rhonchi. No use of accessory muscles of respiration.  CARDIOVASCULAR: S1, S2 normal. No murmurs, rubs, or gallops.  ABDOMEN: Soft, nontender,  nondistended. Bowel sounds present. No organomegaly or mass.  EXTREMITIES: No cyanosis, clubbing or edema b/l.    NEUROLOGIC: Cranial nerves II through XII are intact. No focal Motor or sensory deficits b/l.  Globally weak.  + Tremor.  PSYCHIATRIC: The patient is alert and oriented x 3. Good affect.   SKIN: No obvious rash, lesion, or ulcer.    LABORATORY PANEL:   CBC  Recent Labs Lab 03/11/15 0457  WBC 4.3  HGB 9.2*  HCT 27.3*  PLT 131*   ------------------------------------------------------------------------------------------------------------------  Chemistries   Recent Labs Lab 03/10/15 1626 03/11/15 0457  NA 139 144  K 2.8* 2.6*  CL 103 107  CO2 30 32  GLUCOSE 66 31*  BUN 27* 22*  CREATININE 1.47* 1.32*  CALCIUM 11.5* 10.4*  MG 1.7  --   AST 31  --   ALT 19  --   ALKPHOS 89  --   BILITOT 0.3  --    ------------------------------------------------------------------------------------------------------------------  Cardiac Enzymes  Recent Labs Lab 03/10/15 1946  TROPONINI <0.03   ------------------------------------------------------------------------------------------------------------------  RADIOLOGY:  Dg Chest Portable 1 View  03/10/2015  CLINICAL DATA:  Multiple myeloma.  Weakness. EXAM: PORTABLE CHEST 1 VIEW COMPARISON:  02/25/2015 FINDINGS: Left Port-A-Cath remains in place, unchanged. Heart and mediastinal contours are within normal limits. No focal opacities or effusions. No acute bony abnormality. IMPRESSION: No active cardiopulmonary disease. Electronically Signed   By: Rolm Baptise M.D.   On: 03/10/2015 16:15     ASSESSMENT AND PLAN:   70 year old male with past medical history of multiple myeloma, diabetes type 2 without, occasion, history of previous DVT, COPD, hypertension, chronic anemia who presented to the hospital with weakness and  noted to be hypercalcemic and also hypokalemic.  #1 hypercalcemia-this is secondary to patient's  underlying multiple myeloma and lytic lesions. -Patient has received some IV fluids and also received Zometa and calcium is improved. -We'll continue to monitor.  #2 hypokalemia-we'll continue to supplement and repeat level in the morning. Patient's magnesium level is normal. - likely due to poor PO intake and will cont. To monitor.   #3 lymphadenopathy-patient was noted to have significant lymphadenopathy on a CT done as an outpatient through her oncologist. he is scheduled to have a CT-guided biopsy next week. -The etiology of this is unclear and the primary source is still unclear.  #4 diabetes type 2 without complication-blood sugars stable now. Continue sliding scale insulin for now.  #5 diabetic neuropathy-continue Neurontin.  #6 chronic pain-due to underlying multiple myeloma with lytic lesions. -Continue MS Contin, IV morphine as needed and oxycodone.  #7 GERD-continue Protonix.  8 COPD-no acute exacerbation - duonebs PRN.     All the records are reviewed and case discussed with Care Management/Social Workerr. Management plans discussed with the patient, family and they are in agreement.  CODE STATUS: Full  DVT Prophylaxis: Heparin subcutaneous  TOTAL TIME TAKING CARE OF THIS PATIENT: 30 minutes.   POSSIBLE D/C IN 1-2 DAYS, DEPENDING ON CLINICAL CONDITION.   Henreitta Leber M.D on 03/11/2015 at 12:11 PM  Between 7am to 6pm - Pager - [phone]  After 6pm go to [email or web address] - password [email or web address] [facility]  Office  [phone]  CC: Primary care physician; Halina Maidens, MD

## 2015-03-12 ENCOUNTER — Ambulatory Visit
Admission: RE | Admit: 2015-03-12 | Discharge: 2015-03-12 | Disposition: A | Discharge status: Home / Self Care | Payer: Commercial Managed Care - HMO | Source: Ambulatory Visit | Attending: Oncology | Admitting: Oncology

## 2015-03-12 DIAGNOSIS — C8593 Non-Hodgkin lymphoma, unspecified, intra-abdominal lymph nodes: Secondary | ICD-10-CM | POA: Insufficient documentation

## 2015-03-12 LAB — BASIC METABOLIC PANEL
Anion gap: 4 — ABNORMAL LOW (ref 5–15)
BUN: 17 mg/dL (ref 6–20)
CHLORIDE: 112 mmol/L — AB (ref 101–111)
CO2: 24 mmol/L (ref 22–32)
Calcium: 9.8 mg/dL (ref 8.9–10.3)
Creatinine, Ser: 1.22 mg/dL (ref 0.61–1.24)
GFR calc non Af Amer: 58 mL/min — ABNORMAL LOW (ref >60)
Glucose, Bld: 171 mg/dL — ABNORMAL HIGH (ref 65–99)
POTASSIUM: 4.3 mmol/L (ref 3.5–5.1)
Sodium: 140 mmol/L (ref 135–145)

## 2015-03-12 LAB — MAGNESIUM: MAGNESIUM: 1.6 mg/dL — AB (ref 1.7–2.4)

## 2015-03-12 LAB — GLUCOSE, CAPILLARY
GLUCOSE-CAPILLARY: 149 mg/dL — AB (ref 65–99)
GLUCOSE-CAPILLARY: 111 mg/dL — AB (ref 65–99)
GLUCOSE-CAPILLARY: 79 mg/dL (ref 65–99)
GLUCOSE-CAPILLARY: 163 mg/dL — AB (ref 65–99)
Glucose-Capillary: 87 mg/dL (ref 65–99)
Glucose-Capillary: 77 mg/dL (ref 65–99)
Glucose-Capillary: 132 mg/dL — ABNORMAL HIGH (ref 65–99)

## 2015-03-12 LAB — APTT: APTT: 30 s (ref 24–36)

## 2015-03-12 LAB — CBC
HCT: 26.6 % — ABNORMAL LOW (ref 40.0–52.0)
HEMOGLOBIN: 8.8 g/dL — AB (ref 13.0–18.0)
MCH: 31.1 pg (ref 26.0–34.0)
MCHC: 33.2 g/dL (ref 32.0–36.0)
MCV: 93.6 fL (ref 80.0–100.0)
PLATELETS: 113 10*3/uL — AB (ref 150–440)
RBC: 2.84 MIL/uL — ABNORMAL LOW (ref 4.40–5.90)
RDW: 15.2 % — AB (ref 11.5–14.5)
WBC: 2.5 10*3/uL — ABNORMAL LOW (ref 3.8–10.6)

## 2015-03-12 LAB — PROTIME-INR
INR: 0.99
INR: 0.99
PROTHROMBIN TIME: 13.3 s (ref 11.4–15.0)
Prothrombin Time: 13.3 seconds (ref 11.4–15.0)

## 2015-03-12 MED ORDER — FENTANYL CITRATE (PF) 100 MCG/2ML IJ SOLN
INTRAMUSCULAR | Status: AC | PRN
  Administered 2015-03-12: 12:00:00 50 ug via INTRAVENOUS

## 2015-03-12 MED ORDER — SODIUM CHLORIDE 0.45 % IV SOLN
INTRAVENOUS | Status: DC
  Administered 2015-03-12 – 2015-03-13 (×3): via INTRAVENOUS

## 2015-03-12 MED ORDER — MAGNESIUM SULFATE 2 GM/50ML IV SOLN
2.0000 g | Freq: Once | INTRAVENOUS | Status: AC
  Administered 2015-03-12: 16:00:00 2 g via INTRAVENOUS
  Filled 2015-03-12: qty 50

## 2015-03-12 MED ORDER — SODIUM CHLORIDE 0.9 % IV SOLN: Freq: Once | INTRAVENOUS | Status: DC

## 2015-03-12 MED ORDER — MIDAZOLAM HCL 5 MG/5ML IJ SOLN
INTRAMUSCULAR | Status: AC | PRN
  Administered 2015-03-12: 12:00:00 0.5 mg via INTRAVENOUS
  Administered 2015-03-12: 12:00:00 1 mg via INTRAVENOUS

## 2015-03-12 MED ORDER — MIDAZOLAM HCL 5 MG/5ML IJ SOLN
INTRAMUSCULAR | Status: AC
  Administered 2015-03-12: 11:00:00
  Filled 2015-03-12: qty 5

## 2015-03-12 MED ORDER — FENTANYL CITRATE (PF) 100 MCG/2ML IJ SOLN
INTRAMUSCULAR | Status: AC
  Administered 2015-03-12: 11:00:00
  Filled 2015-03-12: qty 4

## 2015-03-12 NOTE — Progress Notes (Signed)
Pt had CT guided biopsy today, dressing dry and intact upon return, pt with no noted complaints after returning from biopsy, pt started diet, tolerated a regular diet, ate well for supper, pt talked and interacted with family, pt with no distress or discomfort noted

## 2015-03-12 NOTE — Progress Notes (Signed)
PT Cancellation Note  Patient Details Name: Russell Keller MRN: [id number] DOB: 12/25/1945   Cancelled Treatment:    Reason Eval/Treat Not Completed: Other (comment).  Pt soundly sleeping upon PT arrival and nursing reporting pt has been lethargic since returning from CT biopsy.  Pt's wife and sister present and adamant that they and the pt do NOT want PT and do not feel that the pt is at a point medically where PT is appropriate anymore.  Pt not awake to verify information but pt's family reporting they specifically told ED MD NOT to order PT for pt during this hospital stay.  MD [facility] paged and notified regarding all of above and to discuss with pt regarding whether to keep or cancel PT consult.   Leitha Bleak 03/12/2015, 2:23 PM Leitha Bleak, [place]

## 2015-03-12 NOTE — Procedures (Signed)
Procedure and risks discussed with patient and family, including internal hemorrhage resulting in stroke, MI and/or death. Informed consent obtained from wife. Will perform CT-guided biopsy of right retroperitoneal adenopathy.

## 2015-03-12 NOTE — Progress Notes (Signed)
[place] at [place] NAME: Russell Keller    MR#:  [id number]  DATE OF BIRTH:  12-25-1945  SUBJECTIVE:   Patient is here due to generalized weakness and noted to be hypercalcemic. He was also noted to be hypokalemic. Calcium level have normalized now. Patient is status post CT-guided lymph node biopsy today.  A bit lethargic post procedure.    REVIEW OF SYSTEMS:    Review of Systems  Constitutional: Negative for fever and chills.  HENT: Negative for congestion and tinnitus.   Eyes: Negative for blurred vision and double vision.  Respiratory: Negative for cough, shortness of breath and wheezing.   Cardiovascular: Negative for chest pain (left lower chest near the ribs), orthopnea and PND.  Gastrointestinal: Negative for nausea, vomiting, abdominal pain and diarrhea.  Genitourinary: Negative for dysuria and hematuria.  Neurological: Positive for weakness. Negative for dizziness, tremors, sensory change and focal weakness.  All other systems reviewed and are negative.   Nutrition: Heart Healthy Tolerating Diet: yes Tolerating PT: Await Eval.    DRUG ALLERGIES:  No Known Allergies  VITALS:  Blood pressure 116/70, pulse 97, temperature 97.5 F (36.4 C), temperature source Axillary, resp. rate 16, height _0  (1.778 m), weight 70.761 kg (156 lb), SpO2 99 %.  PHYSICAL EXAMINATION:   Physical Exam  GENERAL:  70 y.o.-year-old patient lying in the bed lethargic but in NAD.   EYES: Pupils equal, round, reactive to light and accommodation. No scleral icterus. Extraocular muscles intact.  HEENT: Head atraumatic, normocephalic. Oropharynx and nasopharynx clear.  NECK:  Supple, no jugular venous distention. No thyroid enlargement, no tenderness.  LUNGS: Normal breath sounds bilaterally, no wheezing, rales, rhonchi. No use of accessory muscles of respiration.  CARDIOVASCULAR: S1, S2 normal. No murmurs, rubs, or gallops.  ABDOMEN: Soft,  nontender, nondistended. Bowel sounds present. No organomegaly or mass.  EXTREMITIES: No cyanosis, clubbing or edema b/l.    NEUROLOGIC: Cranial nerves II through XII are intact. No focal Motor or sensory deficits b/l.  Globally weak.  + Tremor.  PSYCHIATRIC: The patient is alert and oriented x 3.  SKIN: No obvious rash, lesion, or ulcer.    LABORATORY PANEL:   CBC  Recent Labs Lab 03/12/15 0746  WBC 2.5*  HGB 8.8*  HCT 26.6*  PLT 113*   ------------------------------------------------------------------------------------------------------------------  Chemistries   Recent Labs Lab 03/10/15 1626  03/12/15 0535  NA 139  < > 140  K 2.8*  < > 4.3  CL 103  < > 112*  CO2 30  < > 24  GLUCOSE 66  < > 171*  BUN 27*  < > 17  CREATININE 1.47*  < > 1.22  CALCIUM 11.5*  < > 9.8  MG 1.7  --  1.6*  AST 31  --   --   ALT 19  --   --   ALKPHOS 89  --   --   BILITOT 0.3  --   --   < > = values in this interval not displayed. ------------------------------------------------------------------------------------------------------------------  Cardiac Enzymes  Recent Labs Lab 03/10/15 1946  TROPONINI <0.03   ------------------------------------------------------------------------------------------------------------------  RADIOLOGY:  Ct Biopsy  03/12/2015  INDICATION: Right retroperitoneal lymphadenopathy. EXAM: CT BIOPSY MEDICATIONS: None. ANESTHESIA/SEDATION: Fentanyl 50 mcg IV; Versed 2.5 mg IV Moderate Sedation Time:  25 The patient was continuously monitored during the procedure by the interventional radiology nurse under my direct supervision. COMPLICATIONS: None immediate. PROCEDURE: Informed written consent was obtained from the patient's  wife after a thorough discussion of the procedural risks, benefits and alternatives. All questions were addressed. Maximal Sterile Barrier Technique was utilized including mask, sterile gloves, sterile drape, hand hygiene and skin antiseptic.  A timeout was performed prior to the initiation of the procedure. Patient was placed prone on the CT scanner. Right flank region was prepped and draped using sterile technique. Local anesthetic was applied. Under CT guidance, 17 gauge guiding needle was directed toward the margin of right retroperitoneal adenopathy using paraspinal approach. Five core samples were obtained using 18 gauge biopsy needle. The samples were placed in sterile saline and delivered to pathology. Needle was removed. Appropriate dressing was applied. No immediate complications were noted on postprocedure images. IMPRESSION: Under CT guidance, percutaneous biopsy of right retroperitoneal adenopathy was performed. Electronically Signed   By: Marijo Conception, M.D.   On: 03/12/2015 12:50   Dg Chest Portable 1 View  03/10/2015  CLINICAL DATA:  Multiple myeloma.  Weakness. EXAM: PORTABLE CHEST 1 VIEW COMPARISON:  02/25/2015 FINDINGS: Left Port-A-Cath remains in place, unchanged. Heart and mediastinal contours are within normal limits. No focal opacities or effusions. No acute bony abnormality. IMPRESSION: No active cardiopulmonary disease. Electronically Signed   By: Rolm Baptise M.D.   On: 03/10/2015 16:15     ASSESSMENT AND PLAN:   70 year old male with past medical history of multiple myeloma, diabetes type 2 without, occasion, history of previous DVT, COPD, hypertension, chronic anemia who presented to the hospital with weakness and noted to be hypercalcemic and also hypokalemic.  #1 hypercalcemia-this is secondary to patient's underlying multiple myeloma and lytic lesions. -improved w/ IV fluids/Zometa and will monitor.   #2 hypokalemia- also improved w/ supplementation and will monitor.   #3 lymphadenopathy-patient was noted to have significant lymphadenopathy on a CT done as an outpatient through her oncologist.  - s/p CT guided LN biopsy today and cont. Further care as per Oncology.  -The etiology of this is unclear and  the primary source is still unclear.  #4 diabetes type 2 without complication-blood sugars stable now.  - Continue sliding scale insulin for now.  #5 diabetic neuropathy-continue Neurontin.  #6 chronic pain-due to underlying multiple myeloma with lytic lesions. -Continue MS Contin, oxycodone, IV Morphine PRN.  #7 GERD-continue Protonix.  8 COPD-no acute exacerbation - duonebs PRN.     All the records are reviewed and case discussed with Care Management/Social Workerr. Management plans discussed with the patient, family and they are in agreement.  CODE STATUS: Full  DVT Prophylaxis: Heparin subcutaneous  TOTAL TIME TAKING CARE OF THIS PATIENT: 30 minutes.   POSSIBLE D/C IN 1-2 DAYS, DEPENDING ON CLINICAL CONDITION.   Henreitta Leber M.D on 03/12/2015 at 1:23 PM  Between 7am to 6pm - Pager - [phone]  After 6pm go to [email or web address] - password [email or web address]  Office  [phone]  CC: Primary care physician; Halina Maidens, MD

## 2015-03-12 NOTE — Procedures (Signed)
Under CT guidance, biopsy of right retroperitoneal adenopathy was performed. No immediate complication.

## 2015-03-12 NOTE — Progress Notes (Signed)
Pt going for CT guided biopsy at this time, pt with no complaints, no distress or discomfort noted

## 2015-03-13 ENCOUNTER — Inpatient Hospital Stay: Payer: Commercial Managed Care - HMO

## 2015-03-13 ENCOUNTER — Telehealth: Payer: Self-pay | Admitting: *Deleted

## 2015-03-13 DIAGNOSIS — R591 Generalized enlarged lymph nodes: Secondary | ICD-10-CM

## 2015-03-13 DIAGNOSIS — R531 Weakness: Secondary | ICD-10-CM

## 2015-03-13 LAB — GLUCOSE, CAPILLARY
GLUCOSE-CAPILLARY: 117 mg/dL — AB (ref 65–99)
GLUCOSE-CAPILLARY: 211 mg/dL — AB (ref 65–99)
Glucose-Capillary: 185 mg/dL — ABNORMAL HIGH (ref 65–99)
Glucose-Capillary: 169 mg/dL — ABNORMAL HIGH (ref 65–99)

## 2015-03-13 MED ORDER — GADOBENATE DIMEGLUMINE 529 MG/ML IV SOLN
15.0000 mL | Freq: Once | INTRAVENOUS | Status: AC | PRN
  Administered 2015-03-13: 15:00:00 14 mL via INTRAVENOUS

## 2015-03-13 NOTE — Progress Notes (Signed)
[place] at [place] NAME: Russell Keller    MR#:  [id number]  DATE OF BIRTH:  08/05/1945  SUBJECTIVE:   Calcium normalized.  Having some weakness in the upper ext. B/l and trouble holding objects.  Wife at bedside.  No other complaints presently.    REVIEW OF SYSTEMS:    Review of Systems  Constitutional: Negative for fever and chills.  HENT: Negative for congestion and tinnitus.   Eyes: Negative for blurred vision and double vision.  Respiratory: Negative for cough, shortness of breath and wheezing.   Cardiovascular: Negative for chest pain, orthopnea and PND.  Gastrointestinal: Negative for nausea, vomiting, abdominal pain and diarrhea.  Genitourinary: Negative for dysuria and hematuria.  Neurological: Positive for tremors and weakness. Negative for dizziness, sensory change and focal weakness.  All other systems reviewed and are negative.   Nutrition: Heart Healthy Tolerating Diet: yes  DRUG ALLERGIES:  No Known Allergies  VITALS:  Blood pressure 131/67, pulse 93, temperature 98.2 F (36.8 C), temperature source Oral, resp. rate 18, height 5' 10"  (1.778 m), weight 70.761 kg (156 lb), SpO2 99 %.  PHYSICAL EXAMINATION:   Physical Exam  GENERAL:  70 y.o.-year-old patient lying in the bed in NAD EYES: Pupils equal, round, reactive to light and accommodation. No scleral icterus. Extraocular muscles intact.  HEENT: Head atraumatic, normocephalic. Oropharynx and nasopharynx clear.  NECK:  Supple, no jugular venous distention. No thyroid enlargement, no tenderness.  LUNGS: Normal breath sounds bilaterally, no wheezing, rales, rhonchi. No use of accessory muscles of respiration.  CARDIOVASCULAR: S1, S2 normal. No murmurs, rubs, or gallops.  ABDOMEN: Soft, nontender, nondistended. Bowel sounds present. No organomegaly or mass.  EXTREMITIES: No cyanosis, clubbing or edema b/l.    NEUROLOGIC: Cranial nerves II through XII are  intact. No focal Motor or sensory deficits b/l.  Globally weak.  + upper ext. Weakness tremor b/l.    PSYCHIATRIC: The patient is alert and oriented x 3. Flat affect.  SKIN: No obvious rash, lesion, or ulcer.    LABORATORY PANEL:   CBC  Recent Labs Lab 03/12/15 0746  WBC 2.5*  HGB 8.8*  HCT 26.6*  PLT 113*   ------------------------------------------------------------------------------------------------------------------  Chemistries   Recent Labs Lab 03/10/15 1626  03/12/15 0535  NA 139  < > 140  K 2.8*  < > 4.3  CL 103  < > 112*  CO2 30  < > 24  GLUCOSE 66  < > 171*  BUN 27*  < > 17  CREATININE 1.47*  < > 1.22  CALCIUM 11.5*  < > 9.8  MG 1.7  --  1.6*  AST 31  --   --   ALT 19  --   --   ALKPHOS 89  --   --   BILITOT 0.3  --   --   < > = values in this interval not displayed. ------------------------------------------------------------------------------------------------------------------  Cardiac Enzymes  Recent Labs Lab 03/10/15 1946  TROPONINI <0.03   ------------------------------------------------------------------------------------------------------------------  RADIOLOGY:  Ct Biopsy  03/12/2015  INDICATION: Right retroperitoneal lymphadenopathy. EXAM: CT BIOPSY MEDICATIONS: None. ANESTHESIA/SEDATION: Fentanyl 50 mcg IV; Versed 2.5 mg IV Moderate Sedation Time:  25 The patient was continuously monitored during the procedure by the interventional radiology nurse under my direct supervision. COMPLICATIONS: None immediate. PROCEDURE: Informed written consent was obtained from the patient's wife after a thorough discussion of the procedural risks, benefits and alternatives. All questions were addressed. Maximal Sterile Barrier Technique was utilized  including mask, sterile gloves, sterile drape, hand hygiene and skin antiseptic. A timeout was performed prior to the initiation of the procedure. Patient was placed prone on the CT scanner. Right flank region was  prepped and draped using sterile technique. Local anesthetic was applied. Under CT guidance, 17 gauge guiding needle was directed toward the margin of right retroperitoneal adenopathy using paraspinal approach. Five core samples were obtained using 18 gauge biopsy needle. The samples were placed in sterile saline and delivered to pathology. Needle was removed. Appropriate dressing was applied. No immediate complications were noted on postprocedure images. IMPRESSION: Under CT guidance, percutaneous biopsy of right retroperitoneal adenopathy was performed. Electronically Signed   By: Marijo Conception, M.D.   On: 03/12/2015 12:50     ASSESSMENT AND PLAN:   70 year old male with past medical history of multiple myeloma, diabetes type 2 without, occasion, history of previous DVT, COPD, hypertension, chronic anemia who presented to the hospital with weakness and noted to be hypercalcemic and also hypokalemic.  #1 hypercalcemia-this is secondary to patient's underlying multiple myeloma and lytic lesions. - resolved w/ IV fluids, Zometa.    #2 hypokalemia- improved w/ supplementation.   #3 lymphadenopathy-patient was noted to have significant lymphadenopathy on a CT done as an outpatient through her oncologist.  - s/p CT guided LN biopsy POD # 1. Cont. Pain control and care as per Oncology.  -The etiology of this is unclear and the primary source is still unclear.  #4 diabetes type 2 without complication-blood sugars stable now.  - Continue sliding scale insulin for now.  #5 diabetic neuropathy-continue Neurontin.  #6 chronic pain-due to underlying multiple myeloma with lytic lesions. -Continue MS Contin, oxycodone, IV Morphine PRN.  #7 GERD-continue Protonix.  8 COPD-no acute exacerbation - duonebs PRN.   9. Upper Ext. Tremor/Weakness - etiology unclear. Will get MRI Brain with and w/out contrast to r/o metastatic disease.    Pt. Overall prognosis is poor and given his recurrent malignancy  and now having metastatic disease with unknown primary he has agreed to being DNR and go home with Hospice services after further discussion with his Oncologist Dr. Grayland Ormond. Will d/c home w/ Hospice tomorrow.   All the records are reviewed and case discussed with Care Management/Social Workerr. Management plans discussed with the patient, family and they are in agreement.  CODE STATUS: DNR  DVT Prophylaxis: Heparin subcutaneous  TOTAL TIME TAKING CARE OF THIS PATIENT: 35 minutes.   POSSIBLE D/C IN 1-2 DAYS, DEPENDING ON CLINICAL CONDITION.  Greater than 50% of time spent in coordination in care in discussion with pt's family, Dr. Grayland Ormond and also nursing staff   Henreitta Leber M.D on 03/13/2015 at 1:50 PM  Between 7am to 6pm - Pager - [phone]  After 6pm go to [email or web address] - password [email or web address] [facility]  Office  [phone]  CC: Primary care physician; Halina Maidens, MD

## 2015-03-13 NOTE — Consult Note (Signed)
[place]  Telephone:([phone]) [phone] [phone]  ID: Russell Keller OB: [birth year]  MR#: [id number]  TZG#:[id number]  Patient Care Team: Glean Hess, MD as PCP - General (Family Medicine) Algernon Huxley, MD as Referring Physician (Vascular Surgery) Glean Hess, MD (Family Medicine)  CHIEF COMPLAINT:  Chief Complaint  Patient presents with  . Weakness    INTERVAL HISTORY: Patient continues to have significant weakness and fatigue. Tremors are still present, but improved since admission. He continues to have a poor appetite. His pain is well controlled. She offers no further specific complaints.  REVIEW OF SYSTEMS:   Review of Systems  Constitutional: Positive for weight loss and malaise/fatigue. Negative for fever.  Respiratory: Negative.   Cardiovascular: Negative.   Gastrointestinal: Negative.   Musculoskeletal: Negative.   Neurological: Positive for weakness.  Psychiatric/Behavioral:       Confusion.    As per HPI. Otherwise, a complete review of systems is negatve.  PAST MEDICAL HISTORY: Past Medical History  Diagnosis Date  . Diabetes mellitus without complication ([place])   . Cancer ([place])   . DVT (deep venous thrombosis) ([place])   . Hypertension   . COPD (chronic obstructive pulmonary disease) ([place])   . Diabetes mellitus without complication [facility])     Patient states he takes Glimeperide  . 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 Multiple myeloma ([place])     PAST SURGICAL HISTORY: Past Surgical History  Procedure Laterality Date  . Peripheral vascular catheterization N/A 08/22/2014    Procedure: Glori Luis Cath Insertion;  Surgeon: Katha Cabal, MD;  Location: [facility];  Service: Cardiovascular;  Laterality: N/A;  . Limbal stem cell transplant      Multiple Myleoma  . Tonsillectomy    . 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 Cataract Right   . Ileostomy closure  2012    FAMILY HISTORY Family History    Problem Relation Age of Onset  . Diabetes Mother   . Cancer Mother   . Kidney disease Mother   . Prostate cancer Neg Hx        ADVANCED DIRECTIVES:    HEALTH MAINTENANCE: Social History  Substance Use Topics  . Smoking status: Current Every Day Smoker -- 0.50 packs/day for 50 years    Types: Cigarettes  . Smokeless tobacco: Never Used  . Alcohol Use: No     Colonoscopy:  PAP:  Bone density:  Lipid panel:  No Known Allergies  Current Facility-Administered Medications  Medication Dose Route Frequency Provider Last Rate Last Dose  . 0.45 % sodium chloride infusion   Intravenous Continuous Henreitta Leber, MD 75 mL/hr at 03/12/15 1948    . acetaminophen (TYLENOL) tablet 650 mg  650 mg Oral Q6H PRN Lytle Butte, MD       Or  . acetaminophen (TYLENOL) suppository 650 mg  650 mg Rectal Q6H PRN Lytle Butte, MD      . azelastine (ASTELIN) 0.1 % nasal spray 2 spray  2 spray Each Nare BID Lytle Butte, MD   2 spray at 03/10/15 2259  . baclofen (LIORESAL) tablet 10 mg  10 mg Oral TID Lytle Butte, MD   10 mg at 03/13/15 7591  . diazepam (VALIUM) tablet 2 mg  2 mg Oral QHS Lytle Butte, MD   2 mg at 03/12/15 1945  . gabapentin (NEURONTIN) capsule 300 mg  300 mg Oral BID Lytle Butte, MD  300 mg at 03/13/15 0829  . heparin injection 5,000 Units  5,000 Units Subcutaneous 3 times per day Lytle Butte, MD   5,000 Units at 03/13/15 0603  . ibuprofen (ADVIL,MOTRIN) tablet 800 mg  800 mg Oral TID Henreitta Leber, MD   800 mg at 03/13/15 0829  . insulin aspart (novoLOG) injection 0-5 Units  0-5 Units Subcutaneous QHS Lytle Butte, MD   0 Units at 03/10/15 2258  . insulin aspart (novoLOG) injection 0-9 Units  0-9 Units Subcutaneous TID WC Lytle Butte, MD   2 Units at 03/13/15 1211  . ipratropium-albuterol (DUONEB) 0.5-2.5 (3) MG/3ML nebulizer solution 3 mL  3 mL Nebulization Q4H PRN Henreitta Leber, MD   3 mL at 03/12/15 1622  . megestrol (MEGACE) 400 MG/10ML suspension 625 mg  625  mg Oral Daily Lytle Butte, MD   625 mg at 03/13/15 0825  . morphine (MS CONTIN) 12 hr tablet 15 mg  15 mg Oral 3 times per day Lytle Butte, MD   15 mg at 03/13/15 0603  . morphine 2 MG/ML injection 2 mg  2 mg Intravenous Q4H PRN Lytle Butte, MD   2 mg at 03/10/15 2218  . ondansetron (ZOFRAN) tablet 4 mg  4 mg Oral Q6H PRN Lytle Butte, MD       Or  . ondansetron [facility]) injection 4 mg  4 mg Intravenous Q6H PRN Lytle Butte, MD      . oxyCODONE (Oxy IR/ROXICODONE) immediate release tablet 5 mg  5 mg Oral Q4H PRN Lytle Butte, MD      . pantoprazole (PROTONIX) EC tablet 40 mg  40 mg Oral Daily Lytle Butte, MD   40 mg at 03/12/15 1556  . prednisoLONE acetate (PRED FORTE) 1 % ophthalmic suspension 1 drop  1 drop Right Eye BID Lytle Butte, MD   1 drop at 03/13/15 0829  . valACYclovir (VALTREX) tablet 500 mg  500 mg Oral Daily Lytle Butte, MD   500 mg at 03/13/15 0829    OBJECTIVE: Filed Vitals:   03/13/15 0427 03/13/15 1516  BP: 131/67 116/73  Pulse: 93 106  Temp: 98.2 F (36.8 C) 98.6 F (37 C)  Resp: 18 20     Body mass index is 22.38 kg/(m^2).    ECOG FS:4 - Bedbound  General: Ill-appearing, no acute distress. Eyes: Pink conjunctiva, anicteric sclera. Lungs: Clear to auscultation bilaterally. Heart: Regular rate and rhythm. No rubs, murmurs, or gallops. Abdomen: Soft, nontender, nondistended. No organomegaly noted, normoactive bowel sounds. Musculoskeletal: No edema, cyanosis, or clubbing. Neuro: Alert, answering all questions appropriately. Cranial nerves grossly intact. Skin: No rashes or petechiae noted. Psych: Normal affect.   LAB RESULTS:  Lab Results  Component Value Date   NA 140 03/12/2015   K 4.3 03/12/2015   CL 112* 03/12/2015   CO2 24 03/12/2015   GLUCOSE 171* 03/12/2015   BUN 17 03/12/2015   CREATININE 1.22 03/12/2015   CALCIUM 9.8 03/12/2015   PROT 7.3 03/10/2015   ALBUMIN 3.3* 03/10/2015   AST 31 03/10/2015   ALT 19 03/10/2015   ALKPHOS 89  03/10/2015   BILITOT 0.3 03/10/2015   GFRNONAA 58* 03/12/2015   GFRAA >60 03/12/2015    Lab Results  Component Value Date   WBC 2.5* 03/12/2015   NEUTROABS 4.2 03/08/2015   HGB 8.8* 03/12/2015   HCT 26.6* 03/12/2015   MCV 93.6 03/12/2015   PLT 113* 03/12/2015  STUDIES: Ct Angio Chest Pe W/cm &/or Wo Cm  02/28/2015  CLINICAL DATA:  Three-month history of pain across diaphragm with painful breathing. Mass on upper or right mid sided chest for 3 weeks. History of multiple myeloma. EXAM: CT ANGIOGRAPHY CHEST CT ABDOMEN WITH CONTRAST TECHNIQUE: Multidetector CT imaging of the chest was performed using the standard protocol during bolus administration of intravenous contrast. Multiplanar CT image reconstructions and MIPs were obtained to evaluate the vascular anatomy. Multidetector CT imaging of the abdomen was performed using the standard protocol during bolus administration of intravenous contrast. CONTRAST:  151m OMNIPAQUE IOHEXOL 350 MG/ML SOLN COMPARISON:  Chest CT from 08/02/2014. Abdomen and pelvis CT from 08/16/2014. FINDINGS: CTA CHEST FINDINGS Mediastinum / Lymph Nodes: No evidence for filling defect in the opacified pulmonary arteries to suggest the presence of an acute pulmonary embolus no evidence for dissection of the thoracic aorta. No axillary lymphadenopathy. 3.2 cm short axis prevascular lymph node seen on image 21 series 504. 1.9 cm short axis prevascular lymph node is seen on image 22. No paratracheal or subcarinal lymphadenopathy. No evidence for hilar lymphadenopathy. There is no axillary lymphadenopathy. Tip of the left Port-A-Cath is non central and appears to be positioned at the confluence of the left internal jugular and subclavian veins. The heart size is normal. No pericardial effusion. The esophagus has normal imaging features. 8 x 18 mm left paraspinal nodule is seen on image 29 of series 504. Lungs / Pleura: Lung windows show moderate changes of emphysema bilaterally.  No focal airspace consolidation. Small area of tree-in-bud opacity in the posterior right upper lobe is visible on image 36 series 505. No evidence of pulmonary edema. Pleural-parenchymal scarring is noted in both lung apices. No pleural effusion. There is some minimal subsegmental atelectasis in the posterior left lung base. MSK / Soft Tissues: Compression fractures at T7 and T8 are new in the interval. Superior endplate compression fracture at T12 appears relatively stable. There are numerous tiny lucencies within the vertebral bodies of the thoracic spine. Lytic lesions are visible in the sternum and both scapula. Lytic and in some places expansile lesions are seen in the ribs bilaterally. Review of the MIP images confirms the above findings. CT ABDOMEN and PELVIS FINDINGS Hepatobiliary: Scattered tiny hypodensities in the liver parenchyma are likely cysts. There is mild periportal edema. 2.3 cm gallstone evident with mild diffuse gallbladder wall thickening. No intrahepatic or extrahepatic biliary dilation. Pancreas: Pancreas is diffusely atrophic. Spleen: No splenomegaly. No focal mass lesion. Adrenals/Urinary Tract: No adrenal nodule or mass. 3.5 cm exophytic lesion in the upper pole the left kidney has progressed slightly in size in the interval from 2.5 cm in the same dimension previously. Lesion appears to show low level diffuse enhancement after IV contrast administration. Tiny nonobstructing stones are seen in the kidneys bilaterally. Tiny cortical cysts are seen in the kidneys bilaterally. No evidence for hydronephrosis. Stomach/Bowel: Stomach is nondistended. No gastric wall thickening. No evidence of outlet obstruction. Duodenum is normally positioned as is the ligament of Treitz. Visualized small bowel and colon is nondilated. Vascular/Lymphatic: There is abdominal aortic atherosclerosis without aneurysm. Bulky right retrocrural lymphadenopathy is new in the interval. Index right retrocrural lymph  node measures 2.1 cm in diameter on image 9 series 511. 2.6 cm short axis lymph node is seen in the retro scrotal space, at about the level of the hiatus. In the upper abdomen, there is bulky retroperitoneal lymphadenopathy. 4.7 x 4.7 cm nodal conglomeration is seen in the aortocaval space  on image 41 of series 511. There is a large lymph node in the hepatoduodenal ligament and measures 5.7 x 5.5 cm in shows central necrosis. This lesion generates substantial mass effect on the portal vein, displacing it anteriorly although the portal vein does remain patent. SMV and splenic vein are patent. Fat planes around the SMA are preserved. 4.7 x 5.7 cm nodal conglomeration circumferentially encases the inferior IVC (see image 60 series 511). Abnormal lymphadenopathy extends along the right common iliac vessels although this is incompletely visualized on this abdominal CT. Other: No evidence for intraperitoneal free fluid. Musculoskeletal: Multiple lytic and sclerotic lesions are seen in the lumbar spine with near complete collapse of the L3 vertebral body. IMPRESSION: 1. Left-sided Port-A-Cath tip is is not central, with tip projecting at the confluence of the left IJ and subclavian veins. 2. Prevascular lymphadenopathy in the superior mediastinum with a left paraspinal lesion identified more inferiorly in the thorax. 3. Bulky retrocrural, hepatoduodenal ligament, mesenteric and retroperitoneal lymphadenopathy in the abdomen. One of the largest lymph nodes is necrotic and positioned in the hepatoduodenal ligament, generating substantial mass effect on the portal vein although the portal vein does remain patent. Retroperitoneal lymphadenopathy circumferentially encases the inferior IVC and tracks down along the right iliac vessels although this area was not completely assessed as no CT pelvis was performed as part of this exam. 4. Multiple mainly lytic, but some lytic and sclerotic lesions are seen in the axial and  appendicular skeleton. These could be related to the patient's history of multiple myeloma and/or metastatic disease. 5. New compression fractures at T7 and T8. These may be pathologic. T12 compression deformity was present previously and is relatively stable. 6. Slight interval increase in size of the complex lesion in the upper pole the left kidney. This may be a cyst complicated by proteinaceous debris or hemorrhage, but features suggest that there may be some low level enhancement within the lesion. MRI without and with contrast would be the study of choice to further evaluate as neoplasm cannot be excluded. 7. Abdominal aortic atherosclerosis 8. Emphysema. 9. Tree-in-bud opacity in the posterior right upper lobe suggests atypical infection. Electronically Signed   By: Misty Stanley M.D.   On: 02/28/2015 17:10   Ct Abd Wo & W Cm  02/28/2015  CLINICAL DATA:  Three-month history of pain across diaphragm with painful breathing. Mass on upper or right mid sided chest for 3 weeks. History of multiple myeloma. EXAM: CT ANGIOGRAPHY CHEST CT ABDOMEN WITH CONTRAST TECHNIQUE: Multidetector CT imaging of the chest was performed using the standard protocol during bolus administration of intravenous contrast. Multiplanar CT image reconstructions and MIPs were obtained to evaluate the vascular anatomy. Multidetector CT imaging of the abdomen was performed using the standard protocol during bolus administration of intravenous contrast. CONTRAST:  140m OMNIPAQUE IOHEXOL 350 MG/ML SOLN COMPARISON:  Chest CT from 08/02/2014. Abdomen and pelvis CT from 08/16/2014. FINDINGS: CTA CHEST FINDINGS Mediastinum / Lymph Nodes: No evidence for filling defect in the opacified pulmonary arteries to suggest the presence of an acute pulmonary embolus no evidence for dissection of the thoracic aorta. No axillary lymphadenopathy. 3.2 cm short axis prevascular lymph node seen on image 21 series 504. 1.9 cm short axis prevascular lymph node is  seen on image 22. No paratracheal or subcarinal lymphadenopathy. No evidence for hilar lymphadenopathy. There is no axillary lymphadenopathy. Tip of the left Port-A-Cath is non central and appears to be positioned at the confluence of the left internal jugular and  subclavian veins. The heart size is normal. No pericardial effusion. The esophagus has normal imaging features. 8 x 18 mm left paraspinal nodule is seen on image 29 of series 504. Lungs / Pleura: Lung windows show moderate changes of emphysema bilaterally. No focal airspace consolidation. Small area of tree-in-bud opacity in the posterior right upper lobe is visible on image 36 series 505. No evidence of pulmonary edema. Pleural-parenchymal scarring is noted in both lung apices. No pleural effusion. There is some minimal subsegmental atelectasis in the posterior left lung base. MSK / Soft Tissues: Compression fractures at T7 and T8 are new in the interval. Superior endplate compression fracture at T12 appears relatively stable. There are numerous tiny lucencies within the vertebral bodies of the thoracic spine. Lytic lesions are visible in the sternum and both scapula. Lytic and in some places expansile lesions are seen in the ribs bilaterally. Review of the MIP images confirms the above findings. CT ABDOMEN and PELVIS FINDINGS Hepatobiliary: Scattered tiny hypodensities in the liver parenchyma are likely cysts. There is mild periportal edema. 2.3 cm gallstone evident with mild diffuse gallbladder wall thickening. No intrahepatic or extrahepatic biliary dilation. Pancreas: Pancreas is diffusely atrophic. Spleen: No splenomegaly. No focal mass lesion. Adrenals/Urinary Tract: No adrenal nodule or mass. 3.5 cm exophytic lesion in the upper pole the left kidney has progressed slightly in size in the interval from 2.5 cm in the same dimension previously. Lesion appears to show low level diffuse enhancement after IV contrast administration. Tiny nonobstructing  stones are seen in the kidneys bilaterally. Tiny cortical cysts are seen in the kidneys bilaterally. No evidence for hydronephrosis. Stomach/Bowel: Stomach is nondistended. No gastric wall thickening. No evidence of outlet obstruction. Duodenum is normally positioned as is the ligament of Treitz. Visualized small bowel and colon is nondilated. Vascular/Lymphatic: There is abdominal aortic atherosclerosis without aneurysm. Bulky right retrocrural lymphadenopathy is new in the interval. Index right retrocrural lymph node measures 2.1 cm in diameter on image 9 series 511. 2.6 cm short axis lymph node is seen in the retro scrotal space, at about the level of the hiatus. In the upper abdomen, there is bulky retroperitoneal lymphadenopathy. 4.7 x 4.7 cm nodal conglomeration is seen in the aortocaval space on image 41 of series 511. There is a large lymph node in the hepatoduodenal ligament and measures 5.7 x 5.5 cm in shows central necrosis. This lesion generates substantial mass effect on the portal vein, displacing it anteriorly although the portal vein does remain patent. SMV and splenic vein are patent. Fat planes around the SMA are preserved. 4.7 x 5.7 cm nodal conglomeration circumferentially encases the inferior IVC (see image 60 series 511). Abnormal lymphadenopathy extends along the right common iliac vessels although this is incompletely visualized on this abdominal CT. Other: No evidence for intraperitoneal free fluid. Musculoskeletal: Multiple lytic and sclerotic lesions are seen in the lumbar spine with near complete collapse of the L3 vertebral body. IMPRESSION: 1. Left-sided Port-A-Cath tip is is not central, with tip projecting at the confluence of the left IJ and subclavian veins. 2. Prevascular lymphadenopathy in the superior mediastinum with a left paraspinal lesion identified more inferiorly in the thorax. 3. Bulky retrocrural, hepatoduodenal ligament, mesenteric and retroperitoneal lymphadenopathy in  the abdomen. One of the largest lymph nodes is necrotic and positioned in the hepatoduodenal ligament, generating substantial mass effect on the portal vein although the portal vein does remain patent. Retroperitoneal lymphadenopathy circumferentially encases the inferior IVC and tracks down along the right iliac vessels  although this area was not completely assessed as no CT pelvis was performed as part of this exam. 4. Multiple mainly lytic, but some lytic and sclerotic lesions are seen in the axial and appendicular skeleton. These could be related to the patient's history of multiple myeloma and/or metastatic disease. 5. New compression fractures at T7 and T8. These may be pathologic. T12 compression deformity was present previously and is relatively stable. 6. Slight interval increase in size of the complex lesion in the upper pole the left kidney. This may be a cyst complicated by proteinaceous debris or hemorrhage, but features suggest that there may be some low level enhancement within the lesion. MRI without and with contrast would be the study of choice to further evaluate as neoplasm cannot be excluded. 7. Abdominal aortic atherosclerosis 8. Emphysema. 9. Tree-in-bud opacity in the posterior right upper lobe suggests atypical infection. Electronically Signed   By: Misty Stanley M.D.   On: 02/28/2015 17:10   Ct Biopsy  03/12/2015  INDICATION: Right retroperitoneal lymphadenopathy. EXAM: CT BIOPSY MEDICATIONS: None. ANESTHESIA/SEDATION: Fentanyl 50 mcg IV; Versed 2.5 mg IV Moderate Sedation Time:  25 The patient was continuously monitored during the procedure by the interventional radiology nurse under my direct supervision. COMPLICATIONS: None immediate. PROCEDURE: Informed written consent was obtained from the patient's wife after a thorough discussion of the procedural risks, benefits and alternatives. All questions were addressed. Maximal Sterile Barrier Technique was utilized including mask, sterile  gloves, sterile drape, hand hygiene and skin antiseptic. A timeout was performed prior to the initiation of the procedure. Patient was placed prone on the CT scanner. Right flank region was prepped and draped using sterile technique. Local anesthetic was applied. Under CT guidance, 17 gauge guiding needle was directed toward the margin of right retroperitoneal adenopathy using paraspinal approach. Five core samples were obtained using 18 gauge biopsy needle. The samples were placed in sterile saline and delivered to pathology. Needle was removed. Appropriate dressing was applied. No immediate complications were noted on postprocedure images. IMPRESSION: Under CT guidance, percutaneous biopsy of right retroperitoneal adenopathy was performed. Electronically Signed   By: Marijo Conception, M.D.   On: 03/12/2015 12:50   Dg Chest Portable 1 View  03/10/2015  CLINICAL DATA:  Multiple myeloma.  Weakness. EXAM: PORTABLE CHEST 1 VIEW COMPARISON:  02/25/2015 FINDINGS: Left Port-A-Cath remains in place, unchanged. Heart and mediastinal contours are within normal limits. No focal opacities or effusions. No acute bony abnormality. IMPRESSION: No active cardiopulmonary disease. Electronically Signed   By: Rolm Baptise M.D.   On: 03/10/2015 16:15   Dg Chest Portable 1 View  02/25/2015  CLINICAL DATA:  Shortness breath for past couple weeks, pain in ribs with deep breath, COPD, oxygen desaturation, diabetes mellitus, hypertension, COPD, multiple myeloma, smoker EXAM: PORTABLE CHEST 1 VIEW COMPARISON:  Portable exam 1438 hours compared 08/15/2014 FINDINGS: LEFT jugular Port-A-Cath with tip projecting over SVC. Normal heart size, mediastinal contours, and pulmonary vascularity. Atherosclerotic calcification aorta. Emphysematous changes with biapical scarring greater on RIGHT. No definite acute infiltrate, pleural effusion or pneumothorax. Diffuse osseous demineralization without discrete destructive lesion. Question nipple  shadows, not definitely seen on prior exams. IMPRESSION: COPD changes with biapical scarring. Question BILATERAL nipple shadow; repeat chest radiograph with nipple markers recommended to exclude pulmonary nodules. Electronically Signed   By: Lavonia Dana M.D.   On: 02/25/2015 14:57    ASSESSMENT: Progressive multiple myeloma, hypercalcemia, lymphadenopathy.  PLAN:    1. Multiple myeloma: Preliminary biopsy of lymphadenopathy consistent with  a plasma cell disorder, although final results are pending. This is highly unusual for multiple myeloma after lengthy discussion with the patient, he does not wish any further treatment and has elected to go home with hospice care.  2. Hypercalcemia: Resolved. Likely secondary to underlying malignancy.  3. Lymphadenopathy: Limited biopsy results as above. No further treatments planned.  4. Kidney lesion: Increasing in size. Highly suspicious for underlying malignancy. Case was discussed with urology and given the patient's declining performance status as well as comorbidities will continue to monitor. 5. Pain: Continue current narcotic regimen. XRT has completed. 6. Disposition: Home with hospice tomorrow.  DO NOT RESUSCITATE/DO NOT RESUSCITATE.    Lloyd Huger, MD   03/13/2015 3:23 PM

## 2015-03-13 NOTE — Care Management (Signed)
Admitted to [facility] with generalized weakness. Wife is Russell Keller [phone]). Sister is Russell Keller [phone]). Last seen Dr. Grayland Ormond prior to this admission. Chronic Home oxygen thru [facility]. Rolling walker and lift in the home. No falls, but looses balance easily.  Dr. Grayland Ormond spoke to family members and Russell Keller about Hospice services in the home. They are in agreement with these plans Discussed agencies with wife and sister. Russell Keller. Russell Floras, RN representative for [facility] updated. Discharge to home tomorrow per Dr. Heron Sabins. Shelbie Ammons RN MSN CCM Care Management [phone]

## 2015-03-13 NOTE — Progress Notes (Signed)
New referral for [facility] services at home following discharge received from [facility]. Patient was admitted to [facility] on 1/28 for evaluation of increased weakness and fatigue. He has a known history of multiple myeloma, plasmacytoma, COPD, HTN and DM II. Evaluation in the ED showed hypercalcemia and hypokalemia, he has received IV fluids and replacement potassium. He remains weak, with a poor appetite. Last WBC on 1/30 was 2.5, hemoglobin 8.8.  He has a kidney lesion that is suspicious for malignancy, with no intervention planned. Patient and his family met with oncologist Dr. Grayland Ormond and have chosen to forgo any further treatment for his progressive multiple myeloma and return home with hospice services. Patient is now a DNR. Writer met in the room with patient and his sister Jolayne [place], patient's wife was at home. Writer initiated education regarding hospice services, philosophy and team approach to care with good understanding voiced. Patient was intermittently confused and interactive through out visit, he denied pain. Family has requested a hospital bed to be delivered prior to patient's discharge tomorrow. Address and phone numbers verified. Patient information and DME needs faxed to referral. Hospital care team aware of and in agreement with discharge home tomorrow after bed is delivered. Plan is for transport home via car. Will continue to follow through final disposition. Thank you for the opportunity to be involved in the care of this patient and his family. Flo Shanks RN, BSN, [place] [facility] liaison [phone] c

## 2015-03-13 NOTE — Care Management Important Message (Signed)
Important Message  Patient Details  Name: Bradan Coopersmith MRN: [id number] Date of Birth: 02-Nov-1945   Medicare Important Message Given:  Yes    Juliann Pulse A Devinn Hurwitz 03/13/2015, 11:01 AM

## 2015-03-13 NOTE — Telephone Encounter (Signed)
The MD is talking about discharging him today, she has concerns because of his condition, he is weak, unable to ambulate, his arms are dropping, he is incontinent. She is asking for an MRI to be done to evaluate him. She wants a hospital bed and equipment in the home first. I asked her to speak to the social worker regarding needed equipment. She insists she get to speak with Dr Grayland Ormond before discharge happens

## 2015-03-13 NOTE — Plan of Care (Addendum)
Paged dr. To inform pt would like to sign a DNR. Pt has informed family that he's not going to live much longer.  Family informed.

## 2015-03-14 ENCOUNTER — Telehealth: Payer: Self-pay | Admitting: *Deleted

## 2015-03-14 ENCOUNTER — Ambulatory Visit: Payer: Commercial Managed Care - HMO

## 2015-03-14 LAB — CBC
HCT: 26.6 % — ABNORMAL LOW (ref 40.0–52.0)
Hemoglobin: 9 g/dL — ABNORMAL LOW (ref 13.0–18.0)
MCH: 31.7 pg (ref 26.0–34.0)
MCHC: 33.7 g/dL (ref 32.0–36.0)
MCV: 94.2 fL (ref 80.0–100.0)
PLATELETS: 137 10*3/uL — AB (ref 150–440)
RBC: 2.83 MIL/uL — AB (ref 4.40–5.90)
RDW: 14.9 % — AB (ref 11.5–14.5)
WBC: 5.3 10*3/uL (ref 3.8–10.6)

## 2015-03-14 LAB — GLUCOSE, CAPILLARY
GLUCOSE-CAPILLARY: 115 mg/dL — AB (ref 65–99)
Glucose-Capillary: 104 mg/dL — ABNORMAL HIGH (ref 65–99)

## 2015-03-14 MED ORDER — HALOPERIDOL LACTATE 5 MG/ML IJ SOLN: 2.0000 mg | Freq: Once | INTRAMUSCULAR | Status: DC

## 2015-03-14 MED ORDER — LORAZEPAM 2 MG/ML IJ SOLN
1.0000 mg | Freq: Once | INTRAMUSCULAR | Status: AC
  Administered 2015-03-14: 1 mg via INTRAVENOUS
  Filled 2015-03-14: qty 1

## 2015-03-14 NOTE — Telephone Encounter (Signed)
Wife has requested a foley be placed because she does not want to do laundry frequently due to his incontinence. Per Dr Grayland Ormond, see if patient ok with this and if hospice is willing to do cath insertion and care, it is fine to insert foley. Judson Roch states she will check with pt when she gores out and will go over possible UTI risks with foley with wife

## 2015-03-14 NOTE — Discharge Summary (Signed)
[place] [facility] [place] NAME: Russell Keller    MR#:  [id number]  DATE OF BIRTH:  1945-04-14  DATE OF ADMISSION:  03/10/2015 ADMITTING PHYSICIAN: Lytle Butte, MD  DATE OF DISCHARGE: 03/14/2015 12:30 PM  PRIMARY CARE PHYSICIAN: Halina Maidens, MD    ADMISSION DIAGNOSIS:  Dehydration [E86.0] Weakness [R53.1]  DISCHARGE DIAGNOSIS:  Active Problems:   Generalized weakness   Hypokalemia   Hypercalcemia   SECONDARY DIAGNOSIS:   Past Medical History  Diagnosis Date  . Diabetes mellitus without complication ([place])   . Cancer ([place])   . DVT (deep venous thrombosis) ([place])   . Hypertension   . COPD (chronic obstructive pulmonary disease) ([place])   . Diabetes mellitus without complication [facility])     Patient states he takes Glimeperide  . 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 Multiple myeloma [facility])     HOSPITAL COURSE:   70 year old male with past medical history of multiple myeloma, diabetes type 2 without, occasion, history of previous DVT, COPD, hypertension, chronic anemia who presented to the hospital with weakness and noted to be hypercalcemic and also hypokalemic.  #1 hypercalcemia-this was secondary to patient's underlying multiple myeloma and lytic lesions. -Patient was given IV fluids, Zometa and the calcium is improved and now normalized.   #2 hypokalemia-patient's potassium has been supplemented and since then resolved.  #3 lymphadenopathy-patient was noted to have significant lymphadenopathy on a CT done as an outpatient through her oncologist.  - Patient underwent a CT-guided lymph node biopsy while in the hospital the results of which are still pending and will continue follow-up with his oncologist Dr. Grayland Ormond is an outpatient. -The primary malignancy currently unclear..  #4 diabetes type 2 without complication-all in the hospital patient was maintained on sliding scale insulin. His by mouth intake has been somewhat poor and his  blood sugars have been stable and have taken him off his Amaryl for now.  #5 diabetic neuropathy-he will continue Neurontin.  #6 chronic pain-this was due to underlying multiple myeloma with lytic lesions. -he will Continue MS Contin, oxycodone as outpatient  #7 GERD-he will continue Protonix.  #8 COPD-he had no acute exacerbation in the hospital. He was maintained on DuoNeb's as needed. He will resume his albuterol nebulizers as outpatient.  #9. Upper Ext. Tremor/Weakness - this was likely multifactorial in nature related to his underlying hypercalcemia, deconditioning. Patient did undergo an MRI of his brain which showed lytic lesions in the calvarium consistent with his history of multiple myeloma. There was no parenchymal disease noted.  Patient's overall prognosis is poor given his recurrent malignancy. Patient's family opted for hospice services and he was discharged home with hospice services. Patient's oncologist Dr. Grayland Ormond was in agreement with this plan.  DISCHARGE CONDITIONS:   Stable  CONSULTS OBTAINED:  Treatment Team:  Lytle Butte, MD Lloyd Huger, MD  DRUG ALLERGIES:  No Known Allergies  DISCHARGE MEDICATIONS:   Discharge Medication List as of 03/14/2015 12:05 PM    CONTINUE these medications which have NOT CHANGED   Details  albuterol (PROVENTIL HFA;VENTOLIN HFA) 108 (90 BASE) MCG/ACT inhaler Inhale 2 puffs into the lungs every 6 (six) hours as needed for wheezing or shortness of breath., Starting 08/29/2014, Until Discontinued, Normal    baclofen (LIORESAL) 10 MG tablet TAKE 1 TABLET THREE TIMES DAILY AS NEEDED FOR MUSCLE SPASM(S), Normal    diazepam (VALIUM) 2 MG tablet Take 1 tablet (2 mg total) by mouth at bedtime as needed for  anxiety., Starting 02/07/2015, Until Discontinued, Print    gabapentin (NEURONTIN) 300 MG capsule Take 300 mg by mouth 2 (two) times daily. Reported on 03/06/2015, Until Discontinued, Historical Med    ibuprofen (ADVIL,MOTRIN)  800 MG tablet TAKE 1 TABLET THREE TIMES DAILY, Normal    ipratropium-albuterol (DUONEB) 0.5-2.5 (3) MG/3ML SOLN Take 3 mLs by nebulization every 6 (six) hours as needed., Starting 08/04/2014, Until Discontinued, Print    lidocaine-prilocaine (EMLA) cream Apply 1 application topically as needed. Apply to port then cover with saran wrap 1-2 hours before appointment, Starting 10/09/2014, Until Discontinued, Normal    Melatonin 5 MG TABS Take 5 mg by mouth at bedtime. , Until Discontinued, Historical Med    morphine (MS CONTIN) 15 MG 12 hr tablet Take 1 tablet (15 mg total) by mouth every 8 (eight) hours., Starting 02/22/2015, Until Discontinued, Print    omeprazole (PRILOSEC) 20 MG capsule Take 20 mg by mouth daily as needed (for indigestion/heartburn). , Until Discontinued, Historical Med    oxyCODONE (OXY IR/ROXICODONE) 5 MG immediate release tablet Take 1 tablet (5 mg total) by mouth every 4 (four) hours as needed for severe pain., Starting 02/20/2015, Until Discontinued, Print    prednisoLONE acetate (PRED FORTE) 1 % ophthalmic suspension Place 1 drop into the right eye 2 (two) times daily., Until Discontinued, Historical Med    prochlorperazine (COMPAZINE) 10 MG tablet Take 1 tablet (10 mg total) by mouth every 6 (six) hours as needed for nausea or vomiting. Reported on 03/06/2015, Starting 03/08/2015, Until Discontinued, Normal    valACYclovir (VALTREX) 500 MG tablet Take 500 mg by mouth daily., Until Discontinued, Historical Med    azelastine (ASTELIN) 0.1 % nasal spray Place 2 sprays into both nostrils 2 (two) times daily. Use in each nostril as directed, Starting 08/10/2014, Until Discontinued, Normal    dexamethasone (DECADRON) 4 MG tablet TAKE 5 TABLETS ONE TIME WEEKLY ON MONDAY , Normal    megestrol (MEGACE ES) 625 MG/5ML suspension Take 5 mLs (625 mg total) by mouth daily., Starting 03/08/2015, Until Discontinued, Normal      STOP taking these medications     glimepiride (AMARYL) 2 MG  tablet      predniSONE (DELTASONE) 20 MG tablet          DISCHARGE INSTRUCTIONS:   DIET:  Regular diet  DISCHARGE CONDITION:  Stable  ACTIVITY:  Activity as tolerated  OXYGEN:  Home Oxygen: Yes.     Oxygen Delivery: 2 liters/min via Patient connected to nasal cannula oxygen  DISCHARGE LOCATION:  Home with hospice   If you experience worsening of your admission symptoms, develop shortness of breath, life threatening emergency, suicidal or homicidal thoughts you must seek medical attention immediately by calling 911 or calling your MD immediately  if symptoms less severe.  You Must read complete instructions/literature along with all the possible adverse reactions/side effects for all the Medicines you take and that have been prescribed to you. Take any new Medicines after you have completely understood and accpet all the possible adverse reactions/side effects.   Please note  You were cared for by a hospitalist during your hospital stay. If you have any questions about your discharge medications or the care you received while you were in the hospital after you are discharged, you can call the unit and asked to speak with the hospitalist on call if the hospitalist that took care of you is not available. Once you are discharged, your primary care physician will handle any further medical issues.  Please note that NO REFILLS for any discharge medications will be authorized once you are discharged, as it is imperative that you return to your primary care physician (or establish a relationship with a primary care physician if you do not have one) for your aftercare needs so that they can reassess your need for medications and monitor your lab values.     Today   Lethargic/mildly encephalopathic as he received some pain meds this morning. Wife at bedside.  VITAL SIGNS:  Blood pressure 127/64, pulse 108, temperature 97.8 F (36.6 C), temperature source Oral, resp. rate 18, height  5' 10"  (1.778 m), weight 70.761 kg (156 lb), SpO2 94 %.  I/O:   Intake/Output Summary (Last 24 hours) at 03/14/15 1706 Last data filed at 03/14/15 0028  Gross per 24 hour  Intake      0 ml  Output    150 ml  Net   -150 ml    PHYSICAL EXAMINATION:    GENERAL: 70 y.o.-year-old patient lying in the bed lethargic/slightly encephalopathic.  EYES: Pupils equal, round, reactive to light. No scleral icterus. Extraocular muscles intact.  HEENT: Head atraumatic, normocephalic. Oropharynx and nasopharynx clear. Dry Oral mucosa.  NECK: Supple, no jugular venous distention. No thyroid enlargement, no tenderness.  LUNGS: Normal breath sounds bilaterally, no wheezing, rales, rhonchi. No use of accessory muscles of respiration.  CARDIOVASCULAR: S1, S2 normal. No murmurs, rubs, or gallops.  ABDOMEN: Soft, nontender, nondistended. Bowel sounds present. No organomegaly or mass.  EXTREMITIES: No cyanosis, clubbing or edema b/l.  NEUROLOGIC: Cranial nerves II through XII are intact. No focal Motor or sensory deficits b/l. Globally weak. + upper ext. Weakness tremor b/l.  PSYCHIATRIC: The patient is alert and oriented x 1. Encephalopathic SKIN: No obvious rash, lesion, or ulcer.   DATA REVIEW:   CBC  Recent Labs Lab 03/14/15 0435  WBC 5.3  HGB 9.0*  HCT 26.6*  PLT 137*    Chemistries   Recent Labs Lab 03/10/15 1626  03/12/15 0535  NA 139  < > 140  K 2.8*  < > 4.3  CL 103  < > 112*  CO2 30  < > 24  GLUCOSE 66  < > 171*  BUN 27*  < > 17  CREATININE 1.47*  < > 1.22  CALCIUM 11.5*  < > 9.8  MG 1.7  --  1.6*  AST 31  --   --   ALT 19  --   --   ALKPHOS 89  --   --   BILITOT 0.3  --   --   < > = values in this interval not displayed.  Cardiac Enzymes  Recent Labs Lab 03/10/15 1946  TROPONINI <0.03     RADIOLOGY:  Mr Kizzie Fantasia Contrast  03/13/2015  CLINICAL DATA:  70 year old diabetic hypertensive male with multiple myeloma presenting with generalize  weakness over the past few days. Initial encounter. EXAM: MRI HEAD WITHOUT AND WITH CONTRAST TECHNIQUE: Multiplanar, multiecho pulse sequences of the brain and surrounding structures were obtained without and with intravenous contrast. CONTRAST:  70m MULTIHANCE GADOBENATE DIMEGLUMINE 529 MG/ML IV SOLN COMPARISON:  10/02/2009 head CT.  No comparison brain MR. FINDINGS: Exam is significantly motion degraded. No acute infarct or intracranial hemorrhage Mild global atrophy without hydrocephalus. Mild chronic small vessel disease changes. Major intracranial vascular structures are patent. Diffuse calvarial and skullbase lesions consistent with involvement by myeloma. Index lesions: Superior medial left frontal convexity 4 x 3 x 4.2 cm lesion with  bony expansion. Mild dural enhancement. Soft tissue extension into the scalp. This is near but not extending into the superior sagittal sinus. Left temporal -frontal calvarium spanning over 5 x 6 x 7 cm with adjacent dural involvement. Dural involvement extends to the anterior left middle cranial fossa. Right parietal calvarial 3 x 4 x 2.5 cm lesion with bony expansion. Involvement of the greater wing of the sphenoid bilaterally. Because of motion, it is difficult to exclude extension into the lateral rectus muscles. Limited evaluation of the orbital apex because of motion. If there are any visual symptoms, CT may be considered Clivus/ skullbase involvement greater on the left. No obvious parenchymal enhancing lesion. Evaluation slightly limited by motion. There may be myeloma involvement of the upper cervical spine greater on the right. IMPRESSION: Exam is significantly motion degraded. No acute infarct or intracranial hemorrhage Mild global atrophy without hydrocephalus. Mild chronic small vessel disease changes. Diffuse calvarial and skullbase lesions consistent with involvement by myeloma with index lesions as detailed above. No obvious parenchymal enhancing lesion.  Evaluation slightly limited by motion. These results will be called to the ordering clinician or representative by the Radiologist Assistant, and communication documented in the PACS or zVision Dashboard. Electronically Signed   By: Genia Del M.D.   On: 03/13/2015 15:26      Management plans discussed with the patient, family and they are in agreement.  CODE STATUS:     Code Status Orders        Start     Ordered   03/13/15 1124  Do not attempt resuscitation (DNR)   Continuous    Question Answer Comment  In the event of cardiac or respiratory ARREST Do not call a "code blue"   In the event of cardiac or respiratory ARREST Do not perform Intubation, CPR, defibrillation or ACLS   In the event of cardiac or respiratory ARREST Use medication by any route, position, wound care, and other measures to relive pain and suffering. May use oxygen, suction and manual treatment of airway obstruction as needed for comfort.      03/13/15 1123    Code Status History    Date Active Date Inactive Code Status Order ID Comments User Context   03/13/2015 10:53 AM 03/13/2015 11:23 AM Partial Code [id number]  Herma Carson, RN Inpatient   03/12/2015 12:25 PM 03/13/2015 10:53 AM Full Code [id number]  Sabino Dick, MD Inpatient   03/10/2015  8:39 PM 03/12/2015 12:25 PM Full Code [id number]  Lytle Butte, MD ED   08/02/2014  7:53 PM 08/04/2014  6:33 PM Full Code [id number]  Dustin Flock, MD Inpatient   06/17/2014  9:11 PM 06/18/2014  8:05 PM Full Code [id number]  Lytle Butte, MD ED      TOTAL TIME TAKING CARE OF THIS PATIENT: 40 minutes.    Henreitta Leber M.D on 03/14/2015 at 5:06 PM  Between 7am to 6pm - Pager - [phone]  After 6pm go to [email or web address] - password [email or web address] [facility]  Office  [phone]  CC: Primary care physician; Halina Maidens, MD

## 2015-03-14 NOTE — Care Management (Signed)
Discharge to home today per Dr. Verdell Carmine. Will be followed by [facility]. Transportation will be arranged thru [facility]. Family members are in agreement Hassan Rowan s Matthew Saras RN MSN [place] [phone]

## 2015-03-14 NOTE — Progress Notes (Signed)
Follow up visit made to new referral for [facility] services at home following discharge. Patient seen lying in bed, asleep, wife Cecille Rubin out of the room, Probation officer spoke with her via phone. She reported that the bed would be delivered around 11:00 am this morning, she will advise when it is delivered, at that time staff RN Freddie Breech will be alerted to call EMS for transport. Patient has had another decline in function overnight, he is more lethargic and confused. He was able to take his oral medications in applesauce. Discharge medications reviewed, no prescriptions needed per Cecille Rubin, she reports that she has valium, MS contin 15 mg and oxycodone IR 5 mg available at home. Will continue to follow through final disposition. Flo Shanks RN, BSN, [facility] [place], hospital liaison [phone] c

## 2015-03-14 NOTE — Progress Notes (Signed)
Pt to go home with hospice  Via ems.  Discharge instructions discussed with  pts wife. Pt unable to  Comprehend info.  meds discussed.  Bed delvered at pts home.  Ems called.  Port deaccessed.  Ready for discharge.

## 2015-03-14 NOTE — Progress Notes (Addendum)
Per patient's wife Russell Keller bed has been delivered. EMS notified for pick up. Staff RN and patient's wife aware. Writer notified Hospice referral as well. Flo Shanks RN, BSN, [facility]

## 2015-03-15 ENCOUNTER — Telehealth: Payer: Self-pay | Admitting: *Deleted

## 2015-03-15 NOTE — Telephone Encounter (Signed)
I spoke with Clarise Cruz who reports that the patient has Oxycodone 5 on hand that he is on MS Contin which Cecille Rubin is only giving BID and not TID as ordered. She will discuss giving it TID as ordered and we will leave him on Oxycodone for now for breakthrough. She will call in the Ativan and notify wife to discontinue the Valium. Patient has refused indwelling foley cath and this has upset Cecille Rubin. He became sob last night while she and a family member were turning him and doing care and had taken his O2 off while doing this. He seems fine today

## 2015-03-15 NOTE — Telephone Encounter (Signed)
Ativan might work better.  He can have 1-2mg  ativan qhs prn.  Would not give both valium and ativan.  He can have meds for breakthrough, but i need to know what he has and what hospice has given him.

## 2015-03-15 NOTE — Telephone Encounter (Signed)
Called to report he had a bad nihgt last night with anxiety,pain and shortness of breath.  He has Valium 2 mg at hs and Cecille Rubin is requesting something in addition to that. Also is requesting breakthrough pain med be ordered

## 2015-03-16 ENCOUNTER — Telehealth: Payer: Self-pay | Admitting: *Deleted

## 2015-03-16 ENCOUNTER — Other Ambulatory Visit: Payer: Self-pay | Admitting: Family Medicine

## 2015-03-16 MED ORDER — MORPHINE SULFATE (CONCENTRATE) 20 MG/ML PO SOLN: 10.0000 mg | ORAL | Status: AC | PRN

## 2015-03-16 NOTE — Telephone Encounter (Signed)
RX faxed to [facility].

## 2015-03-16 NOTE — Telephone Encounter (Signed)
Hospice RN request refill of liquid morphine to be called into [facility] on [address].

## 2015-03-19 ENCOUNTER — Inpatient Hospital Stay: Payer: Commercial Managed Care - HMO | Admitting: Oncology

## 2015-03-19 LAB — SURGICAL PATHOLOGY

## 2015-03-21 ENCOUNTER — Ambulatory Visit: Payer: Self-pay | Admitting: Oncology

## 2015-03-26 ENCOUNTER — Other Ambulatory Visit: Payer: Self-pay | Admitting: Oncology

## 2015-04-11 DEATH — deceased

## 2016-02-20 IMAGING — CT CT ABD-PEL WO/W CM
2 of 10 series · 11 of 46 positions shown, 15 images · IV contrast (omnipaque)
Comparison: CT chest from 08/02/2014. Abdomen and pelvis CT from
05/17/2010.

CLINICAL DATA: Left renal mass seen on recent chest CT. Subsequent
encounter.

EXAM:
CT ABDOMEN AND PELVIS WITHOUT AND WITH CONTRAST
TECHNIQUE: Multidetector CT imaging of the abdomen and pelvis was performed
following the standard protocol before and following the bolus
administration of intravenous contrast.
CONTRAST:  100mL OMNIPAQUE IOHEXOL 350 MG/ML SOLN

[Series 5: renal nephrographic · axial · 0.73mm/px · z∈[-1013,-661]mm · 9 of 209 slices shown, 13 images]
[im 17/209  soft-tissue]
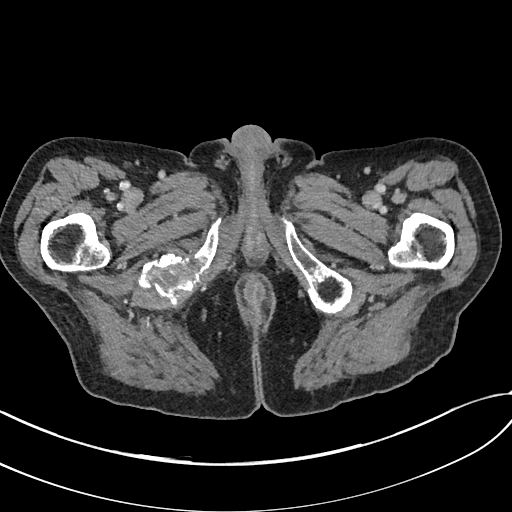
[im 17/209  bone]
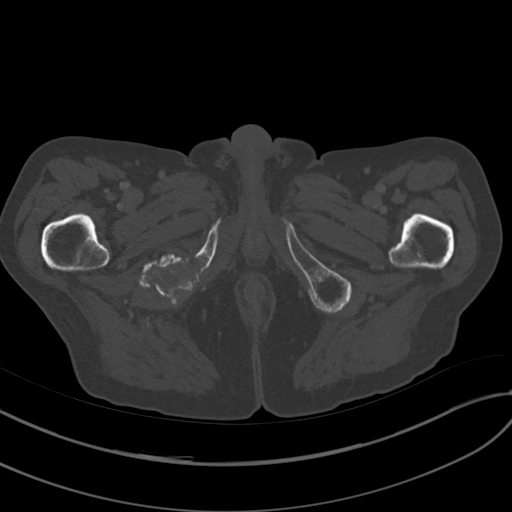
[im 49/209  soft-tissue]
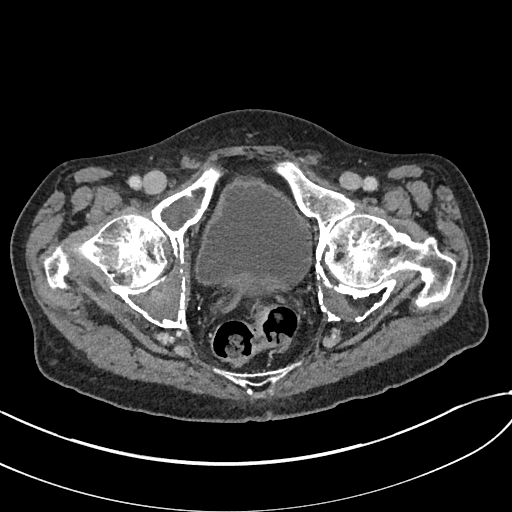
[im 65/209  soft-tissue]
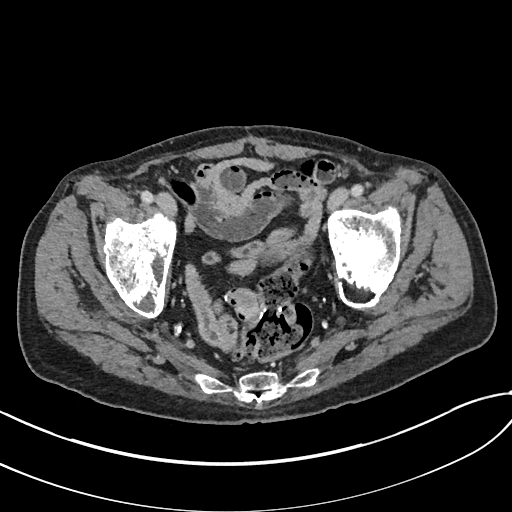
[im 97/209  soft-tissue]
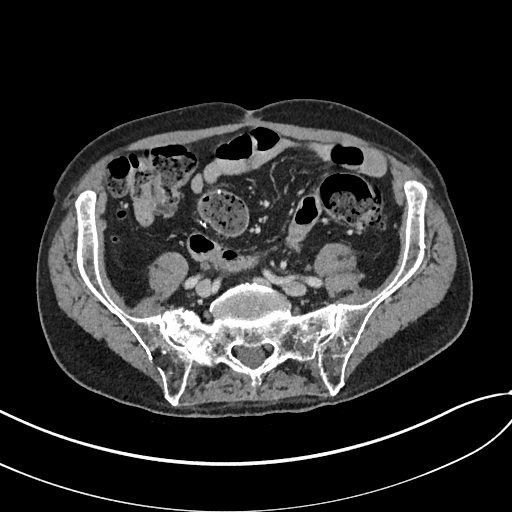
[im 113/209  soft-tissue]
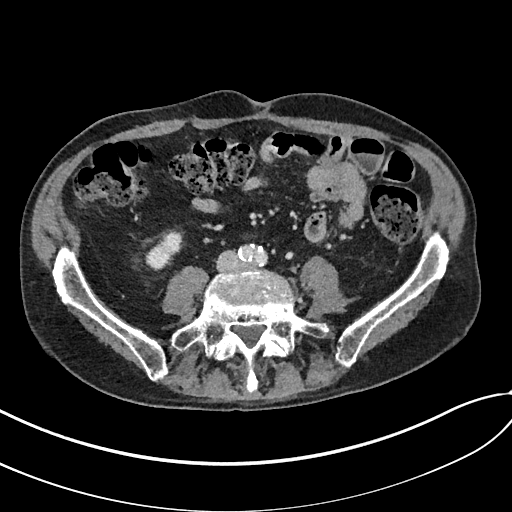
[im 145/209  soft-tissue]
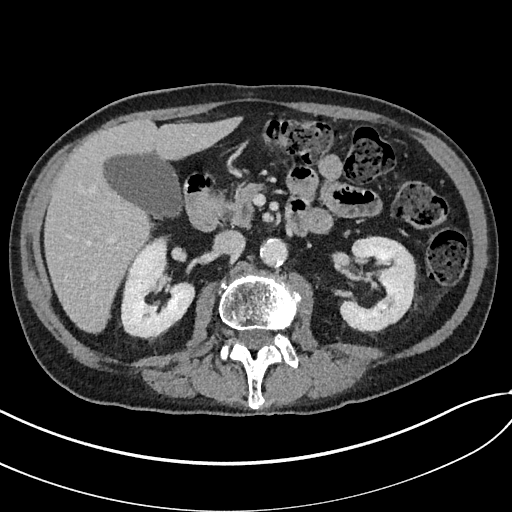
[im 145/209  lung]
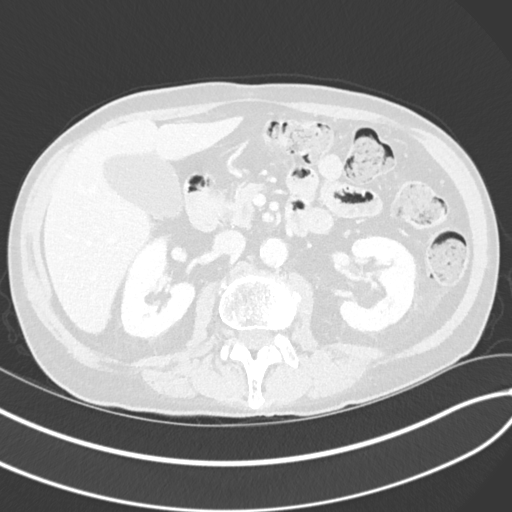
[im 161/209  soft-tissue]
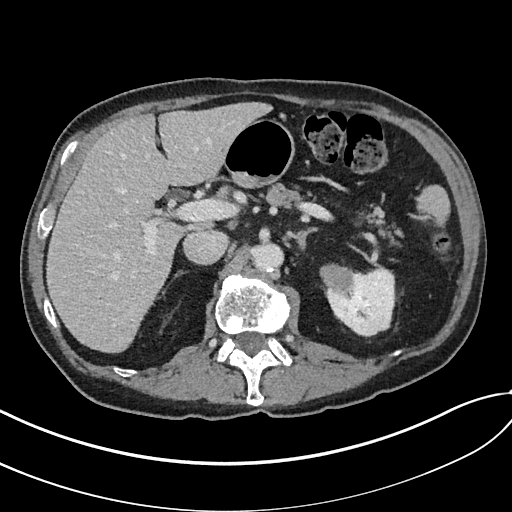
[im 161/209  lung]
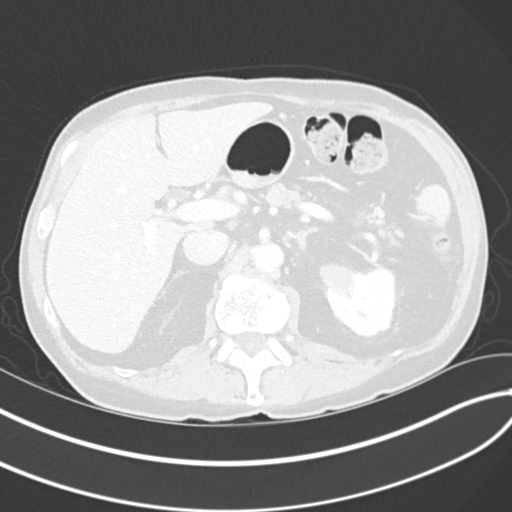
[im 177/209  lung]
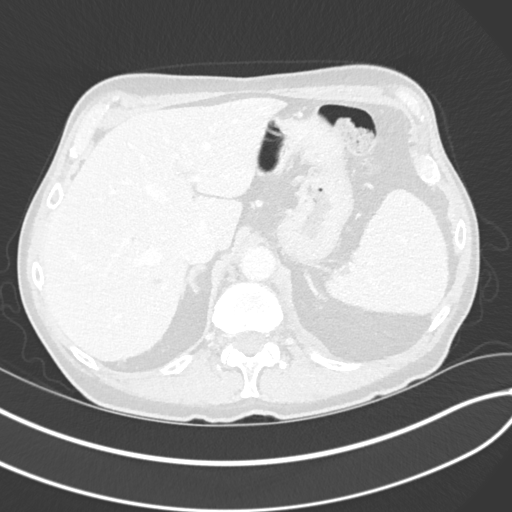
[im 193/209  soft-tissue]
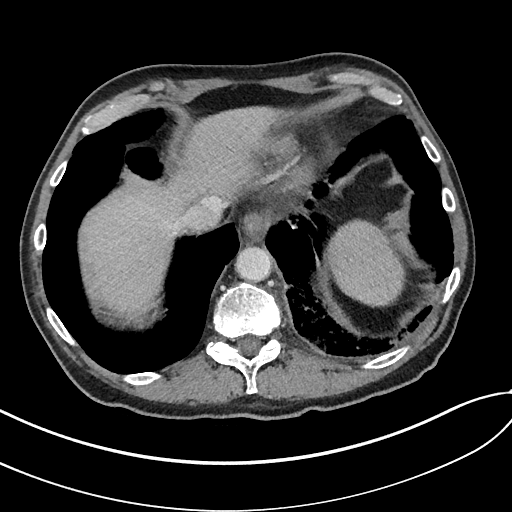
[im 193/209  lung]
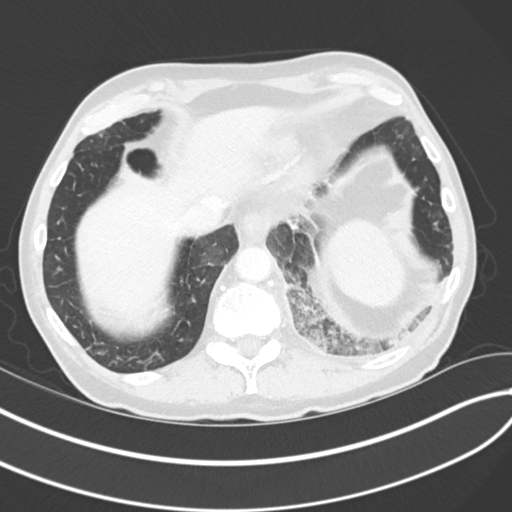

[Series 8: renal without cor · coronal · non-contrast · 0.43mm/px · 2 of 138 slices shown]
[im 46/138  soft-tissue]
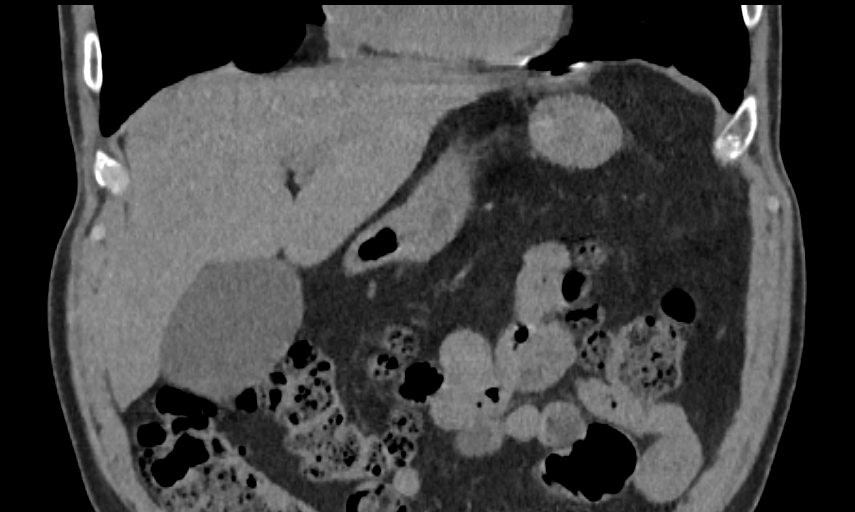
[im 92/138  soft-tissue]
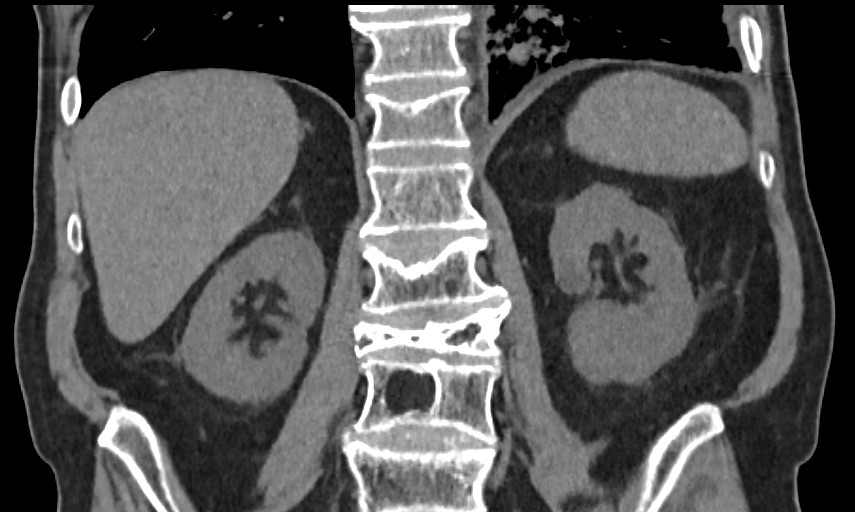

[11 of 46 positions shown; findings below may reference images not displayed]

FINDINGS: Lower chest: Patchy right lower lobe airspace disease seen
previously has improved in the interval, but there is new
interstitial and alveolar opacities in the medial left lung base,
suspicious for pneumonia. Small left pleural effusion is new in the
interval.

Hepatobiliary: Scattered tiny hypodensities in the liver parenchyma
are compatible with a cyst. 3 cm calcified gallstone is evident with
associated gallbladder distention, but no evidence gallbladder wall
thickening or pericholecystic fluid. Mild intrahepatic biliary duct
prominence is evident. The common bile duct measures 7 mm in
diameter at the level of the pancreatic head, upper normal for
patient age.

Pancreas: There is no evidence for gallstones, gallbladder wall
thickening, or pericholecystic fluid.

Spleen: No splenomegaly. No focal mass lesion.

Adrenals/Urinary Tract: No adrenal nodule or mass. Multiple tiny
hypo attenuating lesions are seen in the kidneys bilaterally, many
of which are too small to fully characterize. 3.0 x 2.5 cm exophytic
homogeneous lesion is seen emanating from the upper pole of the left
kidney. This has an average attenuation of about 46 Hounsfield units
on precontrast imaging and increases to 67 Hounsfield units on
postcontrast imaging. The lesion remains relatively homogeneous
after IV contrast administration. No evidence for hydronephrosis.
Left upper pole renal lesion does not extend into the central sinus
fat. The left renal vein courses anterior to the aorta and is patent
without filling defect.

No evidence for hydroureter.  Urinary bladder is unremarkable.

Stomach/Bowel: Stomach is nondistended. No gastric wall thickening.
No evidence of outlet obstruction. Duodenum is normally positioned
as is the ligament of Treitz. No small bowel wall thickening. No
small bowel dilatation. Terminal ileum is normal. The appendix is
normal. Small bowel anastomosis is seen in the central pelvis. There
is anastomotic suture line in the distal sigmoid colon.

Vascular/Lymphatic: There is abdominal aortic atherosclerosis
without aneurysm. No abdominal lymphadenopathy. No evidence for
pelvic sidewall lymphadenopathy.

Reproductive: Prostate gland and seminal vesicles are unremarkable.

Other: No intraperitoneal free fluid.

Musculoskeletal: Soft tissue replacement is seen bilaterally in the
lower bony pelvis with destruction of the right inferior pubic
ramus. This is compatible with multiple myeloma and was evaluated by
MRI on 03/14/2014. Bony involvement is seen and multiple vertebral
bodies and there is near complete collapse of L3 with superior
endplate compression deformity visible at T12, L2, and L5.
IMPRESSION: 1. 2.5 cm homogeneous exophytic lesion in the upper pole the left
kidney shows low level homogeneous enhancement after IV contrast
administration. This lesion has increased in size from 1.2 cm on the
study from 4 years ago. As such, imaging features are concerning for
neoplasm and papillary renal cell carcinoma would be a
consideration.
2. Interval development of patchy airspace disease in the left lower
lobe, suspicious for pneumonia. Previous is seen alveolar disease in
the right lower lobe has improved slightly in the interval.
3. Bony changes consistent with the reported history of multiple
myeloma.

## 2016-09-15 IMAGING — CT CT BIOPSY
1 of 3 series · 12 of 32 positions shown, 18 images · non-contrast
Comparison: none

INDICATION: Right retroperitoneal lymphadenopathy.

[Series 2: routine abdomen · axial · 0.77mm/px · z∈[+851,+1106]mm · 12 of 61 slices shown, 18 images]
[im 5/61  soft-tissue]
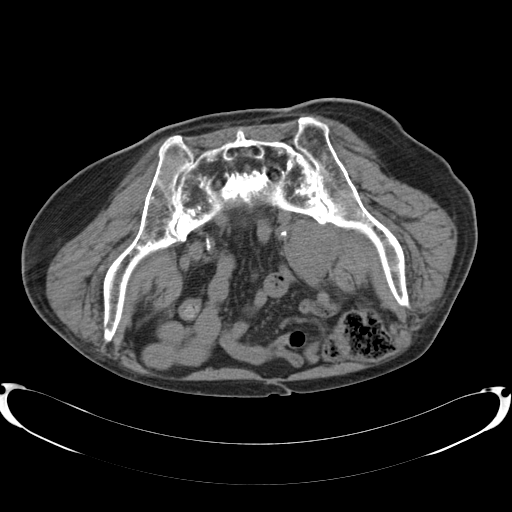
[im 5/61  bone]
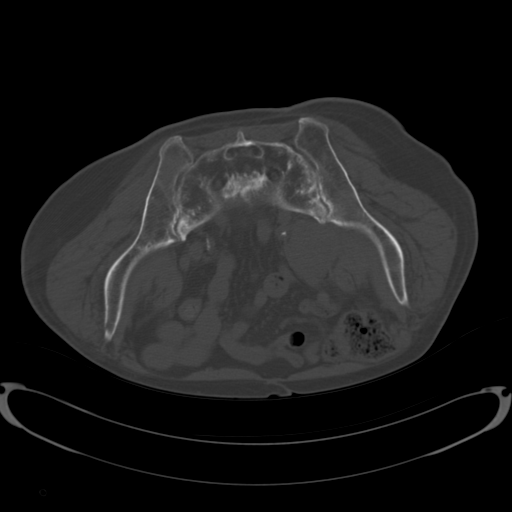
[im 10/61  soft-tissue]
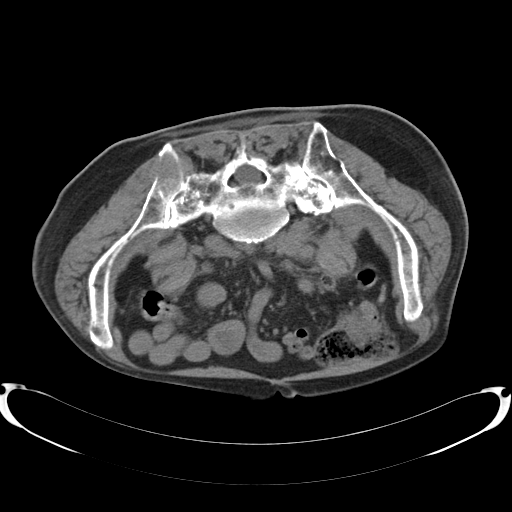
[im 14/61  soft-tissue]
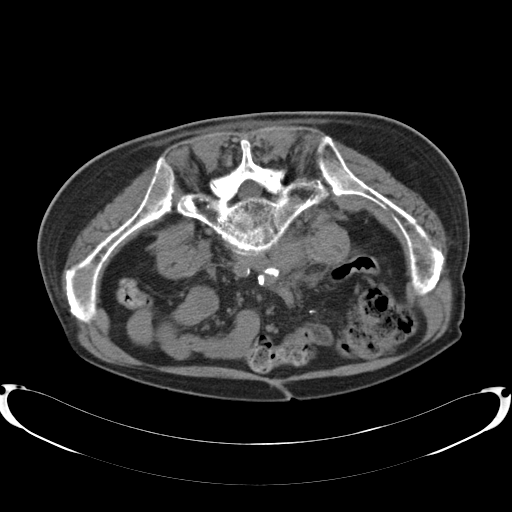
[im 19/61  soft-tissue]
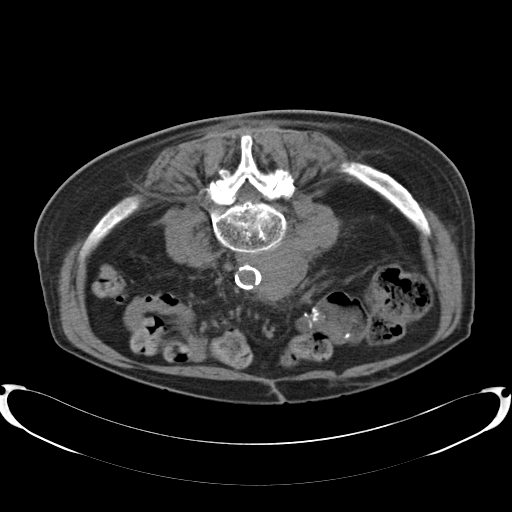
[im 24/61  soft-tissue]
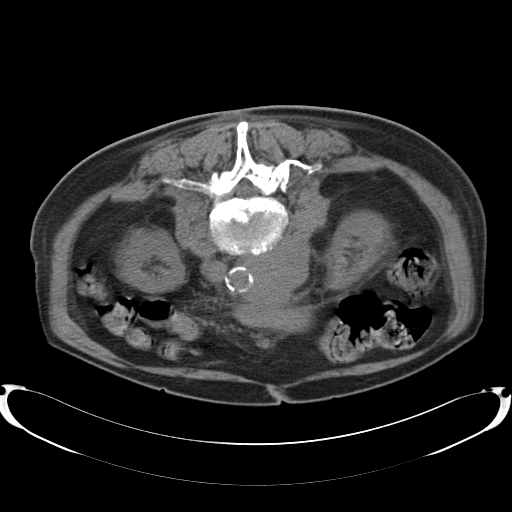
[im 28/61  soft-tissue]
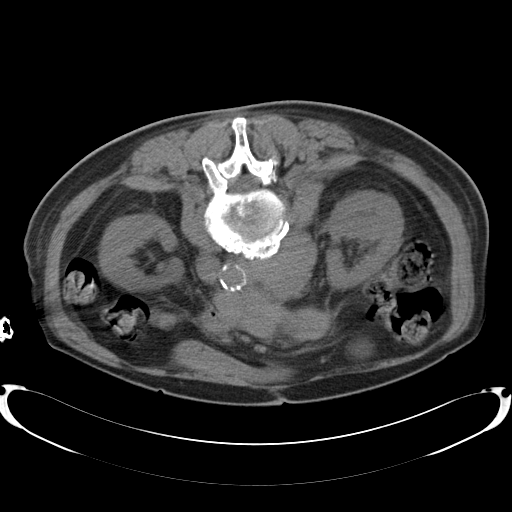
[im 33/61  soft-tissue]
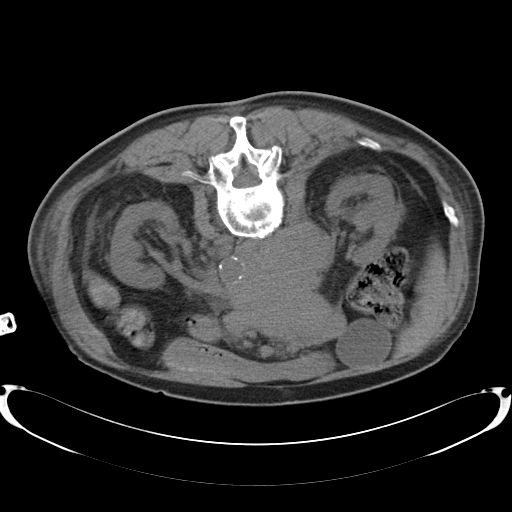
[im 37/61  soft-tissue]
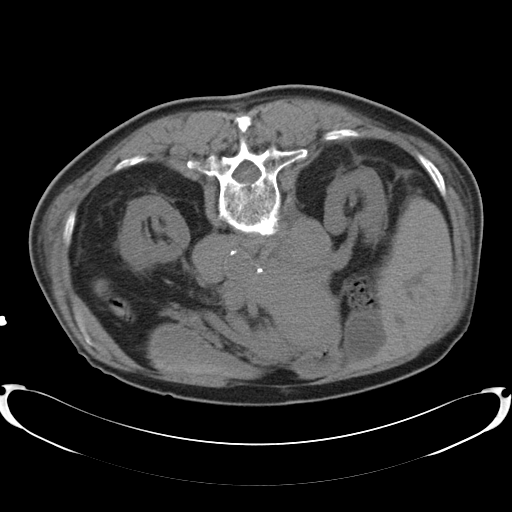
[im 42/61  soft-tissue]
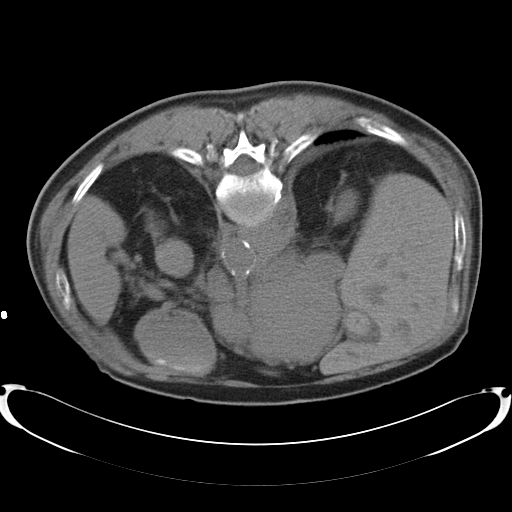
[im 42/61  lung]
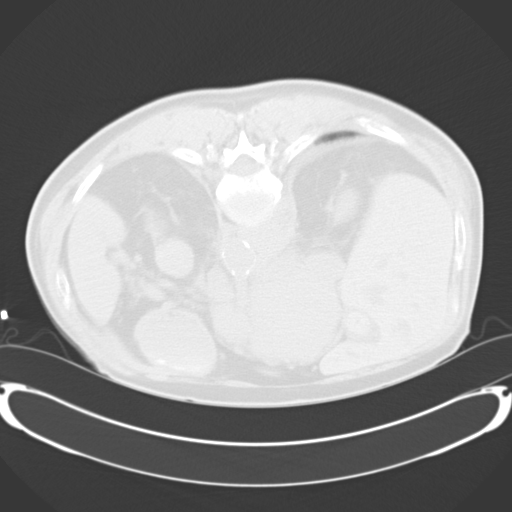
[im 42/61  bone]
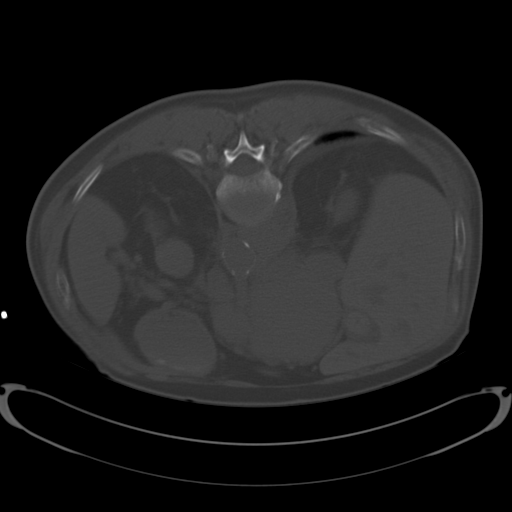
[im 47/61  soft-tissue]
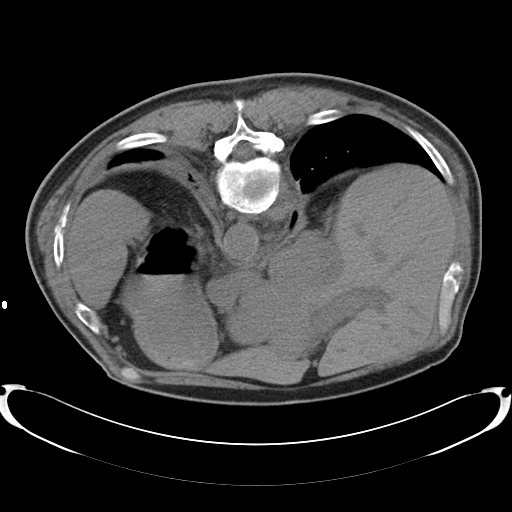
[im 47/61  lung]
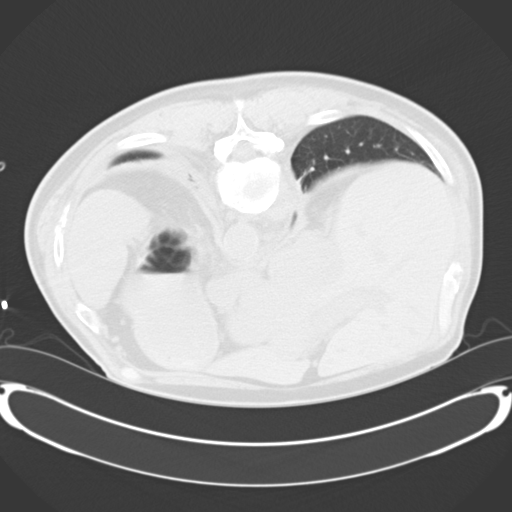
[im 51/61  soft-tissue]
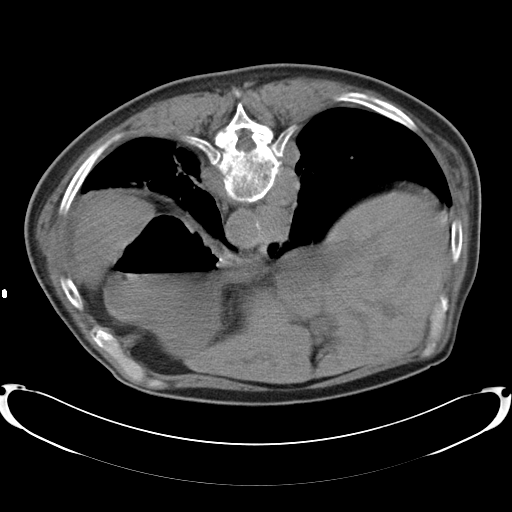
[im 51/61  lung]
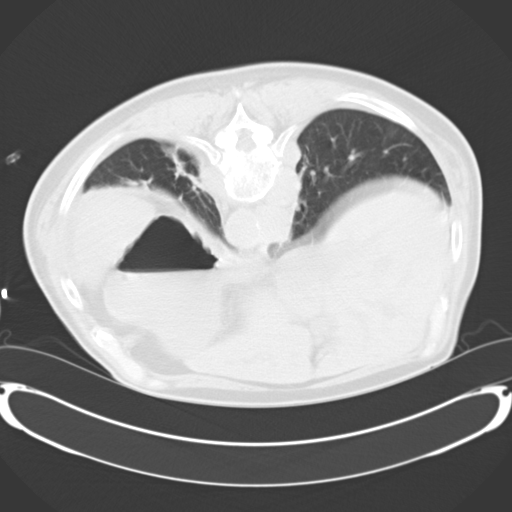
[im 56/61  soft-tissue]
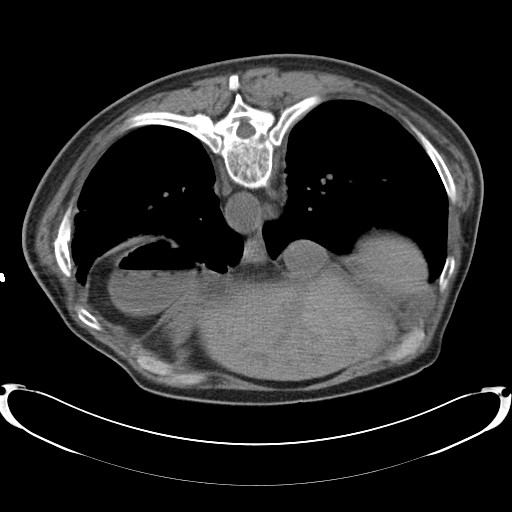
[im 56/61  lung]
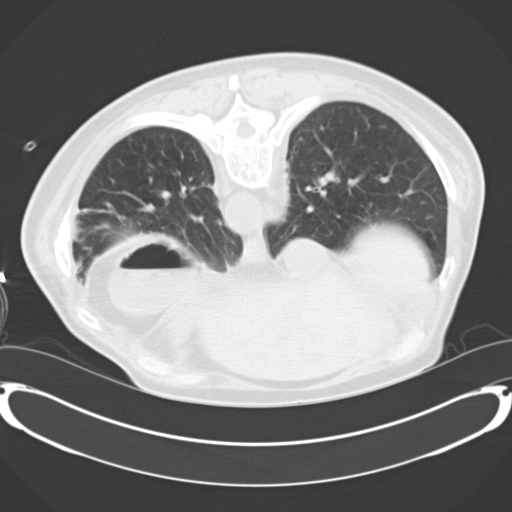

[12 of 32 positions shown; findings below may reference images not displayed]

EXAM:
CT BIOPSY

MEDICATIONS:
None.

ANESTHESIA/SEDATION:
Fentanyl 50 mcg IV; Versed 2.5 mg IV

Moderate Sedation Time:  25

The patient was continuously monitored during the procedure by the
interventional radiology nurse under my direct supervision.

COMPLICATIONS:
None immediate.

PROCEDURE:
Informed written consent was obtained from the patient's wife after
a thorough discussion of the procedural risks, benefits and
alternatives. All questions were addressed. Maximal Sterile Barrier
Technique was utilized including mask, sterile gloves, sterile
drape, hand hygiene and skin antiseptic. A timeout was performed
prior to the initiation of the procedure.

Patient was placed prone on the CT scanner. Right flank region was
prepped and draped using sterile technique. Local anesthetic was
applied. Under CT guidance, 17 gauge guiding needle was directed
toward the margin of right retroperitoneal adenopathy using
paraspinal approach. Five core samples were obtained using 18 gauge
biopsy needle. The samples were placed in sterile saline and
delivered to pathology. Needle was removed. Appropriate dressing was
applied. No immediate complications were noted on postprocedure
images.
IMPRESSION: Under CT guidance, percutaneous biopsy of right retroperitoneal
adenopathy was performed.
